# Patient Record
Sex: Female | Born: 1935 | Race: Black or African American | Hispanic: No | Smoking: Former smoker
Health system: Southern US, Community
[De-identification: ages and names within clinical notes are randomized; demographics above are authoritative.]

## PROBLEM LIST (undated history)

## (undated) DIAGNOSIS — Z923 Personal history of irradiation: Secondary | ICD-10-CM

## (undated) DIAGNOSIS — D649 Anemia, unspecified: Secondary | ICD-10-CM

## (undated) DIAGNOSIS — R918 Other nonspecific abnormal finding of lung field: Secondary | ICD-10-CM

## (undated) DIAGNOSIS — Z9221 Personal history of antineoplastic chemotherapy: Secondary | ICD-10-CM

## (undated) DIAGNOSIS — R0602 Shortness of breath: Secondary | ICD-10-CM

## (undated) DIAGNOSIS — C169 Malignant neoplasm of stomach, unspecified: Secondary | ICD-10-CM

## (undated) DIAGNOSIS — J449 Chronic obstructive pulmonary disease, unspecified: Secondary | ICD-10-CM

## (undated) DIAGNOSIS — E785 Hyperlipidemia, unspecified: Secondary | ICD-10-CM

## (undated) DIAGNOSIS — M1990 Unspecified osteoarthritis, unspecified site: Secondary | ICD-10-CM

## (undated) HISTORY — DX: Unspecified osteoarthritis, unspecified site: M19.90

## (undated) HISTORY — PX: PORTACATH PLACEMENT: SHX2246

## (undated) HISTORY — DX: Personal history of irradiation: Z92.3

## (undated) HISTORY — PX: OTHER SURGICAL HISTORY: SHX169

## (undated) HISTORY — DX: Personal history of antineoplastic chemotherapy: Z92.21

## (undated) HISTORY — DX: Anemia, unspecified: D64.9

## (undated) HISTORY — DX: Chronic obstructive pulmonary disease, unspecified: J44.9

## (undated) HISTORY — PX: CATARACT EXTRACTION: SUR2

## (undated) HISTORY — PX: CHOLECYSTECTOMY: SHX55

## (undated) HISTORY — PX: EYE SURGERY: SHX253

## (undated) HISTORY — PX: ABDOMINAL HYSTERECTOMY: SHX81

## (undated) HISTORY — DX: Malignant neoplasm of stomach, unspecified: C16.9

## (undated) HISTORY — DX: Hyperlipidemia, unspecified: E78.5

---

## 1998-08-14 ENCOUNTER — Emergency Department (HOSPITAL_COMMUNITY): Admission: EM | Admit: 1998-08-14 | Discharge: 1998-08-14 | Payer: Self-pay | Admitting: Emergency Medicine

## 1998-10-12 ENCOUNTER — Other Ambulatory Visit: Admission: RE | Admit: 1998-10-12 | Discharge: 1998-10-12 | Payer: Self-pay | Admitting: Internal Medicine

## 1999-10-10 ENCOUNTER — Inpatient Hospital Stay (HOSPITAL_COMMUNITY): Admission: AD | Admit: 1999-10-10 | Discharge: 1999-10-13 | Payer: Self-pay | Admitting: Internal Medicine

## 1999-10-31 ENCOUNTER — Ambulatory Visit (HOSPITAL_COMMUNITY): Admission: RE | Admit: 1999-10-31 | Discharge: 1999-10-31 | Payer: Self-pay | Admitting: Internal Medicine

## 2000-10-08 ENCOUNTER — Other Ambulatory Visit: Admission: RE | Admit: 2000-10-08 | Discharge: 2000-10-08 | Payer: Self-pay | Admitting: Internal Medicine

## 2000-10-14 ENCOUNTER — Encounter: Payer: Self-pay | Admitting: Internal Medicine

## 2000-10-14 ENCOUNTER — Encounter: Admission: RE | Admit: 2000-10-14 | Discharge: 2000-10-14 | Payer: Self-pay | Admitting: Internal Medicine

## 2000-10-21 ENCOUNTER — Encounter: Admission: RE | Admit: 2000-10-21 | Discharge: 2000-10-21 | Payer: Self-pay | Admitting: Internal Medicine

## 2000-10-21 ENCOUNTER — Encounter: Payer: Self-pay | Admitting: Internal Medicine

## 2001-10-25 ENCOUNTER — Encounter: Admission: RE | Admit: 2001-10-25 | Discharge: 2001-10-25 | Payer: Self-pay | Admitting: Internal Medicine

## 2001-10-25 ENCOUNTER — Encounter: Payer: Self-pay | Admitting: Internal Medicine

## 2002-10-27 ENCOUNTER — Encounter: Payer: Self-pay | Admitting: Internal Medicine

## 2002-10-27 ENCOUNTER — Encounter: Admission: RE | Admit: 2002-10-27 | Discharge: 2002-10-27 | Payer: Self-pay | Admitting: Internal Medicine

## 2003-11-15 ENCOUNTER — Encounter: Admission: RE | Admit: 2003-11-15 | Discharge: 2003-11-15 | Payer: Self-pay | Admitting: Internal Medicine

## 2003-12-27 ENCOUNTER — Ambulatory Visit (HOSPITAL_COMMUNITY): Admission: RE | Admit: 2003-12-27 | Discharge: 2003-12-27 | Payer: Self-pay | Admitting: Gastroenterology

## 2003-12-27 ENCOUNTER — Encounter (INDEPENDENT_AMBULATORY_CARE_PROVIDER_SITE_OTHER): Payer: Self-pay | Admitting: *Deleted

## 2005-01-28 ENCOUNTER — Emergency Department (HOSPITAL_COMMUNITY): Admission: EM | Admit: 2005-01-28 | Discharge: 2005-01-28 | Payer: Self-pay | Admitting: Emergency Medicine

## 2008-11-06 ENCOUNTER — Emergency Department (HOSPITAL_COMMUNITY): Admission: EM | Admit: 2008-11-06 | Discharge: 2008-11-06 | Payer: Self-pay | Admitting: Family Medicine

## 2008-12-08 ENCOUNTER — Encounter: Admission: RE | Admit: 2008-12-08 | Discharge: 2008-12-08 | Payer: Self-pay | Admitting: Internal Medicine

## 2009-12-21 ENCOUNTER — Encounter: Admission: RE | Admit: 2009-12-21 | Discharge: 2009-12-21 | Payer: Self-pay | Admitting: Internal Medicine

## 2010-08-11 LAB — HEMOCCULT GUIAC POC 1CARD (OFFICE): Fecal Occult Bld: POSITIVE

## 2010-09-20 NOTE — Op Note (Signed)
NAMEDONJA, [name]                       ACCOUNT NO.:  [id number]   MEDICAL RECORD NO.:  [id number]                   PATIENT TYPE:  AMB   LOCATION:  ENDO                                 FACILITY:  [facility]   PHYSICIAN:  Danise Edge, M.D.                DATE OF BIRTH:  Apr 10, 1931   DATE OF PROCEDURE:  12/27/2003  DATE OF DISCHARGE:                                 OPERATIVE REPORT   PROCEDURE:  Screening colonoscopy.   PROCEDURE INDICATION:  Ms. [name] is a 77 year old female, born  1931/05/02.  [name] is scheduled to undergo her first screening  colonoscopy with polypectomy to prevent colon cancer.   ENDOSCOPIST:  Danise Edge, M.D.   PREMEDICATION:  1. Demerol 60 mg.  2. Versed 5 mg.   DESCRIPTION OF PROCEDURE:  After obtaining informed consent, [name] was  placed in the left lateral decubitus position.  I administered intravenous  Demerol and intravenous Versed to achieve conscious sedation for the  procedure.  The patient's blood pressure, oxygen saturation, and cardiac  rhythm were monitored throughout the procedure and documented in the medical  record.   Anal inspection and digital rectal exam were normal.  The Olympus adjustable  pediatric colonoscope was introduced into the rectum and advanced to the  cecum.  Colonic preparation for the exam today was excellent.   RECTUM:  Normal.  SIGMOID COLON AND DESCENDING COLON:  At 60 cm from the anal verge, a  diminutive 1 mm sessile polyp was removed with the cold biopsy forceps.  SPLENIC FLEXURE:  Normal.  TRANSVERSE COLON:  Normal.  HEPATIC FLEXURE:  Normal.  ASCENDING COLON:  Normal.  CECUM AND ILEOCECAL VALVE:  From the distal cecum, a 1 cm flat polyp was  lifted by submucosal saline injection and removed in piecemeal fashion with  the electrocautery snare.   ASSESSMENT:  A 1 cm flat polyp was removed from the distal cecum with the  electrocautery snare, and a diminutive 1 mm polyp  removed from the left  colon at 60 cm from the anal verge.                                               Danise Edge, M.D.    MJ/MEDQ  D:  12/27/2003  T:  12/27/2003  Job:  [id number]   cc:   [name], M.D.  [address]  [place]  [place] [address]  Fax: [phone]

## 2010-09-20 NOTE — H&P (Signed)
[place]  Patient:    [name], [name]                      MRN: [id number] Attending:  Norva Pavlov, M.D.                         History and Physical  CHIEF COMPLAINT:  Shortness of breath for last one week.  HISTORY OF PRESENT ILLNESS:  Ms. Costen is a 77 year old African-American female patient with a history of tobacco use although she has quit smoking since January of this year and hypertension who has been feeling poorly since last week. She visited [place] two weeks back and felt "cold" on the bus. For the last one week, she has had cough productive of clear to white sputum and dyspnea on exertion which is progressively getting worse. No orthopnea or PND. Dyspnea and cough kept her up at night, and she decided to call the office today. She denies fever, chills, but stays "hot". No anorexia, weight loss, hemoptysis, or night sweats. She has been wheezing and denies any history of asthma. She does have nasal congestion and post nasal drip. No sinus congestion. She did try Tavist-1 and Robitussin DM with not much relief.  PAST MEDICAL HISTORY:  Significant for: 1. Hypertension. 2. Tobacco use. 3. Post menopausal and has refused HRT. 4. Probable tarsal tunnel syndrome.  PAST SURGICAL HISTORY:  Significant for: 1. Complete hysterectomy with bilateral salpingo-oophorectomy secondary to    fibroid uterus in 1973. 2. Cholecystectomy in 1981. 3. Lipoma removal in 1989. 4. Cataract extraction with IOL in the right eye by Robert L. Dione Booze, M.D.  INJURIES: 1. Fracture of the left arm in 1969. 2. Had an injury of the left foot after a vacuum fell on her last year.  FAMILY HISTORY:  Father died at 65 of pneumonia. Mother 38 has hypertension and atrial fibrillation. Brothers 55, 56, 58, and 59; they are all healthy.  SOCIAL HISTORY:  She has been a smoker, smoking about a pack a day for the last 40 years. Did quit in January of this  year after being tried on Nicotrol inhaler and Zyban. Alcohol:  Two glasses of whiskey a week. Caffeine:  Two to four a day. Recreational drugs:  None. She is married, does not have any children. Job:  She is retired, worked in the Web designer at [employer].  MENSTRUAL HISTORY:  She is postmenopausal who has refused estrogen replacement treatment because of fear of breast cancer and was tried on Evista which she discontinued taking on her own.  REVIEW OF SYSTEMS:  All other systems are unremarkable.  PHYSICAL EXAMINATION:  VITAL SIGNS:  Height is 4 feet, 11 1/4 inches; weight 125, temperature 98.1, pulse is 80 a minute, blood pressure 120/84 right arm sitting, oxygen saturations were 80% on room air.  GENERAL:  Appears in mild respiratory distress.  HEENT:  EOMs are intact. Left early cataract, right eye has an intraocular lens. Ear exam unremarkable. Oropharyngeal mucosa is normal. No erythema or exudate.  NECK:  Supple. No JVD. No enlarged lymphadenopathy or thyromegaly.  CHEST:  With coarse breath sounds with expiratory wheezing bilateral.  CARDIOVASCULAR:  Regular rate and rhythm. No murmur, gallop, rub, or click.  ABDOMEN:  Soft, nontender, nondistended. Bowel sounds are good. No hepatosplenomegaly.  EXTREMITIES:  With clubbing, cyanosis, or edema. Distal pulses are good.  SKIN:  Without any rash.  PELVIC/BREASTS/RECTAL:  Deferred.  LABORATORY DATA:  Pending.  Chest x-ray PA and lateral done in the office was read by the radiologist in [facility] and showed emphysematous lungs with chronic and maybe acute on chronic bronchitic changes. No infiltrate, mass, or effusion.  ASSESSMENT:  Cough with dyspnea. Most likely this is chronic obstructive pulmonary disease exacerbation although Ms. Legaspi has never had formal pulmonary function tests with lung volume done in the past and has been told that she has chronic bronchitis. She was a heavy smoker who has quit smoking only in  January of this year after a 40-pack-year history. In the office, she was given albuterol nebulizer 2.5 x 2 and oxygen saturations were 84% after these breathing treatments. She was put on oxygen supplements, and the decision was made to hospitalize her.  PLAN:  Extra oxygen supplementation to keep her saturations above 93, IV steroids, IV bronchodilators, monitor her peak flows before and after the bronchodilator treatments, empiric antibiotic treatment with IV Levaquin, switch her steroids and antibiotics to p.o. as soon as she becomes better. She will need pulmonary function tests with lung volumes and ______ once she isstable.DD:  10/10/99 TD:  10/10/99 Job: [id number]

## 2010-09-20 NOTE — Discharge Summary (Signed)
[place]  Patient:    [name], [name]                    MRN: [id number] Adm. Date:  [id number] Disc. Date: [id number] Attending:  Dutch Gray Dictator:   Thora Lance, M.D.                           Discharge Summary  REASON FOR ADMISSION:  This is a 77 year old African-American female with a history of tobacco use and hypertension who had been feeling poorly for a week.  She had complained of a cough productive of clear to white sputum, and dyspnea on exertion which has progressively gotten worse.  She denied any fevers or chills, weight loss, hemoptysis, anorexia, or night sweats.  She had complained of wheezing, but denies any history of asthma.  She had some nasal congestion with postnasal drip.  She has a 40 pack year history of smoking, and quit in January 2001.  PHYSICAL EXAMINATION:  GENERAL:  She appeared to be in mild respiratory distress at rest.  VITAL SIGNS:  Blood pressure was 120/84, heart rate was 80, temperature 98.1, oxygen saturation was 80% on room air.  CHEST:  Coarse breath sounds with expiratory wheezing bilaterally.  The rest of her examination was unremarkable.  LABORATORY DATA:  Chest x-ray showed emphysematous changes which are chronic, and there may some acute on chronic bronchitic changes.  There was no infiltrate, effusion, or mass.  CBC showed white blood cell count of 5.9, hemoglobin 15.7, platelet count 212 with a normal diff.  Arterial blood gas on room air pH 7.36, PCO2 49, PO2 65.  CMET not available to me at the time of this dictation.  EKG showed normal sinus rhythm, some nonspecific inferolateral T-wave changes.  HOSPITAL COURSE:  Problem #1 - CHRONIC OBSTRUCTIVE PULMONARY DISEASE EXACERBATION:  The patient was admitted and placed on intravenous steroids, intravenous Levaquin, albuterol and Atrovent nebulizers, guaifenesin, and oxygen.  By the second hospital day she felt much better.  Her  intravenous fluids were discontinued, she was taking p.o., and she was switched from intravenous to oral Levaquin.  Her Solu-Medrol dose was decreased.  By the second hospital day she continued to improve with less cough and less shortness of breath.  Her steroids were switched from intravenous to oral. Her oxygen was weaned, and on the day of discharge her oxygen saturation was 94% on room air.  Her wheezing had improved.  She was discharged in good condition.  Problem #2 - HYPERTENSION:  She remained on her hydrochlorothiazide throughout the hospitalization, and her blood pressures were normal.  DISCHARGE DIAGNOSES: 1. Chronic obstructive pulmonary disease exacerbation. 2. Hypertension.  PROCEDURES:  None.  DISCHARGE MEDICATIONS: 1. Levaquin 500 mg p.o. q.d. x 5 days. 2. Prednisone 50 mg q.d. one day, 40 mg q.d. two days, 30 mg q.d. two days,    20 mg q.d. two days, 10 mg q.d. two days, and then discontinue. 3. Albuterol metered dose inhaler with aerochamber 2 puffs q.i.d. 4. Guaifenesin 1200 mg one b.i.d. x 5 days. 5. Hydrochlorothiazide 12.5 mg q.d.  ACTIVITY:  No restrictions.  DIET:  No restrictions.  FOLLOWUP:  Approximately ten days with Dr. Baron Hamper.  The patient has already discontinued smoking.  We may want to obtain pulmonary function tests as an outpatient after this is cleared. DD:  10/13/99 TD:  10/16/99 Job: [id number]

## 2010-09-23 ENCOUNTER — Observation Stay (HOSPITAL_COMMUNITY)
Admission: EM | Admit: 2010-09-23 | Discharge: 2010-09-25 | Disposition: A | Discharge status: Home / Self Care | Payer: Medicare Other | Attending: Internal Medicine | Admitting: Internal Medicine

## 2010-09-23 ENCOUNTER — Inpatient Hospital Stay (INDEPENDENT_AMBULATORY_CARE_PROVIDER_SITE_OTHER)
Admission: RE | Admit: 2010-09-23 | Discharge: 2010-09-23 | Disposition: A | Discharge status: Home / Self Care | Payer: Medicare Other | Source: Ambulatory Visit | Attending: Family Medicine | Admitting: Family Medicine

## 2010-09-23 DIAGNOSIS — D500 Iron deficiency anemia secondary to blood loss (chronic): Secondary | ICD-10-CM | POA: Insufficient documentation

## 2010-09-23 DIAGNOSIS — R195 Other fecal abnormalities: Secondary | ICD-10-CM | POA: Insufficient documentation

## 2010-09-23 DIAGNOSIS — J449 Chronic obstructive pulmonary disease, unspecified: Secondary | ICD-10-CM | POA: Insufficient documentation

## 2010-09-23 DIAGNOSIS — E785 Hyperlipidemia, unspecified: Secondary | ICD-10-CM | POA: Insufficient documentation

## 2010-09-23 DIAGNOSIS — Z79899 Other long term (current) drug therapy: Secondary | ICD-10-CM | POA: Insufficient documentation

## 2010-09-23 DIAGNOSIS — J4489 Other specified chronic obstructive pulmonary disease: Secondary | ICD-10-CM | POA: Insufficient documentation

## 2010-09-23 DIAGNOSIS — M1990 Unspecified osteoarthritis, unspecified site: Secondary | ICD-10-CM | POA: Insufficient documentation

## 2010-09-23 DIAGNOSIS — D131 Benign neoplasm of stomach: Principal | ICD-10-CM | POA: Insufficient documentation

## 2010-09-23 DIAGNOSIS — D649 Anemia, unspecified: Secondary | ICD-10-CM

## 2010-09-23 DIAGNOSIS — R42 Dizziness and giddiness: Secondary | ICD-10-CM

## 2010-09-23 DIAGNOSIS — R933 Abnormal findings on diagnostic imaging of other parts of digestive tract: Secondary | ICD-10-CM | POA: Insufficient documentation

## 2010-09-23 LAB — POCT I-STAT, CHEM 8
Chloride: 106 mEq/L (ref 96–112)
Glucose, Bld: 89 mg/dL (ref 70–99)
HCT: 20 % — ABNORMAL LOW (ref 36.0–46.0)
Hemoglobin: 6.8 g/dL — CL (ref 12.0–15.0)
Potassium: 3.8 mEq/L (ref 3.5–5.1)
Sodium: 139 mEq/L (ref 135–145)

## 2010-09-23 LAB — ABO/RH: ABO/RH(D): O POS

## 2010-09-24 ENCOUNTER — Other Ambulatory Visit: Payer: Self-pay | Admitting: Gastroenterology

## 2010-09-24 ENCOUNTER — Inpatient Hospital Stay (HOSPITAL_COMMUNITY): Payer: Medicare Other

## 2010-09-24 ENCOUNTER — Ambulatory Visit (HOSPITAL_COMMUNITY)
Admission: RE | Admit: 2010-09-24 | Discharge: 2010-09-24 | Disposition: A | Discharge status: Home / Self Care | Payer: Medicare Other | Source: Ambulatory Visit | Attending: Gastroenterology | Admitting: Gastroenterology

## 2010-09-24 ENCOUNTER — Ambulatory Visit (HOSPITAL_COMMUNITY): Admission: RE | Admit: 2010-09-24 | Payer: Medicare Other | Source: Ambulatory Visit | Admitting: Gastroenterology

## 2010-09-24 LAB — BASIC METABOLIC PANEL
Calcium: 8.7 mg/dL (ref 8.4–10.5)
Creatinine, Ser: 0.94 mg/dL (ref 0.4–1.2)
GFR calc Af Amer: >60 mL/min (ref >60)
GFR calc non Af Amer: 58 mL/min — ABNORMAL LOW (ref >60)
Sodium: 140 mEq/L (ref 135–145)

## 2010-09-24 LAB — HEMOGLOBIN AND HEMATOCRIT, BLOOD: HCT: 24.2 % — ABNORMAL LOW (ref 36.0–46.0)

## 2010-09-24 LAB — PREPARE RBC (CROSSMATCH)

## 2010-09-25 LAB — DIFFERENTIAL
Basophils Absolute: 0 10*3/uL (ref 0.0–0.1)
Basophils Relative: 0 % (ref 0–1)
Eosinophils Absolute: 0.3 10*3/uL (ref 0.0–0.7)
Neutro Abs: 5 10*3/uL (ref 1.7–7.7)
Neutrophils Relative %: 65 % (ref 43–77)

## 2010-09-25 LAB — BASIC METABOLIC PANEL
Chloride: 109 mEq/L (ref 96–112)
GFR calc non Af Amer: 58 mL/min — ABNORMAL LOW (ref >60)
Glucose, Bld: 98 mg/dL (ref 70–99)
Potassium: 4.3 mEq/L (ref 3.5–5.1)
Sodium: 142 mEq/L (ref 135–145)

## 2010-09-25 LAB — CBC
HCT: 32.8 % — ABNORMAL LOW (ref 36.0–46.0)
Hemoglobin: 10.7 g/dL — ABNORMAL LOW (ref 12.0–15.0)
Hemoglobin: 11 g/dL — ABNORMAL LOW (ref 12.0–15.0)
MCH: 28.6 pg (ref 26.0–34.0)
Platelets: 268 10*3/uL (ref 150–400)
RBC: 3.8 MIL/uL — ABNORMAL LOW (ref 3.87–5.11)
RBC: 3.85 MIL/uL — ABNORMAL LOW (ref 3.87–5.11)
RDW: 16.4 % — ABNORMAL HIGH (ref 11.5–15.5)
WBC: 7.7 10*3/uL (ref 4.0–10.5)
WBC: 7.9 10*3/uL (ref 4.0–10.5)

## 2010-09-25 LAB — CROSSMATCH
ABO/RH(D): O POS
Antibody Screen: NEGATIVE
Unit division: 0

## 2010-09-25 MED ORDER — IOHEXOL 300 MG/ML  SOLN
100.0000 mL | Freq: Once | INTRAMUSCULAR | Status: AC | PRN
  Administered 2010-09-25: 100 mL via INTRAVENOUS

## 2010-09-26 NOTE — Discharge Summary (Signed)
  NAMEAUDRIS, [name]             ACCOUNT NO.:  [id number]  MEDICAL RECORD NO.:  [id number]           PATIENT TYPE:  I  LOCATION:  6710                         FACILITY:  [facility]  PHYSICIAN:  [name], MD      DATE OF BIRTH:  March 15, 1931  DATE OF ADMISSION:  09/23/2010 DATE OF DISCHARGE:  09/25/2010                              DISCHARGE SUMMARY   DISCHARGE DIAGNOSES: 1. Severe anemia. 2. 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  Differential includes GIST versus adenocarcinoma. 3. History of dyslipidemia. 4. History of osteoarthritis.  MEDICATION ON DISCHARGE: 1. Mevacor 20 mg daily. 2. Hydroxyzine 10 mg p.r.n. for itching. 3. Calcium with vitamin D daily. 4. Vitamin D 1000 units daily. 5. Fish oil 1200 mg capsule one daily. 6. Multivitamin one daily. 7. Vitamin B12 1000 mcg daily. 8. Iron 1 tablet 325 mg daily.  ALLERGIES:  None.  CONSULTATION:  GI.  PROCEDURE:  EGD which revealed upper abdomen and GE junction gastric mass on the EGD and pathology is pending.  Surgical consultation with Dr. Violeta Gelinas.  LABORATORY DATA:  CT of the chest, abdomen, pelvic negative for metastatic disease.  Iron studies were pending.  Blood chemistries, sodium 142, potassium 4.3, creatinine 0.9.  Hemoglobin 10.7, white count of 7.9.  HOSPITAL COURSE:  A 77 year old female admitted with some dizziness and feeling weak, found to have hemoglobin of 6.  The patient with history of severe anemia admitted.  She had guaiac-positive stools and some dark stools and she got 2 units of blood transfusion, hemoglobin went up to 8 from 6.  She started feeling whole lot better.  Blood pressure remained stable.  Hemodynamically stable without any abdominal pain or vomiting or dysphagia.  GI consult was obtained.  She underwent an EGD which revealed and large gastric tumor on the upper abdomen and the GE junction.  Pathology is pending.  The patient  did require 2 more units to get her hemoglobin up to 11 and subsequently it came down to 10.7. She remained hemodynamically stable.  No evidence of any dysphagia, abdominal pain or vomiting.  The patient was started back on her regular diet.  Surgical consult was obtained.  The patient will be followed up outpatient once the pathology is back to discuss about surgical options. CT scan of the chest, abdomen, pelvic was obtained which did not show any evidence of any other metastatic disease and as far as the patient will continue on iron supplements as far as history of other medical issues including her dyslipidemia and mild COPD, continue on her current medications as per her home use.  I will see her back in 1 week, discharged home.     [name], MD     KH/MEDQ  D:  09/25/2010  T:  09/25/2010  Job:  [id number]  Electronically Signed by [name] MD on 09/26/2010 08:58:08 AM

## 2010-10-03 NOTE — Op Note (Signed)
  [name], [name]             ACCOUNT NO.:  [id number]  MEDICAL RECORD NO.:  [id number]           PATIENT TYPE:  O  LOCATION:  ENDO                         [facility]AC[facility]:  [facility]  PHYSICIAN:  Danise Edge, M.D.   DATE OF BIRTH:  1930/09/23  DATE OF PROCEDURE:  09/24/2010 DATE OF DISCHARGE:                              OPERATIVE REPORT   REFERRING PHYSICIAN:  [name], MD  PROCEDURE:  Diagnostic esophagogastroduodenoscopy.  HISTORY:  [name] is a 77 year old female born on November 25, 1930.  The patient was admitted to evaluate gastrointestinal bleeding, manifested by the passage of melenic stool and unassociated with nausea, vomiting, hematemesis, or history of peptic ulcer disease. She does not use nonsteroidal anti-inflammatory medication.  ENDOSCOPIST:  Danise Edge, MD  PREMEDICATION:  Fentanyl 50 mcg, Versed 2.5 mg.  PROCEDURE IN DETAIL:  The patient was placed in the left lateral decubitus position.  The Pentax gastroscope was passed through the posterior hypopharynx into the proximal esophagus without difficulty. The hypopharynx, larynx, and vocal cords appeared normal.  Esophagoscopy:  The proximal mid and lower segments of the esophagus appear normal.  Gastroscopy:  Retroflexed view of the gastric cardia reveals a large tumor starting at the esophagogastric junction which is approximately 40 cm from the incisor teeth and filling the gastric cardia.  Biopsies were performed.  The gastric fundus, body, antrum, and pylorus appeared normal.  Duodenoscopy:  The duodenal bulb and descending duodenum appeared normal.  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  There is no evidence for the presence of Barrett esophagus.  Biopsies were performed to rule out a gastrointestinal stromal tumor, adenocarcinoma, neoplastic polyp in the gastric cardia.           ______________________________ Danise Edge, M.D.     MJ/MEDQ  D:  09/24/2010  T:  09/24/2010  Job:  [id number]  cc:   [name], MD Fax: [phone]  Electronically Signed by Danise Edge M.D. on 10/03/2010 56:21:30 PM

## 2010-10-28 ENCOUNTER — Telehealth (INDEPENDENT_AMBULATORY_CARE_PROVIDER_SITE_OTHER): Payer: Self-pay

## 2010-10-28 ENCOUNTER — Other Ambulatory Visit (INDEPENDENT_AMBULATORY_CARE_PROVIDER_SITE_OTHER): Payer: Self-pay | Admitting: General Surgery

## 2010-10-28 ENCOUNTER — Ambulatory Visit
Admission: RE | Admit: 2010-10-28 | Discharge: 2010-10-28 | Disposition: A | Discharge status: Home / Self Care | Payer: Medicare Other | Source: Ambulatory Visit | Attending: General Surgery | Admitting: General Surgery

## 2010-10-28 DIAGNOSIS — R1110 Vomiting, unspecified: Secondary | ICD-10-CM

## 2010-10-28 LAB — MAGNESIUM: Magnesium: 2.3 mg/dL (ref 1.5–2.5)

## 2010-10-28 LAB — PHOSPHORUS: Phosphorus: 3.8 mg/dL (ref 2.3–4.6)

## 2010-10-28 LAB — BASIC METABOLIC PANEL
Glucose, Bld: 77 mg/dL (ref 70–99)
Potassium: 4 mEq/L (ref 3.5–5.3)
Sodium: 140 mEq/L (ref 135–145)

## 2010-10-28 LAB — CBC WITH DIFFERENTIAL/PLATELET
Basophils Relative: 0 % (ref 0–1)
Hemoglobin: 7.2 g/dL — ABNORMAL LOW (ref 12.0–15.0)
MCHC: 33.2 g/dL (ref 30.0–36.0)
Monocytes Relative: 9 % (ref 3–12)
Neutro Abs: 5.1 10*3/uL (ref 1.7–7.7)
Neutrophils Relative %: 65 % (ref 43–77)
RBC: 2.42 MIL/uL — ABNORMAL LOW (ref 3.87–5.11)
WBC: 7.9 10*3/uL (ref 4.0–10.5)

## 2010-10-28 NOTE — Telephone Encounter (Signed)
Trying to move up surgery.  Pt's hemaglobin is low.  If lightheaded, she may need to get transfusion.  We will get sooner surgery date and call pt.  Stay on liquid diet.

## 2010-10-28 NOTE — Telephone Encounter (Signed)
Patient called and states her stomach surgery is July 18th.  She has been vomiting for 4 days and when she tries to swallow something it won't go down.  She has had no solid foods for 4 days.  Her stomach hurts when she vomits.  She is afraid she won't make it until her surgery date.  I spoke to Dr Donell Beers and she gave an order for UGI today and BMET with magnesium and phosphorus and a CBC.

## 2010-10-29 ENCOUNTER — Telehealth (INDEPENDENT_AMBULATORY_CARE_PROVIDER_SITE_OTHER): Payer: Self-pay

## 2010-10-29 NOTE — Telephone Encounter (Signed)
I called pt per Dr Arita Miss request and told her the labs showed anemia.  She may need blood prior to surgery but her surgery will probably be moved up from July 18th.  She needs to stay on liquids and ensure.  Pt was notified to call if has any lightheadedness and dizziness.

## 2010-11-01 ENCOUNTER — Other Ambulatory Visit (INDEPENDENT_AMBULATORY_CARE_PROVIDER_SITE_OTHER): Payer: Self-pay | Admitting: General Surgery

## 2010-11-01 ENCOUNTER — Encounter (HOSPITAL_COMMUNITY): Payer: Medicare Other

## 2010-11-01 LAB — BASIC METABOLIC PANEL
CO2: 25 mEq/L (ref 19–32)
Chloride: 105 mEq/L (ref 96–112)
Glucose, Bld: 103 mg/dL — ABNORMAL HIGH (ref 70–99)
Potassium: 5.2 mEq/L — ABNORMAL HIGH (ref 3.5–5.1)
Sodium: 138 mEq/L (ref 135–145)

## 2010-11-01 LAB — CBC
Hemoglobin: 7.1 g/dL — ABNORMAL LOW (ref 12.0–15.0)
MCH: 28.5 pg (ref 26.0–34.0)
RBC: 2.49 MIL/uL — ABNORMAL LOW (ref 3.87–5.11)

## 2010-11-01 LAB — SURGICAL PCR SCREEN: Staphylococcus aureus: NEGATIVE

## 2010-11-01 NOTE — Consult Note (Signed)
NAMELAURELLE, [name]             ACCOUNT NO.:  [id number]  MEDICAL RECORD NO.:  [id number]           PATIENT TYPE:  O  LOCATION:  ENDO                         [facility]ACILITY:  [facility]  PHYSICIAN:  Gabrielle Dare. Janee Morn, M.D.DATE OF BIRTH:  02-20-1931  DATE OF CONSULTATION:  09/25/2010 DATE OF DISCHARGE:  09/24/2010                                CONSULTATION   REQUESTING PHYSICIAN:  [name], MD  CONSULTING GASTROENTEROLOGIST:  Danise Edge, MD  REASON FOR CONSULTATION:  Gastric mass.  HISTORY OF PRESENT ILLNESS:  [name] is a pleasant 77 year old African American female who was in her usual state of health until about 4-5 days ago she started noticing some black stools.  Again, this did not affect her appetite.  She was not having any pain.  No nausea, vomiting, no hematemesis.  However, after about the fourth or fifth day of noticing the stools she started feeling a little bit dizzy as well. She presented to the [facility] where she was found to have a hemoglobin of 6.8 and heme-positive stool.  Therefore, she was transferred to the emergency department for further workup.  Again, she denies any chest pain, shortness of breath.  She denies any significant associated symptoms of fever, chills.  She has not had any weight loss, she denies any dysuria or hematuria.  She does have a prior history of colon polyps and has had multiple colonoscopies in the past with benign polypectomies.  She has since been admitted and was been transfused from a hemoglobin of 6.8 is now currently at 10.7.  She was consulted on by Gastroenterology and is now found to have a large gastric tumor that has been biopsied.  They have requested surgical consultation for options.  PAST MEDICAL HISTORY: 1. Anemia. 2. Colon polyps.  PAST SURGICAL HISTORY: 1. Hysterectomy. 2. Cholecystectomy. 3. Multiple colonoscopies.  FAMILY HISTORY:  No prior family history of malignancy  or gastrointestinal disorders.  SOCIAL HISTORY:  The patient lives alone.  She is retired from [employer].  She denies any tobacco or illicit drug use.  She has an occasional glass of wine.  MEDICATIONS:  Ventolin, Systane eye lubricants, Zegerid over-the-counter once daily, Colace 1 tablet twice daily as needed, fish oil, vitamin B12, multivitamins, calcium plus D, ferrous sulfate, hydroxyzine 10 mg daily as needed and lovastatin 20 mg daily.  ALLERGIES:  No known drug or latex allergies.  REVIEW OF SYSTEMS:  Please see history of present illness for pertinent findings otherwise complete 12 system review found negative.  PHYSICAL EXAMINATION:  GENERAL APPEARANCE:  A 77 year old female who has been well-developed, well-nourished, otherwise, in no acute distress. CURRENT VITALS:  Temperature 99.8, heart rate of 93, respiratory rate of 18, blood pressure of 115/73, oxygen saturation 93% on room air. ENT:  Unremarkable. NECK:  Supple without lymphadenopathy.  Trachea is midline.  No thyromegaly or masses. LUNGS:  Clear to auscultation.  No wheezes, rhonchi or rales.  Normal respiratory effort without use of accessory muscles. HEART:  Regular rate and rhythm.  No murmurs, gallops or rubs.  Carotids 2+ and brisk without bruits.  Peripheral pulses  intact and symmetrical. ABDOMEN:  Soft, nondistended.  Surgical scars noted.  No mass effect. No hernias.  No organomegaly is appreciated.  Normal bowel sounds are auscultated.  RECTAL:  Deferred. GENITOURINARY:  Deferred. EXTREMITIES:  Good active range of motion of all extremities without crepitus or pain.  Normal muscle strength and tone without atrophy. SKIN:  Otherwise, warm and dry with good turgor.  No rashes, lesions, nodules noted. NEUROLOGICAL:  The patient is alert and oriented x3 and has appropriate affect.  DIAGNOSTICS:  Most recent labs show a white blood cell count of 7.9, hemoglobin up to 10.7 from an  admission level of 6.8, hematocrit of 32.8, platelet count of 282.  Metabolic panel shows sodium of 142, potassium of 4.3, chloride of 109, CO2 of 29, BUN of 11, creatinine of 0.9, glucose of 98.  No liver enzymes drawn.  IMAGING:  CT scan of the chest, abdomen and pelvis performed today shows a questionable 4-mm nodule in the right apex which is suggestive of interstitial process and less likely  metastatic.  Otherwise, unremarkable CT of the abdomen and pelvis.  5.5 x 4 cm mass in the proximal stomach near the GE junction.  There is a small gastrohepatic lymph node but no other significant lymphadenopathy.  No additional findings or evidence of obvious metastases are seen.  Endoscopy report and pictures reviewed, pathology is still pending.  IMPRESSION: 1. Gastric tumor appearance looks more malignant than benign at the     gastric cardia/gastrointestinal junction by endoscopy and CT. 2. Anemia secondary to gastric tumor.  RECOMMENDATIONS:  We will await the pathology.  I have briefly discussed surgical options with the patient pending path.  We will review her CT scans and we will discuss with Dr. Violeta Gelinas regarding possible surgical intervention though at the present time the patient's hemoglobin is stable.  She is tolerating regular diet and is asymptomatic.  We could continue to see the patient and discuss surgical options as an outpatient.     Brayton El, PA-C   ______________________________ Gabrielle Dare. Janee Morn, M.D.    KB/MEDQ  D:  09/25/2010  T:  09/25/2010  Job:  [id number]  cc:   [name], MD  Electronically Signed by Brayton El  on 10/28/2010 02:32:04 PM Electronically Signed by Violeta Gelinas M.D. on 11/01/2010 24:40:10 PM

## 2010-11-03 HISTORY — PX: OTHER SURGICAL HISTORY: SHX169

## 2010-11-08 ENCOUNTER — Other Ambulatory Visit (INDEPENDENT_AMBULATORY_CARE_PROVIDER_SITE_OTHER): Payer: Self-pay | Admitting: General Surgery

## 2010-11-08 ENCOUNTER — Inpatient Hospital Stay (HOSPITAL_COMMUNITY)
Admission: RE | Admit: 2010-11-08 | Discharge: 2010-11-23 | DRG: 327 | Disposition: A | Discharge status: Home Health | Payer: Medicare Other | Source: Ambulatory Visit | Attending: General Surgery | Admitting: General Surgery

## 2010-11-08 DIAGNOSIS — K920 Hematemesis: Secondary | ICD-10-CM | POA: Diagnosis not present

## 2010-11-08 DIAGNOSIS — E7800 Pure hypercholesterolemia, unspecified: Secondary | ICD-10-CM | POA: Diagnosis present

## 2010-11-08 DIAGNOSIS — K219 Gastro-esophageal reflux disease without esophagitis: Secondary | ICD-10-CM | POA: Diagnosis present

## 2010-11-08 DIAGNOSIS — J90 Pleural effusion, not elsewhere classified: Secondary | ICD-10-CM | POA: Diagnosis present

## 2010-11-08 DIAGNOSIS — D500 Iron deficiency anemia secondary to blood loss (chronic): Secondary | ICD-10-CM | POA: Diagnosis present

## 2010-11-08 DIAGNOSIS — J4489 Other specified chronic obstructive pulmonary disease: Secondary | ICD-10-CM | POA: Diagnosis present

## 2010-11-08 DIAGNOSIS — C160 Malignant neoplasm of cardia: Principal | ICD-10-CM | POA: Diagnosis present

## 2010-11-08 DIAGNOSIS — C169 Malignant neoplasm of stomach, unspecified: Secondary | ICD-10-CM

## 2010-11-08 DIAGNOSIS — R0902 Hypoxemia: Secondary | ICD-10-CM | POA: Diagnosis not present

## 2010-11-08 DIAGNOSIS — D481 Neoplasm of uncertain behavior of connective and other soft tissue: Secondary | ICD-10-CM | POA: Diagnosis present

## 2010-11-08 DIAGNOSIS — E46 Unspecified protein-calorie malnutrition: Secondary | ICD-10-CM | POA: Diagnosis present

## 2010-11-08 DIAGNOSIS — D62 Acute posthemorrhagic anemia: Secondary | ICD-10-CM | POA: Diagnosis not present

## 2010-11-08 DIAGNOSIS — R042 Hemoptysis: Secondary | ICD-10-CM | POA: Diagnosis present

## 2010-11-08 DIAGNOSIS — D4819 Other specified neoplasm of uncertain behavior of connective and other soft tissue: Secondary | ICD-10-CM | POA: Diagnosis present

## 2010-11-08 DIAGNOSIS — J449 Chronic obstructive pulmonary disease, unspecified: Secondary | ICD-10-CM | POA: Diagnosis present

## 2010-11-08 DIAGNOSIS — C772 Secondary and unspecified malignant neoplasm of intra-abdominal lymph nodes: Secondary | ICD-10-CM | POA: Diagnosis present

## 2010-11-08 HISTORY — DX: Malignant neoplasm of stomach, unspecified: C16.9

## 2010-11-08 LAB — BASIC METABOLIC PANEL
CO2: 24 mEq/L (ref 19–32)
Glucose, Bld: 157 mg/dL — ABNORMAL HIGH (ref 70–99)
Potassium: 4.2 mEq/L (ref 3.5–5.1)
Sodium: 139 mEq/L (ref 135–145)

## 2010-11-08 LAB — ABO/RH: ABO/RH(D): O POS

## 2010-11-08 LAB — PHOSPHORUS: Phosphorus: 4.1 mg/dL (ref 2.3–4.6)

## 2010-11-08 LAB — CBC
Hemoglobin: 9.8 g/dL — ABNORMAL LOW (ref 12.0–15.0)
RBC: 3.47 MIL/uL — ABNORMAL LOW (ref 3.87–5.11)

## 2010-11-09 LAB — TYPE AND SCREEN

## 2010-11-09 LAB — BASIC METABOLIC PANEL
BUN: 9 mg/dL (ref 6–23)
Chloride: 106 mEq/L (ref 96–112)
GFR calc Af Amer: >60 mL/min (ref >60)
Glucose, Bld: 134 mg/dL — ABNORMAL HIGH (ref 70–99)
Potassium: 4.2 mEq/L (ref 3.5–5.1)

## 2010-11-09 LAB — CBC
HCT: 31.5 % — ABNORMAL LOW (ref 36.0–46.0)
Hemoglobin: 9.8 g/dL — ABNORMAL LOW (ref 12.0–15.0)
WBC: 7.7 10*3/uL (ref 4.0–10.5)

## 2010-11-10 LAB — COMPREHENSIVE METABOLIC PANEL
ALT: 89 U/L — ABNORMAL HIGH (ref 0–35)
AST: 46 U/L — ABNORMAL HIGH (ref 0–37)
Alkaline Phosphatase: 84 U/L (ref 39–117)
CO2: 25 mEq/L (ref 19–32)
Chloride: 103 mEq/L (ref 96–112)
Creatinine, Ser: 0.67 mg/dL (ref 0.50–1.10)
GFR calc non Af Amer: >60 mL/min (ref >60)
Potassium: 4 mEq/L (ref 3.5–5.1)
Total Bilirubin: 0.4 mg/dL (ref 0.3–1.2)

## 2010-11-10 LAB — CBC
MCV: 89.9 fL (ref 78.0–100.0)
Platelets: 234 10*3/uL (ref 150–400)
RBC: 3.46 MIL/uL — ABNORMAL LOW (ref 3.87–5.11)
WBC: 8.6 10*3/uL (ref 4.0–10.5)

## 2010-11-11 ENCOUNTER — Inpatient Hospital Stay (HOSPITAL_COMMUNITY): Payer: Medicare Other

## 2010-11-11 DIAGNOSIS — D649 Anemia, unspecified: Secondary | ICD-10-CM

## 2010-11-11 DIAGNOSIS — E46 Unspecified protein-calorie malnutrition: Secondary | ICD-10-CM

## 2010-11-11 LAB — CBC
MCH: 28.5 pg (ref 26.0–34.0)
MCHC: 31.4 g/dL (ref 30.0–36.0)
Platelets: 235 10*3/uL (ref 150–400)
RBC: 3.09 MIL/uL — ABNORMAL LOW (ref 3.87–5.11)
RDW: 16.4 % — ABNORMAL HIGH (ref 11.5–15.5)

## 2010-11-11 LAB — COMPREHENSIVE METABOLIC PANEL
ALT: 58 U/L — ABNORMAL HIGH (ref 0–35)
AST: 25 U/L (ref 0–37)
Albumin: 2 g/dL — ABNORMAL LOW (ref 3.5–5.2)
Calcium: 8.3 mg/dL — ABNORMAL LOW (ref 8.4–10.5)
Creatinine, Ser: 0.62 mg/dL (ref 0.50–1.10)
Sodium: 138 mEq/L (ref 135–145)
Total Protein: 5 g/dL — ABNORMAL LOW (ref 6.0–8.3)

## 2010-11-12 LAB — BASIC METABOLIC PANEL
BUN: 5 mg/dL — ABNORMAL LOW (ref 6–23)
CO2: 27 mEq/L (ref 19–32)
Calcium: 8.6 mg/dL (ref 8.4–10.5)
GFR calc non Af Amer: >60 mL/min (ref >60)
Glucose, Bld: 116 mg/dL — ABNORMAL HIGH (ref 70–99)

## 2010-11-12 LAB — CBC
HCT: 26.3 % — ABNORMAL LOW (ref 36.0–46.0)
Hemoglobin: 8.1 g/dL — ABNORMAL LOW (ref 12.0–15.0)
MCH: 27.9 pg (ref 26.0–34.0)
MCHC: 30.8 g/dL (ref 30.0–36.0)
RBC: 2.9 MIL/uL — ABNORMAL LOW (ref 3.87–5.11)

## 2010-11-12 NOTE — Op Note (Signed)
NAMEJACQUALINE, [name]             ACCOUNT NO.:  [id number]  MEDICAL RECORD NO.:  [id number]  LOCATION:  0008                         FACILITY:  [facility]  PHYSICIAN:  Almond Lint, MD       DATE OF BIRTH:  [birth year]  DATE OF PROCEDURE:  11/08/2010 DATE OF DISCHARGE:                              OPERATIVE REPORT   PREOPERATIVE DIAGNOSIS:  Gastric adenoma.  POSTOPERATIVE DIAGNOSIS:  GE junction carcinoma.  PROCEDURE:  Diagnostic laparoscopy, abdominal esophagogastrectomy, feeding jejunostomy (J- tube) and On-Q pain pump placement.  SURGEON:  Almond Lint, MD  ASSISTANT:  Gaynelle Adu, MD.  ANESTHESIA:  General and local.  FINDINGS:  Fungating mass at GE junction.  SPECIMEN:  Proximal stomach and posterior gastric nodule to pathology. Frozen section on the proximal stomach showed margins negative.  ESTIMATED BLOOD LOSS:  100 mL.  COMPLICATIONS:  None known.  PROCEDURE:  [name] was identified in the holding area and taken to the operating room where she was placed supine on the operating room table.  General endotracheal anesthesia was induced.  Her arms were tucked and a Foley catheter was placed.  Her abdomen was prepped and draped in sterile fashion.  Time-out was performed according to surgical safety check list.  When all was correct, we continued.  The [facility] trocar was placed in the supraumbilical location after anesthetizing the skin with local anesthetic.  The Kochers were used to elevate the fascia and this was incised in the midline with #11 blade.  A 0 Vicryl pursestring suture was placed around the fascial incision and the Hasson was introduced into the abdomen, pneumoperitoneum was achieved.  There was a very little visualization because of all the adhesions.  A #5 OptiView port was placed in the left subcostal region.  The additional port was placed in the left lateral abdomen and these adhesions were taken down to the umbilical port.  Additional 2 ports  were placed, one in the epigastrium and one in the left abdomen under direct visualization.  The greater curve was taken down with the Harmonic scalpel.  The gastrohepatic ligament was opened up with the Harmonic scalpel.  The fragments of the stomach were taken up all way the GE junction and taken off the crease.  At this point, an EGD was performed by Dr. Andrey Campanile, which will be dictated separately.  It was apparent that from palpation and from the EGD that the mass encompassed too much of the proximal stomach to be able to get gastric wedge around it.  The patient's abdomen was opened and a bougie was placed in the abdomen. Open dissection also confirmed this.  The tissue was taken off the greater curve, taking care to preserve the right gastroepiploic artery. The left gastric was divided down at its origin with the Echelon vascular load.  The right angle was used to take the esophagus off the crura.  Stay sutures were placed on the esophagus.  The gastroepiploic on left was taken down off the gastric curve.  The gold load of the battery-operated Echelon was then used to fire across the stomach creating a gastric tube.  The proximal aspect was divided with a long scissors after pulling invasion  bag.  The margins were clinically negative, that were sent off for frozen.  The distal esophagus was divided.  While the first dissections were sent off, a J-tube was placed in the left upper quadrant around 30 cm distal to the ligament of Treitz.  This was placed into the jejunum with a pursestring suture, which was a small jejunotomy with a Bovie and a pursestring suture was used to secure it.  It was passed further and was pulled into place. This was secured to the abdominal wall in linear fashion with 2-0 silk sutures.  This was tied externally with a 2-0 nylon.  The margins at this point did return back negative and a 25 EEA was used to create a circular anastomosis at the new GE junction.   An anterior gastrotomy was made to pass the stapler through.  This was placed through the posterior wall of the stomach.  #0 Prolene was used to secure the anvil of the stapler in the distal esophagus.  This was coupled and stapled down. The rings were intact.  The defect in the stomach was closed with a TA- 60 and then the stay sutures were placed between the distal esophagus and the stomach with 2-0 silks x4.  A drain was placed posterior and the abdomen was irrigated.  A pyloroplasty was created.  The On-Q pain pump catheters were then placed in the abdominal wall through stab incisions with #11 blade.  A tunneler was used.  These were placed in the preperitoneal location.  The Seprafilm was placed underneath the fascia. The fascia was closed with #1 loop PDS sutures.  The skin was irrigated and then closed with staples.  The wounds were clean, dried, and dressed with dry sterile dressings.  The patient was awaken from anesthesia and taken to PACU in stable condition.     Almond Lint, MD     FB/MEDQ  D:  11/08/2010  T:  11/08/2010  Job:  [id number]  Electronically Signed by Almond Lint MD on 11/12/2010 02:03:37 PM

## 2010-11-13 LAB — BASIC METABOLIC PANEL
BUN: 4 mg/dL — ABNORMAL LOW (ref 6–23)
Chloride: 107 mEq/L (ref 96–112)
Creatinine, Ser: 0.56 mg/dL (ref 0.50–1.10)
GFR calc Af Amer: >60 mL/min (ref >60)
Glucose, Bld: 126 mg/dL — ABNORMAL HIGH (ref 70–99)
Potassium: 3.7 mEq/L (ref 3.5–5.1)

## 2010-11-13 LAB — CBC
HCT: 28.2 % — ABNORMAL LOW (ref 36.0–46.0)
Hemoglobin: 8.6 g/dL — ABNORMAL LOW (ref 12.0–15.0)
MCHC: 30.5 g/dL (ref 30.0–36.0)
MCV: 90.1 fL (ref 78.0–100.0)
RDW: 15.9 % — ABNORMAL HIGH (ref 11.5–15.5)
WBC: 4.8 10*3/uL (ref 4.0–10.5)

## 2010-11-14 ENCOUNTER — Inpatient Hospital Stay (HOSPITAL_COMMUNITY): Payer: Medicare Other

## 2010-11-14 LAB — BASIC METABOLIC PANEL
BUN: 7 mg/dL (ref 6–23)
Calcium: 9 mg/dL (ref 8.4–10.5)
Creatinine, Ser: 0.64 mg/dL (ref 0.50–1.10)
GFR calc Af Amer: >60 mL/min (ref >60)
GFR calc non Af Amer: >60 mL/min (ref >60)
Glucose, Bld: 136 mg/dL — ABNORMAL HIGH (ref 70–99)
Potassium: 3.3 mEq/L — ABNORMAL LOW (ref 3.5–5.1)

## 2010-11-14 LAB — CBC
HCT: 28.1 % — ABNORMAL LOW (ref 36.0–46.0)
Hemoglobin: 8.7 g/dL — ABNORMAL LOW (ref 12.0–15.0)
MCH: 27.7 pg (ref 26.0–34.0)
MCHC: 31 g/dL (ref 30.0–36.0)
MCV: 89.5 fL (ref 78.0–100.0)
RDW: 16 % — ABNORMAL HIGH (ref 11.5–15.5)

## 2010-11-14 NOTE — Op Note (Signed)
  [name], [name]             ACCOUNT NO.:  [id number]  MEDICAL RECORD NO.:  [id number]  LOCATION:  0008                         FACILITY:  [facility]  PHYSICIAN:  Mary Sella. Andrey Campanile, MD     DATE OF BIRTH:  Sep 13, 1930  DATE OF PROCEDURE:  11/08/2010 DATE OF DISCHARGE:                              OPERATIVE REPORT   PREOPERATIVE DIAGNOSIS:  Gastric cardia adenoma.  POSTOPERATIVE DIAGNOSIS:  Gastric cardia adenoma.  PROCEDURE:  Esophagogastroduodenoscopy.  SURGEON:  Mary Sella. Andrey Campanile, MD  DESCRIPTION OF PROCEDURE:  Please see Dr. Berdine Dance separate operative note regarding key details of procedure.  Briefly, this patient is an African-American female who had a GI bleed.  Preoperative endoscopy demonstrated a large tumor in the cardia of her stomach with the biopsy showing adenoma with a focal high-grade dysplasia.  Dr. Donell Beers asked me to assist her.  After we had located the mass in the proximal stomach, I scrubbed out and obtained the Olympus endoscope and gently placed it in the oropharynx and guided it under direct visualization down her esophagus into her gastric cavity.  The Z-line was visualized at about 39 cm from the incisors.  The stomach was insufflated.  The body and distal stomach appeared normal.  I then retroflexed the scope which revealed a large fungating ulcerated mass in the anterior wall of the stomach in the cardia.  It was approximately 1 cm from the GE junction.  It had diffuse oozing.  The rest of the stomach was surveilled.  There were no other gross abnormalities.  The scope was detorsed and the stomach was desufflated.  The Olympus endoscope was made from the gastric cavity and esophagus without any complications.  I then scrubbed back in and Dr. Donell Beers performed the remaining procedure.  Please see her separate operative note.  There were no immediate complications.     Mary Sella. Andrey Campanile, MD     EMW/MEDQ  D:  11/08/2010  T:  11/08/2010  Job:   [id number]  Electronically Signed by Gaynelle Adu M.D. on 11/14/2010 02:32:58 PM

## 2010-11-15 LAB — CBC
HCT: 29.3 % — ABNORMAL LOW (ref 36.0–46.0)
Hemoglobin: 9.1 g/dL — ABNORMAL LOW (ref 12.0–15.0)
MCH: 27.5 pg (ref 26.0–34.0)
MCHC: 31.1 g/dL (ref 30.0–36.0)
MCV: 88.5 fL (ref 78.0–100.0)
Platelets: 414 10*3/uL — ABNORMAL HIGH (ref 150–400)
RBC: 3.31 MIL/uL — ABNORMAL LOW (ref 3.87–5.11)
RDW: 16.1 % — ABNORMAL HIGH (ref 11.5–15.5)
WBC: 6 10*3/uL (ref 4.0–10.5)

## 2010-11-15 LAB — BASIC METABOLIC PANEL
BUN: 12 mg/dL (ref 6–23)
Calcium: 9.4 mg/dL (ref 8.4–10.5)
Creatinine, Ser: 0.58 mg/dL (ref 0.50–1.10)
GFR calc Af Amer: >60 mL/min (ref >60)
GFR calc non Af Amer: >60 mL/min (ref >60)
Glucose, Bld: 143 mg/dL — ABNORMAL HIGH (ref 70–99)

## 2010-11-16 ENCOUNTER — Inpatient Hospital Stay (HOSPITAL_COMMUNITY): Payer: Medicare Other

## 2010-11-16 LAB — CBC
MCH: 26.8 pg (ref 26.0–34.0)
MCHC: 31 g/dL (ref 30.0–36.0)
Platelets: 500 10*3/uL — ABNORMAL HIGH (ref 150–400)
RDW: 16.1 % — ABNORMAL HIGH (ref 11.5–15.5)

## 2010-11-16 LAB — BASIC METABOLIC PANEL
Calcium: 9.8 mg/dL (ref 8.4–10.5)
GFR calc Af Amer: >60 mL/min (ref >60)
GFR calc non Af Amer: >60 mL/min (ref >60)
Glucose, Bld: 139 mg/dL — ABNORMAL HIGH (ref 70–99)
Potassium: 3.2 mEq/L — ABNORMAL LOW (ref 3.5–5.1)
Sodium: 136 mEq/L (ref 135–145)

## 2010-11-18 ENCOUNTER — Inpatient Hospital Stay (HOSPITAL_COMMUNITY): Payer: Medicare Other

## 2010-11-18 ENCOUNTER — Other Ambulatory Visit (INDEPENDENT_AMBULATORY_CARE_PROVIDER_SITE_OTHER): Payer: Self-pay | Admitting: General Surgery

## 2010-11-18 DIAGNOSIS — C169 Malignant neoplasm of stomach, unspecified: Secondary | ICD-10-CM

## 2010-11-18 MED ORDER — IOHEXOL 300 MG/ML  SOLN: 120.0000 mL | Freq: Once | INTRAMUSCULAR | Status: AC | PRN

## 2010-11-19 LAB — CBC
MCH: 27.5 pg (ref 26.0–34.0)
Platelets: 699 10*3/uL — ABNORMAL HIGH (ref 150–400)
RBC: 3.13 MIL/uL — ABNORMAL LOW (ref 3.87–5.11)

## 2010-11-19 LAB — BASIC METABOLIC PANEL
CO2: 32 mEq/L (ref 19–32)
Calcium: 9.3 mg/dL (ref 8.4–10.5)
GFR calc non Af Amer: >60 mL/min (ref >60)
Potassium: 3.7 mEq/L (ref 3.5–5.1)
Sodium: 139 mEq/L (ref 135–145)

## 2010-11-19 LAB — PHOSPHORUS: Phosphorus: 3.8 mg/dL (ref 2.3–4.6)

## 2010-11-19 LAB — MAGNESIUM: Magnesium: 2.4 mg/dL (ref 1.5–2.5)

## 2010-11-20 ENCOUNTER — Inpatient Hospital Stay (HOSPITAL_COMMUNITY): Payer: Medicare Other

## 2010-11-20 DIAGNOSIS — C169 Malignant neoplasm of stomach, unspecified: Secondary | ICD-10-CM

## 2010-11-20 MED ORDER — IOHEXOL 300 MG/ML  SOLN
100.0000 mL | Freq: Once | INTRAMUSCULAR | Status: AC | PRN
  Administered 2010-11-20: 100 mL via INTRAVENOUS

## 2010-11-21 LAB — FERRITIN: Ferritin: 151 ng/mL (ref 10–291)

## 2010-11-21 LAB — RETICULOCYTES
RBC.: 3.01 MIL/uL — ABNORMAL LOW (ref 3.87–5.11)
Retic Ct Pct: 3.9 % — ABNORMAL HIGH (ref 0.4–3.1)

## 2010-11-21 LAB — CBC
Hemoglobin: 8.1 g/dL — ABNORMAL LOW (ref 12.0–15.0)
MCHC: 31 g/dL (ref 30.0–36.0)
RDW: 16.9 % — ABNORMAL HIGH (ref 11.5–15.5)
WBC: 8.3 10*3/uL (ref 4.0–10.5)

## 2010-11-22 LAB — CBC
HCT: 28.3 % — ABNORMAL LOW (ref 36.0–46.0)
Hemoglobin: 8.8 g/dL — ABNORMAL LOW (ref 12.0–15.0)
WBC: 10.2 10*3/uL (ref 4.0–10.5)

## 2010-11-25 ENCOUNTER — Telehealth (INDEPENDENT_AMBULATORY_CARE_PROVIDER_SITE_OTHER): Payer: Self-pay | Admitting: General Surgery

## 2010-11-25 NOTE — Telephone Encounter (Signed)
PT AND NIECE CALLED TO REPORT THAT PT VOMITED 200CC AFTER FEEDING TUBE INFUSION ON Saturday. SHE IS ABLE TO DRINK ENSURE AND LONGEVITY WITHOUT DIFFICULTY. DR. Donell Beers CHANGED TUBE FEEDING ORDER TO READ 60CC/HR OVER 10-12 HRS. ORDER FAXED TO [facility] NUTRITION SERVICES/ PT NOTIFIED. PT WILL CALL IN AM IF VOMITING CONTINUES.

## 2010-11-28 ENCOUNTER — Telehealth (INDEPENDENT_AMBULATORY_CARE_PROVIDER_SITE_OTHER): Payer: Self-pay

## 2010-11-28 NOTE — Telephone Encounter (Signed)
Rec'd a phone call from [facility] on pt [name] requesting a verbal order for a walker for PT.  I ok'd for Dr. Donell Beers.

## 2010-11-29 NOTE — H&P (Signed)
  [name], [name]             ACCOUNT NO.:  [id number]  MEDICAL RECORD NO.:  [id number]           PATIENT TYPE:  E  LOCATION:  [facility]                         FACILITY:  [facility]  PHYSICIAN:  Baltazar Najjar, MD     DATE OF BIRTH:  [birth year]  DATE OF ADMISSION:  09/23/2010 DATE OF DISCHARGE:                             HISTORY & PHYSICAL   ADDENDUM:  The patient will be typed and crossed match and we will transfuse 2 units of packed RBCs today.  Rest of plan of care as dictated previously in H and P.          ______________________________ Baltazar Najjar, MD     SA/MEDQ  D:  09/23/2010  T:  09/23/2010  Job:  [id number]  cc:   [name], MD  Electronically Signed by Hannah Beat MD on 11/29/2010 03:05:09 PM

## 2010-11-29 NOTE — H&P (Signed)
NAMECHRISTANA, [name]             ACCOUNT NO.:  [id number]  MEDICAL RECORD NO.:  [id number]           PATIENT TYPE:  LOCATION:                                 FACILITY:  PHYSICIAN:  Baltazar Najjar, MD     DATE OF BIRTH:  1931-01-29  DATE OF ADMISSION: DATE OF DISCHARGE:                             HISTORY & PHYSICAL   PRIMARY CARE PHYSICIAN:  [name], MD  CODE STATUS:  Full code.  CHIEF COMPLAINT:  Black stool.  HISTORY OF PRESENT ILLNESS:  [name] is a 77 year old African American woman who presented to the ER today with chief complaint of black stools for the last 3-4 days.  She denies any bright red blood per rectum.  Denies any hematemesis.  No abdominal pain or cramping.  She denies any chest pain.  However, she stated that she has shortness of breath with exertion.  She had one episode of dizziness as per her today here in the ER.  However, she denies any previous lightheadedness and dizziness.  The patient was initially seen in [facility]. However, she was transferred to the ER when she was found to have a hemoglobin of 6.8 and guaiac positive stool.  We are asked to see her for admission for further management.  She was hemodynamically stable in the ER when she arrived.  PAST MEDICAL HISTORY: 1. History of anemia. 2. History of colonoscopy and blebectomy back in 2005 and as per her,     she also had lobectomy done about one and a half year ago.  She is     followed by Dr. Laural Benes from [facility]. 3. Hypercholesterolemia.  SOCIAL HISTORY:  She lives alone.  Denies any drinking of alcohol or smoking or illicit drug use.  FAMILY HISTORY:  Denies any significant family history for cancers or GI issues.  ALLERGIES:  No known drug allergies.  HOME MEDICATION: 1. Ventolin inhaler 1 puff q.4 h. p.r.n. 2. Systane over the counter eye lubricant, 1 drop right eye twice     daily. 3. Zegerid over-the-counter 1 tablet p.o. daily as needed for  stomach     acid. 4. ICaps over the counter eye vitamin 1 tablet p.o. daily. 5. Colace 1 capsule p.o. daily as needed.  She takes with iron sulfate     for constipation. 6. Fish oil 1 capsule p.o. daily. 7. Vitamin B12 1 tablet p.o. daily. 8. Calcium 600 mg with vitamin D 1 tablet p.o. daily. 9. Multivitamin 1 tablet p.o. daily. 10.Ferrous sulfate 325 mg 1 tablet p.o. daily. 11.Hydroxyzine 10 mg p.o. daily as needed. 12.Lovastatin 20 mg p.o. daily.  REVIEW OF SYSTEMS:  Chest complaint of shortness of breath with exertion.  No cough.  CARDIOVASCULAR:  Transient dizziness and shortness of breath and exertion as above.  No chest pain.  ABDOMEN:  No abdominal pain.  No nausea.  No vomiting.  No hematemesis.  Significant for black stool as above in HPI.  GU:  Denies any dysuria, any urinary frequency, or hesitancy.  Denies any flank pain or hematuria.  CONSTITUTIONAL:  No fever or chills.  PHYSICAL EXAM:  VITAL SIGNS:  Blood pressure 129/55, pulse rate 79, temperature 98.5, O2 sat 100% on oxygen. GENERAL:  She is alert, oriented x3, not in any distress. NECK:  Supple.  No JVD. LUNGS:  Clear to auscultation bilaterally. CARDIOVASCULAR:  S1 and S2, regular rhythm and rate. ABDOMEN:  Soft, nontender.  Bowel sounds heard normally. EXTREMITIES:  Showed no pedal edema. NEUROLOGY:  She is alert and oriented x3 and no focal neurological deficit appreciated.  LABORATORY DATA:  Stool for occult blood positive.  Hemoglobin 6.8, hematocrit of 20, potassium 3.8.  Sodium 139, BUN 9, creatinine 0.8.  RADIOLOGY:  None.  ASSESSMENT AND PLAN: 1. Melena. 2. History of anemia. 3. Hyperlipidemia.  PLAN: 1. The patient will be admitted to telemetry floor to Dr. [name] service. 2. We will monitor serial H and H q.8 h. for 48 hours. 3. I will keep her on clear liquid diet and start her on IV Protonix. 4. Dr. Ewing Schlein from GI service was consulted by the ER and will see her     in the  morning. 5. Hydrate with IV fluids. 6. Continue outpatient medication for her chronic medical problems. 7. Deep venous thrombosis prophylaxis.  We will use mechanical     sequential compression devices. 8. We will inform Dr. Donette Larry with the patient admission.  CODE STATUS:  The patient is a full code.          ______________________________ Baltazar Najjar, MD     SA/MEDQ  D:  09/23/2010  T:  09/23/2010  Job:  [id number]  cc:   [name], MD  Electronically Signed by Hannah Beat MD on 11/29/2010 03:05:21 PM

## 2010-12-03 ENCOUNTER — Telehealth (INDEPENDENT_AMBULATORY_CARE_PROVIDER_SITE_OTHER): Payer: Self-pay

## 2010-12-03 NOTE — Telephone Encounter (Signed)
[name] call seeking feeding regimen. Last order was changed  because: PT AND NIECE CALLED TO REPORT THAT PT VOMITED 200CC AFTER FEEDING TUBE INFUSION ON Saturday. SHE IS ABLE TO DRINK ENSURE AND LONGEVITY WITHOUT DIFFICULTY. DR. Donell Beers CHANGED TUBE FEEDING ORDER TO READ 60CC/HR OVER 10-12 HRS. ORDER FAXED TO [facility] NUTRITION SERVICES/ PT NOTIFIED. PT WILL CALL IN AM IF VOMITING CONTINUES. Message relayed to [facility] nurse. [name] 12/03/2010

## 2010-12-04 NOTE — Consult Note (Signed)
[name], [name]             ACCOUNT NO.:  [id number]  MEDICAL RECORD NO.:  [id number]  LOCATION:  1535                         FACILITY:  [facility]  PHYSICIAN:  Laurice Record, M.D.DATE OF BIRTH:  1930-05-11  DATE OF CONSULTATION:  11/20/2010 DATE OF DISCHARGE:                                CONSULTATION   REASON FOR CONSULTATION:  GASTRIC CANCER, GIST.  CONSULTING PHYSICIAN:  Laurice Record, M.D.  ROOM NUMBER:  [id number].  REQUESTING PHYSICIAN:  Almond Lint, MD  HISTORY OF PRESENT ILLNESS:  [name] is a pleasant 77 year old African-American female who, about 2 months ago, had presented to the emergency department with a hemoglobin of 6.8 as well as guaiac positive stools.  The patient received 2 units of packed RBCs transfusion and on Sep 24, 2010, Dr. Laural Benes performed a diagnostic EGD, with the finding of a large tumor found at the GE junction, and filling the gastric cardia.  The gastric fundus, body, and antrum as well as pylorus appeared normal.  Biopsies were taken, and pathology was consistent with gastric adenoma, with focal high-grade dysplasia.  She was re-admitted on November 08, 2010 for surgery.  Intraoperative EGD confirmed the mass in the proximal stomach, 1 cm from the EG junction.  The patient then underwent a diagnostic laparoscopy, abdominal esophagogastrectomy with feeding tube jejunostomy on the same day, November 08, 2010.  Surgical pathology, case number [id number] revealed a 6.2, well-differentiated adenocarcinoma, intestinal type.  Surgical margins were negative.  The tumor was seen to invade through the muscularis propria involving the serosa.  There was evidence of lymphovascular invasion, 4 of 5 nodules were positive for malignancy.  Pathology stage was T4a N2.  In addition, biopsy of a posterior gastric mass revealed a 1.2-cm GIST.  CT of the abdomen and pelvis on Sep 25, 2010 revealed a 5.4- x 3.0-cm mass.  The gastrohepatic ligament nodes  were seen.  No retroperitoneal, or retropleural adenopathy.  Old granulomatous disease was seen in the liver.  There was descending diverticulosis.  Based on these findings, we were asked to see her in consultation, with recommendations.  The patient is recovering for surgery.  She notes shortness of breath on ambulation as well as surgical pain, but no new areas of pain appeared. She denies any fever or chills.  Previously, she was in good health. She denies any significant amount of weight loss or anorexia.  PAST MEDICAL HISTORY: 1. Dyslipidemia. 2. Iron-deficiency anemia, on iron supplement.  The patient received a     total of 4 units of blood during this hospitalization. 3. Osteoarthritis. 4. COPD. 5. Prior history of colon polyps, benign per endoscopy in the year     2005.  PAST SURGICAL HISTORY: 1. Status post diagnostic laparoscopy, abdominal esophagogastrectomy,     J-tube, and On-Q pump placement, Dr. Donell Beers, November 08, 2010. 2. Status post open cholecystectomy. 3. Status post total abdominal hysterectomy. 4. Status post polypectomy in 2005, with negative findings, Dr.     Laural Benes.  ALLERGIES:  NKDA.  MEDICATIONS: 1. Dulcolax. 2. Peridex. 3. Lovenox. 4. Hydrochlorothiazide. 5. Jevity. 6. Ventolin. 7. Benadryl. 8. Apresoline. 9. Morphine sulfate. 10.Zofran. 11.Phenergan.  REVIEW OF SYSTEMS:  See HPI for  significant positives.  Rest of the review of systems was negative.  FAMILY HISTORY:  Mother died with a stroke.  Father died with pneumonia. She had a total of 2 sisters and 4 brothers, all deceased.  One died with MI, one brother died with COPD, two brothers died with alcohol complications, and one sister died with cancer of unknown type.  SOCIAL HISTORY:  The patient is widow.  No children.  Full code. Retired from [employer].  The patient was exposed to several chemicals per her report.  No tobacco, alcohol, or recreational drug use.  PHYSICAL  EXAMINATION:  GENERAL:  This is a thin 77 year old African- American female, in no acute distress.  Alert and oriented x3. VITAL SIGNS:  Blood pressure 111/72, pulse 110, respirations 19, temperature 97.8, O2 sats 92% in 0.5 L. HEENT:  Normocephalic, atraumatic.  Sclerae anicteric.  Oral cavity without thrush or lesions. NECK:  Supple.  No cervical or supraclavicular masses.  No axillary masses LUNGS:  Clear to auscultation bilaterally. CARDIOVASCULAR:  Regular rate and rhythm.  No murmurs, rubs, or gallops. ABDOMEN:  Soft, nontender.  Bowel sounds x4.  No hepatosplenomegaly. The patient has only incisional tenderness.  The Steri-Strips present and a tube is in place, with no areas of drainage. GU and RECTAL:  Deferred. EXTREMITIES:  No clubbing or cyanosis.  No edema.  No inguinal masses. SKIN:  Without other areas of bruising or petechial rash. NEURO:  Nonfocal.  LABS:  Hemoglobin 8.6, hematocrit 27.2, white count 9.1, platelets 699,000, MCV 86.9, iron 326, B12 953.  Sodium 139, potassium 3.7, chloride 99, CO2 32, BUN 21, creatinine 0.6, glucose 122, calcium 9.3, total bilirubin 0.3, alkaline phosphatase 77, AST 25, ALT 58, total protein 5.0, albumin 2.0, calcium 9.3.  IMPRESSION AND RECOMMENDATIONS: 1. 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  Dr. Dalene Carrow discussed the need for adjuvant     chemotherapy with epirubicin, oxaliplatin, and Xeloda versus taxol,      carboplatin with radiation.  These will be administered as an outpatient.       In the interim, the patient continues to recover from surgery. We will      schedule a followup visit upon discharge. 2. Anemia, obtain iron studies.  May require supplementation. 3. Monitor B12 levels perodically as patient is status post gastrectomy.  Thank  you.      [name], PA-C   ______________________________ Laurice Record, M.D.    SW/MEDQ  D:  11/20/2010  T:  11/20/2010  Job:  [id number]  cc:   Almond Lint, MD [address] [place] [address]  Electronically Signed by [name] P.A. on 11/21/2010 02:53:56 PM Electronically Signed by Arlan Organ M.D. on 12/04/2010 01:59:31 PM

## 2010-12-05 ENCOUNTER — Encounter (HOSPITAL_BASED_OUTPATIENT_CLINIC_OR_DEPARTMENT_OTHER): Payer: Medicare Other | Admitting: Hematology and Oncology

## 2010-12-05 ENCOUNTER — Other Ambulatory Visit: Payer: Self-pay | Admitting: Hematology and Oncology

## 2010-12-05 DIAGNOSIS — C169 Malignant neoplasm of stomach, unspecified: Secondary | ICD-10-CM

## 2010-12-05 DIAGNOSIS — C160 Malignant neoplasm of cardia: Secondary | ICD-10-CM

## 2010-12-09 ENCOUNTER — Ambulatory Visit (INDEPENDENT_AMBULATORY_CARE_PROVIDER_SITE_OTHER): Payer: Medicare Other | Admitting: General Surgery

## 2010-12-09 ENCOUNTER — Encounter (INDEPENDENT_AMBULATORY_CARE_PROVIDER_SITE_OTHER): Payer: Self-pay | Admitting: General Surgery

## 2010-12-09 VITALS — BP 124/82 | HR 60 | Temp 96.4°F

## 2010-12-09 DIAGNOSIS — C160 Malignant neoplasm of cardia: Secondary | ICD-10-CM | POA: Insufficient documentation

## 2010-12-09 MED ORDER — OXYCODONE HCL 5 MG/5ML PO SOLN: 5.0000 mg | ORAL | Status: DC | PRN

## 2010-12-09 NOTE — Progress Notes (Signed)
[name] is a 77 y.o. female.    Chief Complaint  Patient presents with  . Other    post op mass    HPI HPI Patient is doing well.  She is still coughing up phlegm sometimes when she eats.  She is not having nausea or vomiting.  She denies fever/chills.  She is having bowel movements.   She is taking oxycodone every few days or so.    Past Medical History  Diagnosis Date  . Anemia     Past Surgical History  Procedure Date  . Esophagogastrectomy, j tube 11/08/2010   . Cholecystectomy   . Abdominal hysterectomy     No family history on file.  Social History History  Substance Use Topics  . Smoking status: Never Smoker   . Smokeless tobacco: Not on file  . Alcohol Use: No    No Known Allergies  Current Outpatient Prescriptions  Medication Sig Dispense Refill  . calcium-vitamin D (OSCAL WITH D) 500-200 MG-UNIT per tablet Take 1 tablet by mouth as needed.        . Cyanocobalamin (B-12) 100 MCG TABS Take by mouth as needed.        Tery Sanfilippo Calcium (STOOL SOFTENER PO) Take by mouth as needed. Taking Equate otc.       . Ferrous Sulfate (IRON) 325 (65 FE) MG TABS Take by mouth as needed.        . fish oil-omega-3 fatty acids 1000 MG capsule Take 2 g by mouth as needed.        . hydrOXYzine (ATARAX) 10 MG tablet Take 10 mg by mouth 2 (two) times daily as needed.        . lovastatin (MEVACOR) 20 MG tablet Take 20 mg by mouth daily.        . metoCLOPramide (REGLAN) 10 MG tablet Take 10 mg by mouth daily.        . Multiple Vitamins-Minerals (CENTRUM SILVER PO) Take by mouth as needed.        . Multiple Vitamins-Minerals (ICAPS PO) Take by mouth as needed.        Marland Kitchen oxyCODONE (ROXICODONE) 5 MG/5ML solution Take 5 mg by mouth as needed.        . pantoprazole (PROTONIX) 40 MG tablet Take 40 mg by mouth daily.          Review of Systems Review of Systems  All other systems reviewed and are negative.    Physical Exam Physical Exam  Constitutional: She is oriented to  person, place, and time. She appears well-developed and well-nourished. No distress.  HENT:  Head: Normocephalic and atraumatic.  Eyes: Conjunctivae are normal. Pupils are equal, round, and reactive to light. No scleral icterus.  Neck: Normal range of motion.  Cardiovascular: Normal rate.   Respiratory: Effort normal and breath sounds normal. No respiratory distress. She exhibits no tenderness.  GI: Soft. Bowel sounds are normal. She exhibits no distension. There is no tenderness. There is no rebound.  Musculoskeletal: Normal range of motion. She exhibits no edema.  Neurological: She is alert and oriented to person, place, and time.  Skin: She is not diaphoretic.  Psychiatric: She has a normal mood and affect. Her behavior is normal. Judgment and thought content normal.     Blood pressure 124/82, pulse 60, temperature 96.4 F (35.8 C), temperature source Temporal.  Assessment/Plan Cancer of the gastroesophageal junction, pT4aN2, s/p esophagogastrectomy, J tube 11/08/2010 Continue tube feeds. Doing well. Follow up in 2-3 months  See Dr. Dalene Carrow about chemo as scheduled.        Jimma Ortman 12/09/2010, 10:17 AM

## 2010-12-09 NOTE — Progress Notes (Signed)
Addended by: Almond Lint on: 12/09/2010 10:41 AM   Modules accepted: Orders

## 2010-12-09 NOTE — Assessment & Plan Note (Addendum)
Continue tube feeds. Doing well. Follow up in 2-3 months  See Dr. Dalene Carrow about chemo as scheduled.

## 2010-12-11 ENCOUNTER — Telehealth (INDEPENDENT_AMBULATORY_CARE_PROVIDER_SITE_OTHER): Payer: Self-pay

## 2010-12-11 NOTE — Telephone Encounter (Signed)
Pt is ready for PAC.  I let Debbie in scheduling know.

## 2010-12-13 ENCOUNTER — Encounter (HOSPITAL_COMMUNITY)
Admission: RE | Admit: 2010-12-13 | Discharge: 2010-12-13 | Disposition: A | Discharge status: Home / Self Care | Payer: Medicare Other | Source: Ambulatory Visit | Attending: Hematology and Oncology | Admitting: Hematology and Oncology

## 2010-12-13 DIAGNOSIS — C169 Malignant neoplasm of stomach, unspecified: Secondary | ICD-10-CM

## 2010-12-17 NOTE — Discharge Summary (Signed)
  NAMECHONDRA, [name]             ACCOUNT NO.:  [id number]  MEDICAL RECORD NO.:  [id number]  LOCATION:  1535                         FACILITY:  [facility]  PHYSICIAN:  Almond Lint, MD       DATE OF BIRTH:  07/17/1930  DATE OF ADMISSION:  11/08/2010 DATE OF DISCHARGE:  11/23/2010                              DISCHARGE SUMMARY   PRINCIPAL DIAGNOSIS:  Adenocarcinoma of the gastroesophageal junction, pT4aN2M0.  PRINCIPAL PROCEDURES:  Abdominal esophagogastrectomy with feeding J-tube on November 08, 2010.  SECONDARY DIAGNOSES:  Include anemia, hypercholesterolemia, and gastroesophageal reflux disease.  DISCHARGE MEDICATIONS: 1. Zegerid 1 tablet daily as needed. 2. Vitamin B12 once a day. 3. Albuterol every 4 hours as needed. 4. ICaps eye vitamin p.o. once a day. 5. Fish oil 100 mg once a day. 6. Docusate as needed. 7. Calcium 600 mg once a day. 8. Multivitamin once a day. 9. Lovastatin 20 mg once a day. 10.Hydroxyzine 10 mg as needed. 11.Jevity 80 mg an hour x 12 hours a day. 12.Protonix 40 mg once a day. 13.Reglan 10 mg 3 times a day.  HOSPITAL COURSE:  The patient was admitted for her surgery with anemia with a hemoglobin of 9.8.  This dropped to around 8.1, was back up to 8.8 prior to discharge.  She required some transfusion in the operating room.  Her Foley was discontinued on postop day #2 and tube feeds were started at terrific levels.  She had protein-calorie malnutrition and tube feeds were started.  She had some hemoptysis and some hypoxia.  She did have a small pleural effusion that did not require any treatment.  Dr. Dalene Carrow saw her while she was in the hospital.  She did require some antihypertensive medications while in the hospital.  Once she was diuresed, this improved.   She continued to have oxygen requirement and continued to have low-grade fevers.  On getting a CT scan to evaluate this, this did not show any evidence of leak or abscess.  She had a PE ruled out  due to hypoxia as well.  She was able to tolerate tube feeds and diet and was able to be discharged to home in improved condition.  She is to go home on home health nursing and physical therapy.  Again, she is discharged to home in improved condition.  She is not doing heavy lifting or strenuous activity or guarding.  She may shower at home.  I will see her back in around 2-3 weeks.     Almond Lint, MD     FB/MEDQ  D:  12/09/2010  T:  12/10/2010  Job:  [id number]  Electronically Signed by Almond Lint MD on 12/17/2010 12:12:00 PM

## 2010-12-20 ENCOUNTER — Encounter (HOSPITAL_COMMUNITY)
Admission: RE | Admit: 2010-12-20 | Discharge: 2010-12-20 | Disposition: A | Discharge status: Home / Self Care | Payer: Medicare Other | Source: Ambulatory Visit | Attending: Hematology and Oncology | Admitting: Hematology and Oncology

## 2010-12-20 ENCOUNTER — Encounter (HOSPITAL_COMMUNITY): Payer: Self-pay

## 2010-12-20 DIAGNOSIS — C169 Malignant neoplasm of stomach, unspecified: Secondary | ICD-10-CM | POA: Insufficient documentation

## 2010-12-20 DIAGNOSIS — Z934 Other artificial openings of gastrointestinal tract status: Secondary | ICD-10-CM | POA: Insufficient documentation

## 2010-12-20 DIAGNOSIS — Z9089 Acquired absence of other organs: Secondary | ICD-10-CM | POA: Insufficient documentation

## 2010-12-20 DIAGNOSIS — Z90710 Acquired absence of both cervix and uterus: Secondary | ICD-10-CM | POA: Insufficient documentation

## 2010-12-20 LAB — GLUCOSE, CAPILLARY: Glucose-Capillary: 112 mg/dL — ABNORMAL HIGH (ref 70–99)

## 2010-12-20 MED ORDER — FLUDEOXYGLUCOSE F - 18 (FDG) INJECTION
17.0000 | Freq: Once | INTRAVENOUS | Status: AC | PRN
  Administered 2010-12-20: 17 via INTRAVENOUS

## 2010-12-23 ENCOUNTER — Other Ambulatory Visit (INDEPENDENT_AMBULATORY_CARE_PROVIDER_SITE_OTHER): Payer: Self-pay | Admitting: General Surgery

## 2010-12-23 ENCOUNTER — Encounter (HOSPITAL_BASED_OUTPATIENT_CLINIC_OR_DEPARTMENT_OTHER)
Admission: RE | Admit: 2010-12-23 | Discharge: 2010-12-23 | Disposition: A | Discharge status: Home / Self Care | Payer: Medicare Other | Source: Ambulatory Visit | Attending: General Surgery | Admitting: General Surgery

## 2010-12-23 ENCOUNTER — Encounter (INDEPENDENT_AMBULATORY_CARE_PROVIDER_SITE_OTHER): Payer: Self-pay | Admitting: General Surgery

## 2010-12-23 ENCOUNTER — Other Ambulatory Visit (INDEPENDENT_AMBULATORY_CARE_PROVIDER_SITE_OTHER): Payer: Self-pay

## 2010-12-23 DIAGNOSIS — C160 Malignant neoplasm of cardia: Secondary | ICD-10-CM

## 2010-12-23 LAB — BASIC METABOLIC PANEL
BUN: 9 mg/dL (ref 6–23)
Chloride: 104 mEq/L (ref 96–112)
GFR calc Af Amer: >60 mL/min (ref >60)
Potassium: 4 mEq/L (ref 3.5–5.1)

## 2010-12-23 LAB — CBC
HCT: 32.3 % — ABNORMAL LOW (ref 36.0–46.0)
Hemoglobin: 10 g/dL — ABNORMAL LOW (ref 12.0–15.0)
RBC: 4.03 MIL/uL (ref 3.87–5.11)
RDW: 17.7 % — ABNORMAL HIGH (ref 11.5–15.5)
WBC: 9.1 10*3/uL (ref 4.0–10.5)

## 2010-12-23 LAB — DIFFERENTIAL
Basophils Absolute: 0.1 10*3/uL (ref 0.0–0.1)
Eosinophils Relative: 4 % (ref 0–5)
Lymphocytes Relative: 27 % (ref 12–46)
Neutro Abs: 4.9 10*3/uL (ref 1.7–7.7)
Neutrophils Relative %: 54 % (ref 43–77)

## 2010-12-23 LAB — URINALYSIS, ROUTINE W REFLEX MICROSCOPIC
Bilirubin Urine: NEGATIVE
Nitrite: NEGATIVE
Specific Gravity, Urine: 1.024 (ref 1.005–1.030)
pH: 5.5 (ref 5.0–8.0)

## 2010-12-23 LAB — URINE MICROSCOPIC-ADD ON

## 2010-12-23 MED ORDER — METOCLOPRAMIDE HCL 10 MG PO TABS: 10.0000 mg | ORAL_TABLET | Freq: Every day | ORAL | Status: DC

## 2010-12-23 MED ORDER — PANTOPRAZOLE SODIUM 40 MG PO TBEC: 40.0000 mg | DELAYED_RELEASE_TABLET | Freq: Every day | ORAL | Status: DC

## 2010-12-23 NOTE — Progress Notes (Signed)
Quick Note:  Ms. Windsor needs cipro for UTI. 500 mg po BID times 7 days. Repeat UA day of surgery. ______

## 2010-12-24 ENCOUNTER — Telehealth (INDEPENDENT_AMBULATORY_CARE_PROVIDER_SITE_OTHER): Payer: Self-pay

## 2010-12-24 ENCOUNTER — Other Ambulatory Visit (INDEPENDENT_AMBULATORY_CARE_PROVIDER_SITE_OTHER): Payer: Self-pay | Admitting: General Surgery

## 2010-12-24 DIAGNOSIS — N390 Urinary tract infection, site not specified: Secondary | ICD-10-CM

## 2010-12-24 MED ORDER — CIPROFLOXACIN HCL 500 MG PO TABS: 500.0000 mg | ORAL_TABLET | Freq: Two times a day (BID) | ORAL | Status: AC

## 2010-12-24 NOTE — Telephone Encounter (Signed)
Pt had abd labs which showed a UTI.  Dr. Donell Beers ordered Cipro 500mg  bid x 7 days.  Pt is aware and a message was left with her niece, Annabelle Harman, to return call.  Cipro faxed to pharmacy, and surgery postponed until 8/30 at 7:30 a.m.

## 2010-12-30 ENCOUNTER — Encounter (HOSPITAL_BASED_OUTPATIENT_CLINIC_OR_DEPARTMENT_OTHER)
Admission: RE | Admit: 2010-12-30 | Discharge: 2010-12-30 | Disposition: A | Discharge status: Home / Self Care | Payer: Medicare Other | Source: Ambulatory Visit | Attending: General Surgery | Admitting: General Surgery

## 2010-12-30 LAB — URINE MICROSCOPIC-ADD ON

## 2010-12-30 LAB — DIFFERENTIAL
Basophils Absolute: 0 10*3/uL (ref 0.0–0.1)
Basophils Relative: 0 % (ref 0–1)
Eosinophils Absolute: 0.4 10*3/uL (ref 0.0–0.7)
Eosinophils Relative: 6 % — ABNORMAL HIGH (ref 0–5)
Monocytes Absolute: 1 10*3/uL (ref 0.1–1.0)

## 2010-12-30 LAB — BASIC METABOLIC PANEL
GFR calc Af Amer: >60 mL/min (ref >60)
GFR calc non Af Amer: >60 mL/min (ref >60)
Potassium: 4.3 mEq/L (ref 3.5–5.1)
Sodium: 137 mEq/L (ref 135–145)

## 2010-12-30 LAB — URINALYSIS, ROUTINE W REFLEX MICROSCOPIC
Bilirubin Urine: NEGATIVE
Nitrite: NEGATIVE
Protein, ur: NEGATIVE mg/dL
Specific Gravity, Urine: 1.007 (ref 1.005–1.030)
Urobilinogen, UA: 0.2 mg/dL (ref 0.0–1.0)

## 2010-12-30 LAB — CBC
MCHC: 30.1 g/dL (ref 30.0–36.0)
RDW: 18.1 % — ABNORMAL HIGH (ref 11.5–15.5)

## 2011-01-01 ENCOUNTER — Ambulatory Visit
Admission: RE | Admit: 2011-01-01 | Discharge: 2011-01-01 | Disposition: A | Discharge status: Home / Self Care | Payer: Medicare Other | Source: Ambulatory Visit | Attending: Radiation Oncology | Admitting: Radiation Oncology

## 2011-01-01 DIAGNOSIS — R112 Nausea with vomiting, unspecified: Secondary | ICD-10-CM | POA: Insufficient documentation

## 2011-01-01 DIAGNOSIS — Z90710 Acquired absence of both cervix and uterus: Secondary | ICD-10-CM | POA: Insufficient documentation

## 2011-01-01 DIAGNOSIS — E785 Hyperlipidemia, unspecified: Secondary | ICD-10-CM | POA: Insufficient documentation

## 2011-01-01 DIAGNOSIS — R109 Unspecified abdominal pain: Secondary | ICD-10-CM | POA: Insufficient documentation

## 2011-01-01 DIAGNOSIS — J449 Chronic obstructive pulmonary disease, unspecified: Secondary | ICD-10-CM | POA: Insufficient documentation

## 2011-01-01 DIAGNOSIS — R197 Diarrhea, unspecified: Secondary | ICD-10-CM | POA: Insufficient documentation

## 2011-01-01 DIAGNOSIS — Z510 Encounter for antineoplastic radiation therapy: Secondary | ICD-10-CM | POA: Insufficient documentation

## 2011-01-01 DIAGNOSIS — Z79899 Other long term (current) drug therapy: Secondary | ICD-10-CM | POA: Insufficient documentation

## 2011-01-01 DIAGNOSIS — J4489 Other specified chronic obstructive pulmonary disease: Secondary | ICD-10-CM | POA: Insufficient documentation

## 2011-01-01 DIAGNOSIS — C169 Malignant neoplasm of stomach, unspecified: Secondary | ICD-10-CM | POA: Insufficient documentation

## 2011-01-02 ENCOUNTER — Ambulatory Visit (HOSPITAL_BASED_OUTPATIENT_CLINIC_OR_DEPARTMENT_OTHER)
Admission: RE | Admit: 2011-01-02 | Discharge: 2011-01-02 | Disposition: A | Discharge status: Home / Self Care | Payer: Medicare Other | Source: Ambulatory Visit | Attending: General Surgery | Admitting: General Surgery

## 2011-01-02 ENCOUNTER — Ambulatory Visit (HOSPITAL_COMMUNITY): Payer: Medicare Other | Attending: General Surgery

## 2011-01-02 ENCOUNTER — Ambulatory Visit (HOSPITAL_COMMUNITY): Payer: Medicare Other

## 2011-01-02 DIAGNOSIS — C160 Malignant neoplasm of cardia: Secondary | ICD-10-CM | POA: Insufficient documentation

## 2011-01-02 DIAGNOSIS — Z1231 Encounter for screening mammogram for malignant neoplasm of breast: Secondary | ICD-10-CM

## 2011-01-02 DIAGNOSIS — Z01812 Encounter for preprocedural laboratory examination: Secondary | ICD-10-CM | POA: Insufficient documentation

## 2011-01-02 NOTE — Op Note (Signed)
NAMEKLEIGH, [name]             ACCOUNT NO.:  [id number]  MEDICAL RECORD NO.:  [id number]  LOCATION:                                 FACILITY:  PHYSICIAN:  Almond Lint, MD       DATE OF BIRTH:  02/16/1931  DATE OF PROCEDURE:  01/02/2011 DATE OF DISCHARGE:                              OPERATIVE REPORT   PREOPERATIVE DIAGNOSIS:  T4a N2 gastroesophageal junction carcinoma.  POSTOPERATIVE DIAGNOSIS:  T4a N2 gastroesophageal junction carcinoma.  PROCEDURE:  Port-A-Cath placement in left subclavian vein.  SURGEON:  Almond Lint, MD  ANESTHESIA:  General and local.  FINDINGS:  Easy aspiration and flush.  Catheter cut at 23 cm.  Placed after the procedure 8-French Bard MRI safe PowerPort, left subclavian.  SPECIMEN:  None.  ESTIMATED BLOOD LOSS:  Minimal.  COMPLICATIONS:  None known.  PROCEDURE:  [name] was identified in the holding area and taken to the operating room where she was placed supine on the operating room table.  General anesthesia was induced.  A shoulder roll was placed and her arms were tucked.  Her upper chest and neck were prepped and draped sterilely bilaterally.  The patient was placed into Trendelenburg position.  Time-out was performed according to surgical safety check list.  When all was correct, we continued.  The infraclavicular skin and soft tissue were anesthetized with 0.25% Marcaine with epinephrine.  A large-bore needle was used to access the vein in 2 sticks.  The wire passed easily without ectopy.  Fluoroscopy was used to confirm that the wire was in the venous system.  An appropriate site for a pocket was identified and local anesthetic was administered in this location.  A 3- cm incision was made with #15 blade.  The subcutaneous tissues were divided with the cautery.  Weitlaner retractor was used to assist with visualization.  Once the pectoralis fascia was exposed, an [employer] was used to elevate the skin and soft tissue, while a  pocket was created on the surface of the pectoralis major.  The port was placed into the pocket and this had been pre-attached to the catheter.  The port and the catheter had been flushed.  Four 2-0 Prolene sutures were used to tack the port to the pectoralis fascia.  These were not tied down yet.  The port was placed in the pocket.  The port was then tunneled with the tunneler in the kit up to the wire exit site.  This was laid along the wire under fluoroscopy and an appropriate length catheter was identified.  The catheter was cut at 23 cm.  The dilator and tunneler sheaths were placed over the wire under fluoro and the dilator and wire were removed.  The catheter was advanced through the tunneler sheath and the tunneler sheath was pulled away.  The catheter was in good position on fluoroscopy.  The port was accessed to the skin and aspirated blood and flushed easily.  The Prolene sutures were then tied down.  The 3-0 Vicryl were then used to close the skin in deep dermal interrupted fashion.  The fluoroscopy was then used to reconfirm good position and the port was re-aspirated and flushed with  dilute heparinized saline. This was in the SVC.  The port was then flushed with 4 mL of concentrated heparin flush.  The skin of the wire exit site and the incision were then closed with 4-0 Monocryl in a running subcuticular fashion.  The wounds were then cleaned, dried, and dressed with Dermabond.  The patient was awakened from anesthesia and taken to PACU in stable condition.  Needle, sponge, and instrument counts were correct.     Almond Lint, MD     FB/MEDQ  D:  01/02/2011  T:  01/02/2011  Job:  [id number]  Electronically Signed by Almond Lint MD on 01/02/2011 02:18:20 PM

## 2011-01-10 ENCOUNTER — Other Ambulatory Visit: Payer: Self-pay | Admitting: Hematology and Oncology

## 2011-01-10 ENCOUNTER — Encounter (HOSPITAL_BASED_OUTPATIENT_CLINIC_OR_DEPARTMENT_OTHER): Payer: Medicare Other | Admitting: Hematology and Oncology

## 2011-01-10 DIAGNOSIS — K759 Inflammatory liver disease, unspecified: Secondary | ICD-10-CM

## 2011-01-10 DIAGNOSIS — C160 Malignant neoplasm of cardia: Secondary | ICD-10-CM

## 2011-01-10 DIAGNOSIS — D539 Nutritional anemia, unspecified: Secondary | ICD-10-CM

## 2011-01-10 LAB — CBC & DIFF AND RETIC
BASO%: 0.9 % (ref 0.0–2.0)
Basophils Absolute: 0.1 10*3/uL (ref 0.0–0.1)
EOS%: 6.3 % (ref 0.0–7.0)
HGB: 10.1 g/dL — ABNORMAL LOW (ref 11.6–15.9)
MCH: 24.4 pg — ABNORMAL LOW (ref 25.1–34.0)
MCHC: 30.6 g/dL — ABNORMAL LOW (ref 31.5–36.0)
MCV: 79.7 fL (ref 79.5–101.0)
MONO%: 13.2 % (ref 0.0–14.0)
RBC: 4.14 10*6/uL (ref 3.70–5.45)
RDW: 18.1 % — ABNORMAL HIGH (ref 11.2–14.5)
Retic Ct Abs: 88.6 10*3/uL (ref 33.70–90.70)

## 2011-01-10 LAB — IRON AND TIBC
Iron: 93 ug/dL (ref 42–145)
UIBC: 281 ug/dL (ref 125–400)

## 2011-01-10 LAB — COMPREHENSIVE METABOLIC PANEL
AST: 20 U/L (ref 0–37)
BUN: 11 mg/dL (ref 6–23)
Calcium: 9.9 mg/dL (ref 8.4–10.5)
Chloride: 101 mEq/L (ref 96–112)
Creatinine, Ser: 0.76 mg/dL (ref 0.50–1.10)
Glucose, Bld: 90 mg/dL (ref 70–99)

## 2011-01-10 LAB — FERRITIN: Ferritin: 31 ng/mL (ref 10–291)

## 2011-01-14 ENCOUNTER — Other Ambulatory Visit: Payer: Self-pay | Admitting: Hematology and Oncology

## 2011-01-14 ENCOUNTER — Encounter (HOSPITAL_BASED_OUTPATIENT_CLINIC_OR_DEPARTMENT_OTHER): Payer: Medicare Other | Admitting: Hematology and Oncology

## 2011-01-14 DIAGNOSIS — Z5111 Encounter for antineoplastic chemotherapy: Secondary | ICD-10-CM

## 2011-01-14 DIAGNOSIS — D539 Nutritional anemia, unspecified: Secondary | ICD-10-CM

## 2011-01-14 DIAGNOSIS — C160 Malignant neoplasm of cardia: Secondary | ICD-10-CM

## 2011-01-14 LAB — CBC WITH DIFFERENTIAL/PLATELET
Eosinophils Absolute: 0.4 10*3/uL (ref 0.0–0.5)
HCT: 34.6 % — ABNORMAL LOW (ref 34.8–46.6)
HGB: 10.5 g/dL — ABNORMAL LOW (ref 11.6–15.9)
LYMPH%: 30.2 % (ref 14.0–49.7)
MONO#: 0.7 10*3/uL (ref 0.1–0.9)
NEUT#: 2.7 10*3/uL (ref 1.5–6.5)
NEUT%: 49.3 % (ref 38.4–76.8)
Platelets: 283 10*3/uL (ref 145–400)
WBC: 5.4 10*3/uL (ref 3.9–10.3)
nRBC: 0 % (ref 0–0)

## 2011-01-14 LAB — IRON AND TIBC
%SAT: 30 % (ref 20–55)
TIBC: 369 ug/dL (ref 250–470)
UIBC: 257 ug/dL (ref 125–400)

## 2011-01-17 ENCOUNTER — Other Ambulatory Visit (INDEPENDENT_AMBULATORY_CARE_PROVIDER_SITE_OTHER): Payer: Self-pay | Admitting: General Surgery

## 2011-01-22 ENCOUNTER — Ambulatory Visit: Payer: Medicare Other

## 2011-01-22 ENCOUNTER — Encounter (HOSPITAL_BASED_OUTPATIENT_CLINIC_OR_DEPARTMENT_OTHER): Payer: Medicare Other | Admitting: Hematology and Oncology

## 2011-01-22 ENCOUNTER — Other Ambulatory Visit: Payer: Self-pay | Admitting: Hematology and Oncology

## 2011-01-22 DIAGNOSIS — D539 Nutritional anemia, unspecified: Secondary | ICD-10-CM

## 2011-01-22 DIAGNOSIS — Z5111 Encounter for antineoplastic chemotherapy: Secondary | ICD-10-CM

## 2011-01-22 DIAGNOSIS — C160 Malignant neoplasm of cardia: Secondary | ICD-10-CM

## 2011-01-22 LAB — COMPREHENSIVE METABOLIC PANEL
ALT: 15 U/L (ref 0–35)
AST: 13 U/L (ref 0–37)
Alkaline Phosphatase: 61 U/L (ref 39–117)
CO2: 26 mEq/L (ref 19–32)
Sodium: 139 mEq/L (ref 135–145)
Total Bilirubin: 0.2 mg/dL — ABNORMAL LOW (ref 0.3–1.2)
Total Protein: 6.3 g/dL (ref 6.0–8.3)

## 2011-01-22 LAB — CBC WITH DIFFERENTIAL/PLATELET
BASO%: 0 % (ref 0.0–2.0)
EOS%: 2.4 % (ref 0.0–7.0)
LYMPH%: 5.6 % — ABNORMAL LOW (ref 14.0–49.7)
MCHC: 32.4 g/dL (ref 31.5–36.0)
MONO#: 0.2 10*3/uL (ref 0.1–0.9)
Platelets: ADEQUATE 10*3/uL (ref 145–400)
RBC: 4.1 10*6/uL (ref 3.70–5.45)
WBC: 6.6 10*3/uL (ref 3.9–10.3)

## 2011-01-24 ENCOUNTER — Ambulatory Visit: Payer: Medicare Other

## 2011-01-24 ENCOUNTER — Ambulatory Visit
Admission: RE | Admit: 2011-01-24 | Discharge: 2011-01-24 | Disposition: A | Discharge status: Home / Self Care | Payer: Medicare Other | Source: Ambulatory Visit | Attending: Hematology and Oncology | Admitting: Hematology and Oncology

## 2011-01-25 ENCOUNTER — Emergency Department (HOSPITAL_COMMUNITY)
Admission: EM | Admit: 2011-01-25 | Discharge: 2011-01-25 | Disposition: A | Discharge status: Home / Self Care | Payer: Medicare Other | Attending: Emergency Medicine | Admitting: Emergency Medicine

## 2011-01-25 DIAGNOSIS — IMO0002 Reserved for concepts with insufficient information to code with codable children: Secondary | ICD-10-CM | POA: Insufficient documentation

## 2011-01-25 DIAGNOSIS — E7800 Pure hypercholesterolemia, unspecified: Secondary | ICD-10-CM | POA: Insufficient documentation

## 2011-01-29 ENCOUNTER — Other Ambulatory Visit: Payer: Self-pay | Admitting: Hematology and Oncology

## 2011-01-29 ENCOUNTER — Encounter (HOSPITAL_BASED_OUTPATIENT_CLINIC_OR_DEPARTMENT_OTHER): Payer: Medicare Other | Admitting: Hematology and Oncology

## 2011-01-29 DIAGNOSIS — C160 Malignant neoplasm of cardia: Secondary | ICD-10-CM

## 2011-01-29 DIAGNOSIS — R110 Nausea: Secondary | ICD-10-CM

## 2011-01-29 DIAGNOSIS — Z5111 Encounter for antineoplastic chemotherapy: Secondary | ICD-10-CM

## 2011-01-29 DIAGNOSIS — D539 Nutritional anemia, unspecified: Secondary | ICD-10-CM

## 2011-01-29 DIAGNOSIS — C779 Secondary and unspecified malignant neoplasm of lymph node, unspecified: Secondary | ICD-10-CM

## 2011-01-29 LAB — CBC WITH DIFFERENTIAL/PLATELET
Basophils Absolute: 0 10*3/uL (ref 0.0–0.1)
EOS%: 2.6 % (ref 0.0–7.0)
Eosinophils Absolute: 0.1 10*3/uL (ref 0.0–0.5)
MCH: 25.3 pg (ref 25.1–34.0)
MCHC: 31.7 g/dL (ref 31.5–36.0)
MCV: 79.9 fL (ref 79.5–101.0)
MONO#: 0.4 10*3/uL (ref 0.1–0.9)
Platelets: 156 10*3/uL (ref 145–400)
RBC: 4.58 10*6/uL (ref 3.70–5.45)
WBC: 3.5 10*3/uL — ABNORMAL LOW (ref 3.9–10.3)

## 2011-01-29 LAB — COMPREHENSIVE METABOLIC PANEL
Albumin: 3.1 g/dL — ABNORMAL LOW (ref 3.5–5.2)
Alkaline Phosphatase: 46 U/L (ref 39–117)
CO2: 23 mEq/L (ref 19–32)
Calcium: 7.9 mg/dL — ABNORMAL LOW (ref 8.4–10.5)
Chloride: 106 mEq/L (ref 96–112)
Glucose, Bld: 96 mg/dL (ref 70–99)
Potassium: 4.1 mEq/L (ref 3.5–5.3)
Sodium: 136 mEq/L (ref 135–145)
Total Protein: 4.9 g/dL — ABNORMAL LOW (ref 6.0–8.3)

## 2011-02-05 ENCOUNTER — Encounter (HOSPITAL_BASED_OUTPATIENT_CLINIC_OR_DEPARTMENT_OTHER): Payer: Medicare Other | Admitting: Hematology and Oncology

## 2011-02-05 ENCOUNTER — Other Ambulatory Visit: Payer: Self-pay | Admitting: Hematology and Oncology

## 2011-02-05 DIAGNOSIS — K759 Inflammatory liver disease, unspecified: Secondary | ICD-10-CM

## 2011-02-05 DIAGNOSIS — D539 Nutritional anemia, unspecified: Secondary | ICD-10-CM

## 2011-02-05 DIAGNOSIS — C160 Malignant neoplasm of cardia: Secondary | ICD-10-CM

## 2011-02-05 DIAGNOSIS — C779 Secondary and unspecified malignant neoplasm of lymph node, unspecified: Secondary | ICD-10-CM

## 2011-02-05 DIAGNOSIS — Z5111 Encounter for antineoplastic chemotherapy: Secondary | ICD-10-CM

## 2011-02-05 LAB — CBC WITH DIFFERENTIAL/PLATELET
BASO%: 0.8 % (ref 0.0–2.0)
LYMPH%: 15.4 % (ref 14.0–49.7)
MCH: 25.1 pg (ref 25.1–34.0)
MCHC: 32.1 g/dL (ref 31.5–36.0)
MCV: 78.3 fL — ABNORMAL LOW (ref 79.5–101.0)
MONO%: 19.2 % — ABNORMAL HIGH (ref 0.0–14.0)
NEUT%: 60 % (ref 38.4–76.8)
Platelets: 138 10*3/uL — ABNORMAL LOW (ref 145–400)
RBC: 4.14 10*6/uL (ref 3.70–5.45)
nRBC: 0 % (ref 0–0)

## 2011-02-11 ENCOUNTER — Other Ambulatory Visit: Payer: Self-pay | Admitting: Hematology and Oncology

## 2011-02-11 ENCOUNTER — Encounter (HOSPITAL_BASED_OUTPATIENT_CLINIC_OR_DEPARTMENT_OTHER): Payer: Medicare Other | Admitting: Hematology and Oncology

## 2011-02-11 DIAGNOSIS — C169 Malignant neoplasm of stomach, unspecified: Secondary | ICD-10-CM

## 2011-02-11 DIAGNOSIS — C160 Malignant neoplasm of cardia: Secondary | ICD-10-CM

## 2011-02-11 DIAGNOSIS — K759 Inflammatory liver disease, unspecified: Secondary | ICD-10-CM

## 2011-02-11 DIAGNOSIS — D539 Nutritional anemia, unspecified: Secondary | ICD-10-CM

## 2011-02-11 LAB — COMPREHENSIVE METABOLIC PANEL
ALT: 17 U/L (ref 0–35)
AST: 17 U/L (ref 0–37)
Alkaline Phosphatase: 67 U/L (ref 39–117)
Calcium: 8.9 mg/dL (ref 8.4–10.5)
Chloride: 102 mEq/L (ref 96–112)
Creatinine, Ser: 0.62 mg/dL (ref 0.50–1.10)
Potassium: 4.2 mEq/L (ref 3.5–5.3)

## 2011-02-11 LAB — CBC WITH DIFFERENTIAL/PLATELET
BASO%: 0 % (ref 0.0–2.0)
EOS%: 0.6 % (ref 0.0–7.0)
MCH: 26.2 pg (ref 25.1–34.0)
MCHC: 32.7 g/dL (ref 31.5–36.0)
MCV: 80 fL (ref 79.5–101.0)
MONO%: 11.5 % (ref 0.0–14.0)
NEUT%: 84.4 % — ABNORMAL HIGH (ref 38.4–76.8)
RDW: 21.9 % — ABNORMAL HIGH (ref 11.2–14.5)
lymph#: 0.1 10*3/uL — ABNORMAL LOW (ref 0.9–3.3)

## 2011-02-12 ENCOUNTER — Encounter (HOSPITAL_BASED_OUTPATIENT_CLINIC_OR_DEPARTMENT_OTHER): Payer: Medicare Other | Admitting: Hematology and Oncology

## 2011-02-12 DIAGNOSIS — C160 Malignant neoplasm of cardia: Secondary | ICD-10-CM

## 2011-02-12 DIAGNOSIS — C779 Secondary and unspecified malignant neoplasm of lymph node, unspecified: Secondary | ICD-10-CM

## 2011-02-12 DIAGNOSIS — Z5111 Encounter for antineoplastic chemotherapy: Secondary | ICD-10-CM

## 2011-02-19 ENCOUNTER — Encounter (HOSPITAL_BASED_OUTPATIENT_CLINIC_OR_DEPARTMENT_OTHER): Payer: Medicare Other | Admitting: Hematology and Oncology

## 2011-02-19 ENCOUNTER — Other Ambulatory Visit: Payer: Self-pay | Admitting: Hematology and Oncology

## 2011-02-19 DIAGNOSIS — Z5111 Encounter for antineoplastic chemotherapy: Secondary | ICD-10-CM

## 2011-02-19 DIAGNOSIS — C779 Secondary and unspecified malignant neoplasm of lymph node, unspecified: Secondary | ICD-10-CM

## 2011-02-19 DIAGNOSIS — Z452 Encounter for adjustment and management of vascular access device: Secondary | ICD-10-CM

## 2011-02-19 DIAGNOSIS — R110 Nausea: Secondary | ICD-10-CM

## 2011-02-19 DIAGNOSIS — C160 Malignant neoplasm of cardia: Secondary | ICD-10-CM

## 2011-02-19 LAB — CBC WITH DIFFERENTIAL/PLATELET
BASO%: 1.6 % (ref 0.0–2.0)
Basophils Absolute: 0.1 10*3/uL (ref 0.0–0.1)
EOS%: 1.3 % (ref 0.0–7.0)
HCT: 32.5 % — ABNORMAL LOW (ref 34.8–46.6)
HGB: 10.4 g/dL — ABNORMAL LOW (ref 11.6–15.9)
LYMPH%: 8.8 % — ABNORMAL LOW (ref 14.0–49.7)
MCH: 25.1 pg (ref 25.1–34.0)
MCHC: 32 g/dL (ref 31.5–36.0)
MCV: 78.3 fL — ABNORMAL LOW (ref 79.5–101.0)
MONO%: 25.2 % — ABNORMAL HIGH (ref 0.0–14.0)
NEUT%: 63.1 % (ref 38.4–76.8)
Platelets: 276 10*3/uL (ref 145–400)
lymph#: 0.3 10*3/uL — ABNORMAL LOW (ref 0.9–3.3)

## 2011-02-27 ENCOUNTER — Other Ambulatory Visit: Payer: Self-pay | Admitting: Hematology and Oncology

## 2011-02-27 ENCOUNTER — Encounter (HOSPITAL_BASED_OUTPATIENT_CLINIC_OR_DEPARTMENT_OTHER): Payer: Managed Care, Other (non HMO) | Admitting: Hematology and Oncology

## 2011-02-27 DIAGNOSIS — D539 Nutritional anemia, unspecified: Secondary | ICD-10-CM

## 2011-02-27 DIAGNOSIS — K759 Inflammatory liver disease, unspecified: Secondary | ICD-10-CM

## 2011-02-27 DIAGNOSIS — Z79899 Other long term (current) drug therapy: Secondary | ICD-10-CM

## 2011-02-27 DIAGNOSIS — Z923 Personal history of irradiation: Secondary | ICD-10-CM

## 2011-02-27 DIAGNOSIS — Z5111 Encounter for antineoplastic chemotherapy: Secondary | ICD-10-CM

## 2011-02-27 DIAGNOSIS — C779 Secondary and unspecified malignant neoplasm of lymph node, unspecified: Secondary | ICD-10-CM

## 2011-02-27 DIAGNOSIS — C160 Malignant neoplasm of cardia: Secondary | ICD-10-CM

## 2011-02-27 LAB — BASIC METABOLIC PANEL
BUN: 10 mg/dL (ref 6–23)
CO2: 24 mEq/L (ref 19–32)
Chloride: 103 mEq/L (ref 96–112)
Creatinine, Ser: 0.57 mg/dL (ref 0.50–1.10)
Glucose, Bld: 115 mg/dL — ABNORMAL HIGH (ref 70–99)

## 2011-02-27 LAB — CBC WITH DIFFERENTIAL/PLATELET
Basophils Absolute: 0.1 10*3/uL (ref 0.0–0.1)
EOS%: 1 % (ref 0.0–7.0)
Eosinophils Absolute: 0 10*3/uL (ref 0.0–0.5)
HGB: 9.7 g/dL — ABNORMAL LOW (ref 11.6–15.9)
LYMPH%: 26.6 % (ref 14.0–49.7)
MCH: 24.7 pg — ABNORMAL LOW (ref 25.1–34.0)
MCV: 78.1 fL — ABNORMAL LOW (ref 79.5–101.0)
MONO%: 27.2 % — ABNORMAL HIGH (ref 0.0–14.0)
NEUT#: 1.4 10*3/uL — ABNORMAL LOW (ref 1.5–6.5)
NEUT%: 43.6 % (ref 38.4–76.8)
Platelets: 223 10*3/uL (ref 145–400)

## 2011-02-28 ENCOUNTER — Encounter (INDEPENDENT_AMBULATORY_CARE_PROVIDER_SITE_OTHER): Payer: Medicare Other | Admitting: General Surgery

## 2011-03-10 NOTE — Progress Notes (Unsigned)
I called Ms. [name] on the telephone.  She reports she is doing pretty well.  She has tolerated Vital 1.5 via her continuous feeding tube jejunostomy tube at 60 mL an hour.  She reports that she is running it so that she receives 4 cans a day, which is actually longer than what I had originally intended for her to run it at.  However, the patient does feel like she tolerates a lower rate better than a faster rate for a shorter amount of time.  Four cans of Vital 1.5 provide 1420 calories, 64 g of protein, and 724 mL of free water.  The patient reports she is eating and drinking throughout the day as well.  She drinks a little almond milk, which she tolerates.  I have discussed with her today the importance of continuing to eat as tolerated to provide additional calories and protein, as her Vital will not provide 100% of her needs. Tube feeding will provide approximately 85% of patient's estimated nutrition needs.  I have suggested that the patient consider increasing her rate to 70 mL an hour if she feels comfortable.  I am not sure the patient is at this point yet, being as her diarrhea has just recently resolved and she is just beginning to feel good.  NUTRITION DIAGNOSIS:  Unintended weight loss and less than optimal enteral nutrition infusion improved.  INTERVENTION:  The patient will continue Vital 1.5 four cans daily to run the rate at approximately 60 mL an hour or 70 mL an hour if tolerated.  The patient verbalizes understanding.  MONITORING, EVALUATION, AND GOALS:  The patient will tolerate Vital 1.5 to promote weight gain.  She has had improved diarrhea with this change in formula.  NEXT VISIT:  I will follow up with the patient by phone next week to discuss tube feedings, tolerance, and changes necessary.    ______________________________ Zenovia Jarred, RD, LDN Clinical Nutrition Specialist BN/MEDQ  D:  03/07/2011  T:  03/10/2011  Job:  [id number]

## 2011-03-21 ENCOUNTER — Encounter: Payer: Self-pay | Admitting: *Deleted

## 2011-03-24 ENCOUNTER — Telehealth (INDEPENDENT_AMBULATORY_CARE_PROVIDER_SITE_OTHER): Payer: Self-pay

## 2011-03-24 NOTE — Telephone Encounter (Signed)
Pt called requesting sooner appt than Dec.. Pt states tube is moving more and pt concerned it may come out. I have directed pt on how to tape and secure tubing until she can see FB. I will send pt to VM and a note to [facility] to see if she can be seen sooner.

## 2011-03-25 ENCOUNTER — Encounter (INDEPENDENT_AMBULATORY_CARE_PROVIDER_SITE_OTHER): Payer: Self-pay | Admitting: Surgery

## 2011-03-25 ENCOUNTER — Ambulatory Visit (INDEPENDENT_AMBULATORY_CARE_PROVIDER_SITE_OTHER): Payer: Medicare Other | Admitting: Surgery

## 2011-03-25 ENCOUNTER — Encounter: Payer: Self-pay | Admitting: *Deleted

## 2011-03-25 VITALS — BP 108/82 | HR 100 | Temp 98.4°F | Resp 16 | Ht 59.0 in | Wt 122.4 lb

## 2011-03-25 DIAGNOSIS — C160 Malignant neoplasm of cardia: Secondary | ICD-10-CM

## 2011-03-25 NOTE — Progress Notes (Signed)
[facility]  Kaitlyn Kin, MD,  FACS [address]  [address] [place], [place]    [address] Phone:  [phone] FAX:  [phone]   Re:   [name] DOB:   1930/08/27 MRN:   [id number]  ASSESSMENT AND PLAN: 1.  Cancer of the gastroesophageal junction, pT4aN2, s/p esophagogastrectomy, J tube 11/08/2010  She has completed chemotx by Dr. Dalene Carrow and radiation tx by Dr. Mitzi Hansen.  2.  Feeding jejunostomy tube which is loose.  Sutures have come out. The tube is sliding.  I resutured the tube in today with 3-0 nylon.  She's taking 32 oz./night for nutrition.  It sounds her oral intake is getting close enough to remove the J tube.  She is to see Dr. Donell Beers 12/14.    HISTORY OF PRESENT ILLNESS: Chief Complaint  Patient presents with  . Other    check feeding tube keeps coming out     [name] is a 77 y.o. (DOB: 1931-03-06)  AA female who is a patient of HUSAIN,KARRAR, MD and comes to me today for evaluate the jejunostomy tube.  [name] had a esophagogastrectomy a gastric cancer, has completed chemotx/radiation tx, has a J tube for nutritional purposes.  She uses a pump to get in 32 oz of tube feeding nightly.  The sutures holding the tube have come out and the tube is trying to slide out.  She has mild pain around the entry site of the J tube.  PHYSICAL EXAM: BP 108/82  Pulse 100  Temp(Src) 98.4 F (36.9 C) (Temporal)  Resp 16  Ht 4\' 11"  (1.499 m)  Wt 122 lb 6 oz (55.509 kg)  BMI 24.72 kg/m2  Abdomen:  Well healed upper abdominal incisions.  J tube in LUQ.  Procedure:  The area around the J tube was painted with betadine.  I infiltrated 5cc of 1% xylocaine.  I used two 3-0 nylon sutures to hold the J tube in place.  The patient tolerated the procedure well.  DATA REVIEWED: Old notes from Dr. Donell Beers.   Kaitlyn Kin, MD, FACS Office:  [phone]

## 2011-03-31 ENCOUNTER — Other Ambulatory Visit: Payer: Self-pay | Admitting: Hematology and Oncology

## 2011-03-31 ENCOUNTER — Ambulatory Visit (HOSPITAL_BASED_OUTPATIENT_CLINIC_OR_DEPARTMENT_OTHER): Payer: Medicare Other

## 2011-03-31 ENCOUNTER — Other Ambulatory Visit (HOSPITAL_BASED_OUTPATIENT_CLINIC_OR_DEPARTMENT_OTHER): Payer: Medicare Other | Admitting: Lab

## 2011-03-31 ENCOUNTER — Ambulatory Visit (HOSPITAL_COMMUNITY)
Admission: RE | Admit: 2011-03-31 | Discharge: 2011-03-31 | Disposition: A | Discharge status: Home / Self Care | Payer: Medicare Other | Source: Ambulatory Visit | Attending: Hematology and Oncology | Admitting: Hematology and Oncology

## 2011-03-31 DIAGNOSIS — Z452 Encounter for adjustment and management of vascular access device: Secondary | ICD-10-CM

## 2011-03-31 DIAGNOSIS — Z90710 Acquired absence of both cervix and uterus: Secondary | ICD-10-CM | POA: Insufficient documentation

## 2011-03-31 DIAGNOSIS — C169 Malignant neoplasm of stomach, unspecified: Secondary | ICD-10-CM

## 2011-03-31 DIAGNOSIS — C160 Malignant neoplasm of cardia: Secondary | ICD-10-CM

## 2011-03-31 DIAGNOSIS — K759 Inflammatory liver disease, unspecified: Secondary | ICD-10-CM

## 2011-03-31 DIAGNOSIS — Z5111 Encounter for antineoplastic chemotherapy: Secondary | ICD-10-CM

## 2011-03-31 DIAGNOSIS — D539 Nutritional anemia, unspecified: Secondary | ICD-10-CM

## 2011-03-31 DIAGNOSIS — Z903 Acquired absence of stomach [part of]: Secondary | ICD-10-CM | POA: Insufficient documentation

## 2011-03-31 LAB — COMPREHENSIVE METABOLIC PANEL
AST: 26 U/L (ref 0–37)
Albumin: 3.8 g/dL (ref 3.5–5.2)
Alkaline Phosphatase: 129 U/L — ABNORMAL HIGH (ref 39–117)
BUN: 14 mg/dL (ref 6–23)
Creatinine, Ser: 0.61 mg/dL (ref 0.50–1.10)
Glucose, Bld: 104 mg/dL — ABNORMAL HIGH (ref 70–99)

## 2011-03-31 LAB — CBC WITH DIFFERENTIAL/PLATELET
BASO%: 0.3 % (ref 0.0–2.0)
Basophils Absolute: 0 10*3/uL (ref 0.0–0.1)
EOS%: 2.4 % (ref 0.0–7.0)
HCT: 35.9 % (ref 34.8–46.6)
HGB: 11.2 g/dL — ABNORMAL LOW (ref 11.6–15.9)
LYMPH%: 25.6 % (ref 14.0–49.7)
MCH: 25.5 pg (ref 25.1–34.0)
MCHC: 31.2 g/dL — ABNORMAL LOW (ref 31.5–36.0)
MCV: 81.6 fL (ref 79.5–101.0)
MONO%: 27.8 % — ABNORMAL HIGH (ref 0.0–14.0)
NEUT%: 43.9 % (ref 38.4–76.8)
Platelets: 287 10*3/uL (ref 145–400)
lymph#: 1.8 10*3/uL (ref 0.9–3.3)

## 2011-03-31 MED ORDER — SODIUM CHLORIDE 0.9 % IJ SOLN
10.0000 mL | Freq: Once | INTRAMUSCULAR | Status: AC
  Administered 2011-03-31: 10 mL via INTRAVENOUS
  Filled 2011-03-31: qty 10

## 2011-03-31 MED ORDER — IOHEXOL 300 MG/ML  SOLN
100.0000 mL | Freq: Once | INTRAMUSCULAR | Status: AC | PRN
  Administered 2011-03-31: 100 mL via INTRAVENOUS

## 2011-03-31 MED ORDER — HEPARIN SOD (PORK) LOCK FLUSH 100 UNIT/ML IV SOLN
500.0000 [IU] | Freq: Once | INTRAVENOUS | Status: AC
  Administered 2011-03-31: 500 [IU] via INTRAVENOUS
  Filled 2011-03-31: qty 5

## 2011-03-31 NOTE — Patient Instructions (Signed)
Patient instructed to return to lab for lab draw as no blood return from port.  Specials notified.

## 2011-04-02 ENCOUNTER — Encounter: Payer: Self-pay | Admitting: Radiation Oncology

## 2011-04-02 ENCOUNTER — Telehealth: Payer: Self-pay | Admitting: Hematology and Oncology

## 2011-04-02 ENCOUNTER — Ambulatory Visit (HOSPITAL_BASED_OUTPATIENT_CLINIC_OR_DEPARTMENT_OTHER): Payer: Medicare Other | Admitting: Hematology and Oncology

## 2011-04-02 VITALS — BP 118/76 | HR 113 | Temp 96.6°F | Ht 59.0 in | Wt 123.5 lb

## 2011-04-02 DIAGNOSIS — C169 Malignant neoplasm of stomach, unspecified: Secondary | ICD-10-CM

## 2011-04-02 DIAGNOSIS — D490 Neoplasm of unspecified behavior of digestive system: Secondary | ICD-10-CM

## 2011-04-02 DIAGNOSIS — R630 Anorexia: Secondary | ICD-10-CM

## 2011-04-02 DIAGNOSIS — C772 Secondary and unspecified malignant neoplasm of intra-abdominal lymph nodes: Secondary | ICD-10-CM

## 2011-04-02 DIAGNOSIS — C160 Malignant neoplasm of cardia: Secondary | ICD-10-CM

## 2011-04-02 MED ORDER — ALTEPLASE 2 MG IJ SOLR
2.0000 mg | Freq: Once | INTRAMUSCULAR | Status: AC | PRN
  Filled 2011-04-02: qty 2

## 2011-04-02 MED ORDER — HEPARIN SOD (PORK) LOCK FLUSH 100 UNIT/ML IV SOLN
500.0000 [IU] | Freq: Once | INTRAVENOUS | Status: DC
  Filled 2011-04-02: qty 5

## 2011-04-02 MED ORDER — SODIUM CHLORIDE 0.9 % IJ SOLN
10.0000 mL | INTRAMUSCULAR | Status: DC | PRN
  Filled 2011-04-02: qty 10

## 2011-04-02 NOTE — Progress Notes (Signed)
CC:   [name], MD Radene Gunning, M.D., Ph.D. Almond Lint, MD  IDENTIFYING STATEMENT:  The patient is a 77 year old woman with gastric cancer who presents for followup to discuss scans.  INTERIM HISTORY:  Mrs. [name] completed chemo and radiation therapy. She did fairly well throughout therapy.  However this week she states that she has had a bout of loose stools after drinking contrast for her CT scan. She is drinking adequate fluids.  She does note anorexia.  Her weight is stable.  She has no nausea or vomiting.  She is afebrile.   We reviewed results of CT scan of the chest, abdomen, pelvis on 03/31/2011 showed no evidence of gastric metastases within the chest, abdomen, or pelvis.     Medications reviewed and updated.  ALLERGIES:  None.  PAST MEDICAL HISTORY/SOCIAL HISTORY/FAMILY HISTORY:  Unchanged.  PHYSICAL EXAMINATION:  The patient is alert and oriented x3.  Vitals: Pulse 113, blood pressure 118/76, temperature 96.6, respirations 22, weight 123 (122).  HEENT:  Head is atraumatic, normocephalic.  Sclerae anicteric.  Mouth moist.  Chest:  Clear.  Cardiovascular:  Unremarkable. Abdomen:  Soft, nontender.  Bowel sounds present.  Extremities:  No edema.  Lymph nodes:  No palpable adenopathy.  CNS:  Nonfocal.  LABORATORY DATA:  03/31/2011 white cell count 7.2, hemoglobin 11.2, hematocrit 35.9, platelets 287.  Sodium 138, potassium 4.3, chloride 103, CO2 22, BUN 14, creatinine 0.61, glucose 104.  T bilirubin 0.2, alkaline phosphatase 129, AST 26, ALT 22, calcium 10.    IMPRESSION AND PLAN:  [name] is a 77 year old woman who is status post gastrectomy with feeding jejunostomy on 11/08/2010 for T4 N2 well- differentiated adenocarcinoma of the cardia.  4 of 5 lymph nodes sampled had evidence of malignancy.  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  She completed Taxol with carboplatin given weekly with radiation therapy from 01/13/2011 through 02/10/2011.  Her  current staging workup indicates no evidence of recurrence.  [name] will assume surveillance.  She returns in 4 months' time with a CT scan of the abdomen and pelvis, chest x-ray and blood work.  I have also referred her for endoscopy at around that time.  She will continue medical followup with Dr. Donette Larry.  She was also given a prescription for an appetite stimulant, Megace. In the summer, she will require referral to GI for endoscopy.    ______________________________ Laurice Record, M.D. LIO/MEDQ  D:  04/02/2011  T:  04/02/2011  Job:  [id number]

## 2011-04-02 NOTE — Progress Notes (Signed)
This office note has been dictated.

## 2011-04-02 NOTE — Telephone Encounter (Signed)
Filled out form to HIM for pt referral to Dr Danise Edge

## 2011-04-03 DIAGNOSIS — C169 Malignant neoplasm of stomach, unspecified: Secondary | ICD-10-CM | POA: Insufficient documentation

## 2011-04-04 ENCOUNTER — Ambulatory Visit
Admission: RE | Admit: 2011-04-04 | Discharge: 2011-04-04 | Disposition: A | Discharge status: Home / Self Care | Payer: Medicare Other | Source: Ambulatory Visit | Attending: Radiation Oncology | Admitting: Radiation Oncology

## 2011-04-04 ENCOUNTER — Encounter: Payer: Self-pay | Admitting: Radiation Oncology

## 2011-04-04 DIAGNOSIS — C160 Malignant neoplasm of cardia: Secondary | ICD-10-CM

## 2011-04-04 NOTE — Progress Notes (Signed)
Patient presents to the clinic today unaccompanied for follow up appointment with Dr. Mitzi Hansen to obtain results of recent CT scan. Patient is alert and oriented to person, place, and time. No distress noted. Steady gait noted. Pleasant affect noted. Patient denies pain. Patient reports nausea and diarrhea have resolved. Patient reports taking in 4 cans of Vital via her jejunostomy tube daily. Patient reports indigestion. Patient reports that she has stopped taking Reglan and Protonix because she wasn't sure what they were for. Educated patient about these drug and she verbalized she will begin taking them again to relieve her indigestion. Patient reports that recently she was prescribed Megace for her appetite and that it helps.

## 2011-04-04 NOTE — Progress Notes (Signed)
CC:   [name], M.D. Kaitlyn Lint, MD Kaitlyn Housekeeper, MD  DIAGNOSIS:  Gastric cancer, T4a N2 M0.  INTERVAL HISTORY:  [name] returns to clinic today for followup. She completed her course of radiation on 02/20/2011.  The patient did undergo concurrent chemotherapy through Dr. Dalene Carrow.  She recently had repeat imaging and her CT scan of the chest, abdomen, and pelvis did not show any evidence of recurrence or metastatic disease.  The patient states that she is doing quite well.  She did have some nausea, which has resolved as well as some loose stools.  She is using her G-tube, including 4 cans a day without incident.  PHYSICAL EXAMINATION:  Vital Signs:  Weight 125 pounds, blood pressure 133/86, pulse 88, temperature 98.  Cardiovascular:  Regular rate and rhythm.  Respiratory:  Clear to auscultation bilaterally.  GI:  Abdomen is soft, nontender.  The G-tube is bandaged.  Extremities:  No edema present.  IMPRESSION AND PLAN:  [name] is doing well and is recovering adequately from her course of radiation treatment, which she finished on 02/20/2011.  She is scheduled for repeat scans in several months with Dr. Dalene Carrow and I will have her return to our clinic in 6 months for ongoing followup.    ______________________________ Radene Gunning, M.D., Ph.D. JSM/MEDQ  D:  04/04/2011  T:  04/04/2011  Job:  [id number]

## 2011-04-18 ENCOUNTER — Ambulatory Visit (INDEPENDENT_AMBULATORY_CARE_PROVIDER_SITE_OTHER): Payer: Medicare Other | Admitting: General Surgery

## 2011-04-18 VITALS — BP 128/80 | HR 120 | Temp 97.6°F | Resp 14 | Ht 59.0 in | Wt 120.0 lb

## 2011-04-18 DIAGNOSIS — C160 Malignant neoplasm of cardia: Secondary | ICD-10-CM

## 2011-04-18 MED ORDER — PROMETHAZINE HCL 25 MG PO TABS: 25.0000 mg | ORAL_TABLET | Freq: Every day | ORAL | Status: AC

## 2011-04-18 NOTE — Progress Notes (Signed)
HISTORY: Pt complaining of nausea at night during tube feeds.  She states that she gets left sided pain, then throws up.  This does not occur every night, but does happen several nights per week.  She is not having fevers/ chills.  She eats around 1 good meal/day at lunchtime, then takes 4 cans ensure at night over around 12 hours.  She cannot tolerate any higher amount of feeds.  This is interfering with her sleep.  She does not vomit up the food that she is eating.  She throws up foamy material.    PERTINENT REVIEW OF SYSTEMS: Short of breath.     EXAM: Head: Normocephalic and atraumatic.  Eyes:  Conjunctivae are normal. Pupils are equal, round, and reactive to light. No scleral icterus.  Neck:  Normal range of motion. Neck supple. No tracheal deviation present. No thyromegaly present.  Resp: No respiratory distress, normal effort. Abd:  Abdomen is soft, non distended and non tender. No masses are palpable.  There is no rebound and no guarding. J tube site is OK.  There is no sign of hernia.   Neurological: Alert and oriented to person, place, and time. Coordination normal.  Skin: Skin is warm and dry. No rash noted. No diaphoretic. No erythema. No pallor.  Psychiatric: Normal mood and affect. Normal behavior. Judgment and thought content normal.    CT scan end of November demonstrates no sign of obstruction.    ASSESSMENT AND PLAN:   Cancer of the gastroesophageal junction, pT4aN2, s/p esophagogastrectomy, J tube 11/08/2010 Nausea/vomiting at night in the middle of tube feeds.   Try cutting back to 3 cans ensure at night. Try phenergan at night before bed.   Needs to not lose weight, but doesn't have to gain weight.    Will d/c port a cath.         Maudry Diego, MD Surgical Oncology, General & Endocrine Surgery [facility]  [name], MD [name], MD

## 2011-04-18 NOTE — Patient Instructions (Signed)
Decrease tube feeds to 3 cans at night.  Take phenergan (new nausea medicine) at bedtime.  Attempt to eat more during day.

## 2011-04-18 NOTE — Assessment & Plan Note (Signed)
Nausea/vomiting at night in the middle of tube feeds.   Try cutting back to 3 cans ensure at night. Try phenergan at night before bed.   Needs to not lose weight, but doesn't have to gain weight.    Will d/c port a cath.

## 2011-04-25 ENCOUNTER — Encounter (HOSPITAL_COMMUNITY): Payer: Self-pay

## 2011-05-08 ENCOUNTER — Telehealth (INDEPENDENT_AMBULATORY_CARE_PROVIDER_SITE_OTHER): Payer: Self-pay | Admitting: General Surgery

## 2011-05-08 NOTE — Telephone Encounter (Signed)
[name] with pre-admit called the office stating that the orders are not in for the patients port a cath removal.

## 2011-05-09 ENCOUNTER — Other Ambulatory Visit (INDEPENDENT_AMBULATORY_CARE_PROVIDER_SITE_OTHER): Payer: Self-pay | Admitting: General Surgery

## 2011-05-09 ENCOUNTER — Encounter (HOSPITAL_COMMUNITY): Payer: Self-pay

## 2011-05-09 ENCOUNTER — Encounter (HOSPITAL_COMMUNITY)
Admission: RE | Admit: 2011-05-09 | Discharge: 2011-05-09 | Disposition: A | Discharge status: Home / Self Care | Payer: Medicare Other | Source: Ambulatory Visit | Attending: General Surgery | Admitting: General Surgery

## 2011-05-09 ENCOUNTER — Encounter (HOSPITAL_COMMUNITY)
Admission: RE | Admit: 2011-05-09 | Discharge: 2011-05-09 | Disposition: A | Discharge status: Home / Self Care | Payer: Medicare Other | Source: Ambulatory Visit | Attending: Anesthesiology | Admitting: Anesthesiology

## 2011-05-09 HISTORY — DX: Shortness of breath: R06.02

## 2011-05-09 LAB — BASIC METABOLIC PANEL
CO2: 21 mEq/L (ref 19–32)
Calcium: 10.1 mg/dL (ref 8.4–10.5)
Chloride: 106 mEq/L (ref 96–112)
Glucose, Bld: 93 mg/dL (ref 70–99)
Sodium: 138 mEq/L (ref 135–145)

## 2011-05-09 LAB — CBC
HCT: 40.2 % (ref 36.0–46.0)
Hemoglobin: 13 g/dL (ref 12.0–15.0)
MCH: 27 pg (ref 26.0–34.0)
MCV: 83.6 fL (ref 78.0–100.0)
RBC: 4.81 MIL/uL (ref 3.87–5.11)
WBC: 7.7 10*3/uL (ref 4.0–10.5)

## 2011-05-09 NOTE — Pre-Procedure Instructions (Signed)
20 [name]  05/09/2011   Your procedure is scheduled on: Tuesday 05/13/11   Report to [facility] at 940 AM.  Call this number if you have problems the morning of surgery: [phone]   Remember:   Do not eat food:After Midnight.  May have clear liquids: up to 4 Hours before arrival.  Clear liquids include soda, tea, black coffee, apple or grape juice, broth.  Take these medicines the morning of surgery with A SIP OF WATER:  REGLAN,    Do not wear jewelry, make-up or nail polish.  Do not wear lotions, powders, or perfumes. You may wear deodorant.  Do not shave 48 hours prior to surgery.  Do not bring valuables to the hospital.  Contacts, dentures or bridgework may not be worn into surgery.  Leave suitcase in the car. After surgery it may be brought to your room.  For patients admitted to the hospital, checkout time is 11:00 AM the day of discharge.   Patients discharged the day of surgery will not be allowed to drive home.  Name and phone number of your driver:   Special Instructions: CHG Shower Use Special Wash: 1/2 bottle night before surgery and 1/2 bottle morning of surgery.   Please read over the following fact sheets that you were given: Pain Booklet, MRSA Information and Surgical Site Infection Prevention   STOP ASPIRIN, COUMADIN, PLAVIX, EFFIENT, HERBAL MEDICINES

## 2011-05-12 MED ORDER — CEFAZOLIN SODIUM 1-5 GM-% IV SOLN
1.0000 g | INTRAVENOUS | Status: AC
  Administered 2011-05-13: 1 g via INTRAVENOUS
  Filled 2011-05-12: qty 50

## 2011-05-12 NOTE — Consult Note (Signed)
Anesthesia:  Patient is a 78 year old female scheduled for removal of a Port-a-cath on 05/13/11.  She is s/p esophagogastrectomy, J-tube on7/6/12 for cancer of the GE junction and now s/p chemo and radiation.  CT scan on 03/31/11 showed no evidence of metastases within the chest, abdomen, or pelvis.  Other history includes COPD, anemia, OA, and dyslipidemia.    I was asked to review her preoperative CXR showing trace left pleural effusion with left basilar atelectasis or  infiltrate. No pulmonary edema.  Clinical correlation on the day of surgery.  If no worrisome respiratory symptoms then anticipate that she can proceed.  EKG from 09/23/10 showed NSR.  Labs also noted.

## 2011-05-13 ENCOUNTER — Ambulatory Visit (HOSPITAL_COMMUNITY)
Admission: RE | Admit: 2011-05-13 | Discharge: 2011-05-13 | Disposition: A | Discharge status: Home / Self Care | Payer: Medicare Other | Source: Ambulatory Visit | Attending: General Surgery | Admitting: General Surgery

## 2011-05-13 ENCOUNTER — Encounter (HOSPITAL_COMMUNITY): Payer: Self-pay | Admitting: Vascular Surgery

## 2011-05-13 ENCOUNTER — Encounter (HOSPITAL_COMMUNITY): Payer: Self-pay | Admitting: General Surgery

## 2011-05-13 ENCOUNTER — Ambulatory Visit (HOSPITAL_COMMUNITY): Payer: Medicare Other | Admitting: Vascular Surgery

## 2011-05-13 ENCOUNTER — Encounter (HOSPITAL_COMMUNITY)
Admission: RE | Disposition: A | Discharge status: Home / Self Care | Payer: Self-pay | Source: Ambulatory Visit | Attending: General Surgery

## 2011-05-13 DIAGNOSIS — Z4682 Encounter for fitting and adjustment of non-vascular catheter: Secondary | ICD-10-CM | POA: Insufficient documentation

## 2011-05-13 DIAGNOSIS — Z01818 Encounter for other preprocedural examination: Secondary | ICD-10-CM | POA: Insufficient documentation

## 2011-05-13 DIAGNOSIS — Z01812 Encounter for preprocedural laboratory examination: Secondary | ICD-10-CM | POA: Insufficient documentation

## 2011-05-13 DIAGNOSIS — Z452 Encounter for adjustment and management of vascular access device: Secondary | ICD-10-CM

## 2011-05-13 HISTORY — PX: PORT-A-CATH REMOVAL: SHX5289

## 2011-05-13 SURGERY — REMOVAL PORT-A-CATH
Anesthesia: Choice | Site: Chest | Wound class: Clean

## 2011-05-13 MED ORDER — 0.9 % SODIUM CHLORIDE (POUR BTL) OPTIME
TOPICAL | Status: DC | PRN
  Administered 2011-05-13: 1000 mL

## 2011-05-13 MED ORDER — PROPOFOL 10 MG/ML IV EMUL
INTRAVENOUS | Status: DC | PRN
  Administered 2011-05-13: 20 mg via INTRAVENOUS

## 2011-05-13 MED ORDER — DROPERIDOL 2.5 MG/ML IJ SOLN: 0.6250 mg | INTRAMUSCULAR | Status: DC | PRN

## 2011-05-13 MED ORDER — LIDOCAINE HCL 1 % IJ SOLN
INTRAMUSCULAR | Status: DC | PRN
  Administered 2011-05-13: 12:00:00

## 2011-05-13 MED ORDER — LACTATED RINGERS IV SOLN
INTRAVENOUS | Status: DC | PRN
  Administered 2011-05-13: 11:00:00 via INTRAVENOUS

## 2011-05-13 MED ORDER — MIDAZOLAM HCL 5 MG/5ML IJ SOLN
INTRAMUSCULAR | Status: DC | PRN
  Administered 2011-05-13 (×2): 1 mg via INTRAVENOUS

## 2011-05-13 MED ORDER — HYDROMORPHONE HCL PF 1 MG/ML IJ SOLN: 0.2500 mg | INTRAMUSCULAR | Status: DC | PRN

## 2011-05-13 MED ORDER — ONDANSETRON HCL 4 MG/2ML IJ SOLN
INTRAMUSCULAR | Status: DC | PRN
  Administered 2011-05-13: 4 mg via INTRAVENOUS

## 2011-05-13 MED ORDER — OXYCODONE-ACETAMINOPHEN 5-325 MG PO TABS: 1.0000 | ORAL_TABLET | ORAL | Status: DC | PRN

## 2011-05-13 MED ORDER — FENTANYL CITRATE 0.05 MG/ML IJ SOLN
INTRAMUSCULAR | Status: DC | PRN
  Administered 2011-05-13 (×2): 25 ug via INTRAVENOUS

## 2011-05-13 SURGICAL SUPPLY — 44 items
ADH SKN CLS APL DERMABOND .7 (GAUZE/BANDAGES/DRESSINGS) ×1
ADH SKN CLS LQ APL DERMABOND (GAUZE/BANDAGES/DRESSINGS) ×1
BLADE SURG 11 STRL SS (BLADE) ×2 IMPLANT
BLADE SURG 15 STRL LF DISP TIS (BLADE) ×1 IMPLANT
BLADE SURG 15 STRL SS (BLADE) ×2
CHLORAPREP W/TINT 10.5 ML (MISCELLANEOUS) ×2 IMPLANT
CLOTH BEACON ORANGE TIMEOUT ST (SAFETY) ×2 IMPLANT
COVER SURGICAL LIGHT HANDLE (MISCELLANEOUS) ×2 IMPLANT
DECANTER SPIKE VIAL GLASS SM (MISCELLANEOUS) ×4 IMPLANT
DERMABOND ADHESIVE PROPEN (GAUZE/BANDAGES/DRESSINGS) ×1
DERMABOND ADVANCED (GAUZE/BANDAGES/DRESSINGS) ×1
DERMABOND ADVANCED .7 DNX12 (GAUZE/BANDAGES/DRESSINGS) ×1 IMPLANT
DERMABOND ADVANCED .7 DNX6 (GAUZE/BANDAGES/DRESSINGS) IMPLANT
DRAPE PED LAPAROTOMY (DRAPES) ×2 IMPLANT
ELECT CAUTERY BLADE 6.4 (BLADE) ×2 IMPLANT
ELECT REM PT RETURN 9FT ADLT (ELECTROSURGICAL) ×2
ELECTRODE REM PT RTRN 9FT ADLT (ELECTROSURGICAL) ×1 IMPLANT
GAUZE SPONGE 4X4 16PLY XRAY LF (GAUZE/BANDAGES/DRESSINGS) ×2 IMPLANT
GLOVE BIO SURGEON STRL SZ 6 (GLOVE) ×2 IMPLANT
GLOVE BIO SURGEON STRL SZ7.5 (GLOVE) ×1 IMPLANT
GLOVE BIOGEL PI IND STRL 6.5 (GLOVE) ×1 IMPLANT
GLOVE BIOGEL PI IND STRL 7.0 (GLOVE) IMPLANT
GLOVE BIOGEL PI IND STRL 7.5 (GLOVE) IMPLANT
GLOVE BIOGEL PI INDICATOR 6.5 (GLOVE) ×1
GLOVE BIOGEL PI INDICATOR 7.0 (GLOVE) ×1
GLOVE BIOGEL PI INDICATOR 7.5 (GLOVE) ×1
GLOVE ECLIPSE 6.5 STRL STRAW (GLOVE) ×1 IMPLANT
GOWN PREVENTION PLUS XXLARGE (GOWN DISPOSABLE) ×2 IMPLANT
GOWN STRL NON-REIN LRG LVL3 (GOWN DISPOSABLE) ×3 IMPLANT
KIT BASIN OR (CUSTOM PROCEDURE TRAY) ×2 IMPLANT
KIT ROOM TURNOVER OR (KITS) ×2 IMPLANT
NDL HYPO 25GX1X1/2 BEV (NEEDLE) ×1 IMPLANT
NEEDLE HYPO 25GX1X1/2 BEV (NEEDLE) ×2 IMPLANT
NS IRRIG 1000ML POUR BTL (IV SOLUTION) ×2 IMPLANT
PACK SURGICAL SETUP 50X90 (CUSTOM PROCEDURE TRAY) ×2 IMPLANT
PAD ARMBOARD 7.5X6 YLW CONV (MISCELLANEOUS) ×4 IMPLANT
PENCIL BUTTON HOLSTER BLD 10FT (ELECTRODE) ×2 IMPLANT
SUT MON AB 4-0 PC3 18 (SUTURE) ×2 IMPLANT
SUT VIC AB 3-0 SH 27 (SUTURE) ×2
SUT VIC AB 3-0 SH 27X BRD (SUTURE) ×1 IMPLANT
SYR CONTROL 10ML LL (SYRINGE) ×2 IMPLANT
TOWEL OR 17X24 6PK STRL BLUE (TOWEL DISPOSABLE) ×2 IMPLANT
TOWEL OR 17X26 10 PK STRL BLUE (TOWEL DISPOSABLE) ×2 IMPLANT
WATER STERILE IRR 1000ML POUR (IV SOLUTION) IMPLANT

## 2011-05-13 NOTE — H&P (View-Only) (Signed)
HISTORY: Pt complaining of nausea at night during tube feeds.  She states that she gets left sided pain, then throws up.  This does not occur every night, but does happen several nights per week.  She is not having fevers/ chills.  She eats around 1 good meal/day at lunchtime, then takes 4 cans ensure at night over around 12 hours.  She cannot tolerate any higher amount of feeds.  This is interfering with her sleep.  She does not vomit up the food that she is eating.  She throws up foamy material.    PERTINENT REVIEW OF SYSTEMS: Short of breath.     EXAM: Head: Normocephalic and atraumatic.  Eyes:  Conjunctivae are normal. Pupils are equal, round, and reactive to light. No scleral icterus.  Neck:  Normal range of motion. Neck supple. No tracheal deviation present. No thyromegaly present.  Resp: No respiratory distress, normal effort. Abd:  Abdomen is soft, non distended and non tender. No masses are palpable.  There is no rebound and no guarding. J tube site is OK.  There is no sign of hernia.   Neurological: Alert and oriented to person, place, and time. Coordination normal.  Skin: Skin is warm and dry. No rash noted. No diaphoretic. No erythema. No pallor.  Psychiatric: Normal mood and affect. Normal behavior. Judgment and thought content normal.    CT scan end of November demonstrates no sign of obstruction.    ASSESSMENT AND PLAN:   Cancer of the gastroesophageal junction, pT4aN2, s/p esophagogastrectomy, J tube 11/08/2010 Nausea/vomiting at night in the middle of tube feeds.   Try cutting back to 3 cans ensure at night. Try phenergan at night before bed.   Needs to not lose weight, but doesn't have to gain weight.    Will d/c port a cath.         Jud Fanguy L Levar Fayson, MD Surgical Oncology, General & Endocrine Surgery [facility]  HUSAIN,KARRAR, MD Husain, Karrar, MD   

## 2011-05-13 NOTE — Interval H&P Note (Signed)
History and Physical Interval Note:  05/13/2011 11:18 AM  [name]  has presented today for surgery, with the diagnosis of Stomach cancer.  The various methods of treatment have been discussed with the patient and family. After consideration of risks, benefits and other options for treatment, the patient has consented to  Procedure(s): REMOVAL PORT-A-CATH as a surgical intervention .  The patients' history has been reviewed, patient examined, no change in status, stable for surgery.  I have reviewed the patients' chart and labs.  Questions were answered to the patient's satisfaction.     [name]

## 2011-05-13 NOTE — Anesthesia Preprocedure Evaluation (Signed)
Anesthesia Evaluation  Patient identified by MRN, date of birth, ID band Patient awake    Reviewed: Allergy & Precautions, H&P , NPO status , Patient's Chart, lab work & pertinent test results  Airway       Dental   Pulmonary shortness of breath and with exertion, COPD COPD inhaler,  clear to auscultation  Pulmonary exam normal       Cardiovascular neg cardio ROS Regular Normal- Systolic murmurs    Neuro/Psych Negative Neurological ROS     GI/Hepatic Neg liver ROS,   Endo/Other  Negative Endocrine ROS  Renal/GU negative Renal ROS     Musculoskeletal   Abdominal   Peds  Hematology   Anesthesia Other Findings   Reproductive/Obstetrics                           Anesthesia Physical Anesthesia Plan  ASA: III  Anesthesia Plan: MAC   Post-op Pain Management:    Induction: Intravenous  Airway Management Planned:   Additional Equipment:   Intra-op Plan:   Post-operative Plan:   Informed Consent: I have reviewed the patients History and Physical, chart, labs and discussed the procedure including the risks, benefits and alternatives for the proposed anesthesia with the patient or authorized representative who has indicated his/her understanding and acceptance.     Plan Discussed with: CRNA and Surgeon  Anesthesia Plan Comments:         Anesthesia Quick Evaluation

## 2011-05-13 NOTE — Transfer of Care (Signed)
Immediate Anesthesia Transfer of Care Note  Patient: [name]  Procedure(s) Performed:  REMOVAL PORT-A-CATH - Removal port-a-cath.  Patient Location: PACU  Anesthesia Type: MAC  Level of Consciousness: awake, alert  and oriented  Airway & Oxygen Therapy: Patient Spontanous Breathing  Post-op Assessment: Report given to PACU RN, Post -op Vital signs reviewed and stable and Patient moving all extremities X 4  Post vital signs: Reviewed and stable  Complications: No apparent anesthesia complications

## 2011-05-13 NOTE — Op Note (Signed)
  PRE-OPERATIVE DIAGNOSIS:  un-needed Port-A-Cath for gastric cancer  POST-OPERATIVE DIAGNOSIS:  Same   PROCEDURE:  Procedure(s):  REMOVAL PORT-A-CATH  SURGEON:  Surgeon(s):  Almond Lint, MD  ANESTHESIA:   General + local  EBL:   Minimal  SPECIMEN:  None  Complications : none known  Procedure:   Pt was  identified in the holding area and taken to the operating room where she was placed supine on the operating room table.  General anesthesia was induced via LMA.  The R upper chest was prepped and draped.  The prior incision was anesthetized with local anesthetic.  The incision was opened with a #15 blade.  The subcutaneous tissue was divided with the cautery.  The port was identified and the capsule opened.  The four 2-0 prolene sutures were removed.  The port was then removed and pressure held on the tract.  The catheter appeared intact without evidence of breakage.  The wound was inspected for hemostasis, which was achieved with cautery.  The wound was closed with 3-0 vicryl deep dermal interrupted sutures and 4-0 Monocryl running subcuticular suture.  The wound was cleaned, dried, and dressed with dermabond.  The patient was awakened from anesthesia and taken to the PACU in stable condition.  Needle, sponge, and instrument counts are correct.

## 2011-05-13 NOTE — Anesthesia Postprocedure Evaluation (Signed)
Anesthesia Post Note  Patient: [name]  Procedure(s) Performed:  REMOVAL PORT-A-CATH - Removal port-a-cath.  Anesthesia type: MAC  Patient location: PACU  Post pain: Pain level controlled  Post assessment: Patient's Cardiovascular Status Stable  Last Vitals:  Filed Vitals:   05/13/11 1222  BP:   Pulse: 75  Temp:   Resp: 20    Post vital signs: Reviewed and stable  Level of consciousness: sedated  Complications: No apparent anesthesia complications

## 2011-05-13 NOTE — Preoperative (Signed)
Beta Blockers   Reason not to administer Beta Blockers:Not Applicable 

## 2011-05-14 ENCOUNTER — Encounter (HOSPITAL_COMMUNITY): Payer: Self-pay | Admitting: General Surgery

## 2011-06-09 ENCOUNTER — Ambulatory Visit (INDEPENDENT_AMBULATORY_CARE_PROVIDER_SITE_OTHER): Payer: Medicare Other | Admitting: General Surgery

## 2011-06-09 ENCOUNTER — Encounter (INDEPENDENT_AMBULATORY_CARE_PROVIDER_SITE_OTHER): Payer: Self-pay | Admitting: General Surgery

## 2011-06-09 ENCOUNTER — Telehealth (INDEPENDENT_AMBULATORY_CARE_PROVIDER_SITE_OTHER): Payer: Self-pay

## 2011-06-09 VITALS — BP 128/76 | HR 111 | Temp 98.2°F | Ht 59.0 in | Wt 124.0 lb

## 2011-06-09 DIAGNOSIS — L0390 Cellulitis, unspecified: Secondary | ICD-10-CM

## 2011-06-09 DIAGNOSIS — L0291 Cutaneous abscess, unspecified: Secondary | ICD-10-CM

## 2011-06-09 MED ORDER — AMOXICILLIN-POT CLAVULANATE 875-125 MG PO TABS: 1.0000 | ORAL_TABLET | Freq: Two times a day (BID) | ORAL | Status: DC

## 2011-06-09 MED ORDER — AMOXICILLIN-POT CLAVULANATE 875-125 MG PO TABS: 1.0000 | ORAL_TABLET | Freq: Two times a day (BID) | ORAL | Status: AC

## 2011-06-09 NOTE — Telephone Encounter (Signed)
Pt called last Friday to report that the area around her J tube is very irritated and warm to touch.  She will be seen in urgent office today.

## 2011-06-09 NOTE — Progress Notes (Signed)
Subjective:     Patient ID: [name], female   DOB: [birth year], 78 y.o.   MRN: [id number]  HPI This patient presents today for evaluation of pain and redness around her jejunostomy tube. She states that this was placed approximately 6 months ago by Dr. Donell Beers for treatment of gastric cancer. She states that the tube works well but she is currently using it and she has no discomfort when it is used. She denies any fevers or chills. She just states that she has some bruising and redness around the tube site as well as some milky-appearing drainage and tenderness. No fevers or chills.  Review of Systems     Objective:   Physical Exam There is some bruising and a slight amount of erythema laterally and inferior to the to tube but I do not feel any fluctuance or induration I performed a bedside ultrasound which did not demonstrate any edema or fluid collection in the subcutaneous tissues. I do not appreciate any abscess on exam..     Assessment:     Cellulitis and tenderness around her J-tube. We placed on some antibiotics for treatment of possible cellulitis and plan for a CT scan of the abdomen to evaluate the soft tissues for possible abscess in the subcutaneous tissues. This is negative she will keep her followup appointment with Dr. Donell Beers for 11 February.    Plan:     CT scan of the abdomen Augmentin 875 b.i.d. for a week.

## 2011-06-12 ENCOUNTER — Telehealth (INDEPENDENT_AMBULATORY_CARE_PROVIDER_SITE_OTHER): Payer: Self-pay | Admitting: General Surgery

## 2011-06-12 ENCOUNTER — Ambulatory Visit
Admission: RE | Admit: 2011-06-12 | Discharge: 2011-06-12 | Disposition: A | Discharge status: Home / Self Care | Payer: Medicare Other | Source: Ambulatory Visit | Attending: General Surgery | Admitting: General Surgery

## 2011-06-12 DIAGNOSIS — L0390 Cellulitis, unspecified: Secondary | ICD-10-CM

## 2011-06-12 MED ORDER — IOHEXOL 300 MG/ML  SOLN
100.0000 mL | Freq: Once | INTRAMUSCULAR | Status: AC | PRN
  Administered 2011-06-12: 100 mL via INTRAVENOUS

## 2011-06-12 NOTE — Telephone Encounter (Signed)
[name] contacted the office concerned about the infection around her Aunts J-tube, she would like to discuss at the patients next appt the possibility of removing the tube. Pt had a CT this am and results were given to her, she understands there is not abscess and will continue with Augmentin 875 BID

## 2011-06-16 ENCOUNTER — Encounter (INDEPENDENT_AMBULATORY_CARE_PROVIDER_SITE_OTHER): Payer: Self-pay | Admitting: General Surgery

## 2011-06-16 ENCOUNTER — Ambulatory Visit (INDEPENDENT_AMBULATORY_CARE_PROVIDER_SITE_OTHER): Payer: Medicare Other | Admitting: General Surgery

## 2011-06-16 VITALS — BP 110/64 | HR 94 | Temp 98.9°F | Ht 59.0 in | Wt 121.6 lb

## 2011-06-16 DIAGNOSIS — Z85028 Personal history of other malignant neoplasm of stomach: Secondary | ICD-10-CM

## 2011-06-16 NOTE — Patient Instructions (Signed)
Continue flushing J tube with water/saline twice/day.  Eat well and stay hydrated!!  Finish antibiotics.  Follow up in 6 weeks.

## 2011-06-16 NOTE — Progress Notes (Signed)
HISTORY: Pt doing well after port a cath removal. She has had pain at the jejunostomy site.  She has been on augmentin for cellulitis around the J tube.  This is improving.  She has occasional soreness at the site.  She continues to have some drainage around tube.  She has been eating very well.  She has been gaining weight.  Her energy level is good.     EXAM: Head: Normocephalic and atraumatic.  Eyes:  Conjunctivae are normal. Pupils are equal, round, and reactive to light. No scleral icterus.  Resp: No respiratory distress, normal effort. Abd:  Abdomen is soft, non distended and non tender. No masses are palpable.  There is no rebound and no guarding. There is no erythema around tube site in LUQ, but still some induration and warmth.  There is a small amount of drainage on the gauze surrounding the J tube.   Neurological: Alert and oriented to person, place, and time. Coordination normal.  Skin: Skin is warm and dry. No rash noted. No diaphoretic. No erythema. No pallor.  Psychiatric: Normal mood and affect. Normal behavior. Judgment and thought content normal.    ASSESSMENT AND PLAN:   Cancer of the gastroesophageal junction, pT4aN2, s/p esophagogastrectomy, J tube 11/08/2010 No evidence of disease.  Continue augmentin for cellulitis around j tube.    Discontinue tube feeds.  Follow up in 6 weeks for probable J tube removal.      Maudry Diego, MD Surgical Oncology, General & Endocrine Surgery [facility]  [name], MD, MD [name], MD

## 2011-06-16 NOTE — Assessment & Plan Note (Signed)
No evidence of disease.  Continue augmentin for cellulitis around j tube.    Discontinue tube feeds.  Follow up in 6 weeks for probable J tube removal.

## 2011-07-10 ENCOUNTER — Telehealth: Payer: Self-pay | Admitting: *Deleted

## 2011-07-10 NOTE — Telephone Encounter (Signed)
Received call from pt stating that she had cxr and CT A/P done in Feb, 2013 .   Pt would like to know :  Does she need to have  Radiologic studies done again on 07/25/11.    Pt's   Phone     [phone].

## 2011-07-11 ENCOUNTER — Telehealth: Payer: Self-pay | Admitting: *Deleted

## 2011-07-11 ENCOUNTER — Other Ambulatory Visit: Payer: Self-pay | Admitting: *Deleted

## 2011-07-11 DIAGNOSIS — C169 Malignant neoplasm of stomach, unspecified: Secondary | ICD-10-CM

## 2011-07-11 NOTE — Telephone Encounter (Signed)
Called niece  Norma Fredrickson on cell phone and left message on voice mail for her to call office  Re:   Pt only needs  CT chest only  As per Dr. Dalene Carrow.  [name]'s   Phone      [phone].

## 2011-07-14 ENCOUNTER — Other Ambulatory Visit: Payer: Self-pay | Admitting: *Deleted

## 2011-07-18 ENCOUNTER — Other Ambulatory Visit (HOSPITAL_COMMUNITY): Payer: Self-pay | Admitting: Radiology

## 2011-07-18 ENCOUNTER — Telehealth: Payer: Self-pay | Admitting: Hematology and Oncology

## 2011-07-18 DIAGNOSIS — J449 Chronic obstructive pulmonary disease, unspecified: Secondary | ICD-10-CM

## 2011-07-18 NOTE — Telephone Encounter (Signed)
Per 3/8 pof please schedule ct chest only and cx ct abd/pelvis. Then per hand written note from thu pt has other appts 3/22 so r/s ct to any day but 3/22 it just needs to be before 3/27. Also per carrie in [place] pt has cxr w/ct. Per LO cxr not needed ct-chest only. S/w pt today re changes w/new d/t for lb 3/20 @ 9:30 am and ct @ [facility] 13/20 @ 10:30 am. Per aware that she cannot eat/drink 4hr prior to test and do not drink prep.

## 2011-07-23 ENCOUNTER — Ambulatory Visit (HOSPITAL_COMMUNITY)
Admission: RE | Admit: 2011-07-23 | Discharge: 2011-07-23 | Disposition: A | Discharge status: Home / Self Care | Payer: Medicare Other | Source: Ambulatory Visit | Attending: Hematology and Oncology | Admitting: Hematology and Oncology

## 2011-07-23 ENCOUNTER — Other Ambulatory Visit (HOSPITAL_BASED_OUTPATIENT_CLINIC_OR_DEPARTMENT_OTHER): Payer: Medicare Other | Admitting: Lab

## 2011-07-23 DIAGNOSIS — I2510 Atherosclerotic heart disease of native coronary artery without angina pectoris: Secondary | ICD-10-CM | POA: Insufficient documentation

## 2011-07-23 DIAGNOSIS — J438 Other emphysema: Secondary | ICD-10-CM | POA: Insufficient documentation

## 2011-07-23 DIAGNOSIS — C169 Malignant neoplasm of stomach, unspecified: Secondary | ICD-10-CM | POA: Insufficient documentation

## 2011-07-23 LAB — CMP (CANCER CENTER ONLY)
ALT(SGPT): 22 U/L (ref 10–47)
AST: 31 U/L (ref 11–38)
Albumin: 3.6 g/dL (ref 3.3–5.5)
Alkaline Phosphatase: 109 U/L — ABNORMAL HIGH (ref 26–84)
BUN, Bld: 7 mg/dL (ref 7–22)
CO2: 23 mEq/L (ref 18–33)
Calcium: 8.9 mg/dL (ref 8.0–10.3)
Chloride: 104 mEq/L (ref 98–108)
Creat: 0.8 mg/dl (ref 0.6–1.2)
Glucose, Bld: 83 mg/dL (ref 73–118)
Potassium: 3.8 mEq/L (ref 3.3–4.7)
Sodium: 143 mEq/L (ref 128–145)
Total Bilirubin: 0.6 mg/dl (ref 0.20–1.60)
Total Protein: 7.1 g/dL (ref 6.4–8.1)

## 2011-07-23 LAB — CEA: CEA: 1.2 ng/mL (ref 0.0–5.0)

## 2011-07-23 MED ORDER — IOHEXOL 300 MG/ML  SOLN
80.0000 mL | Freq: Once | INTRAMUSCULAR | Status: AC | PRN
  Administered 2011-07-23: 80 mL via INTRAVENOUS

## 2011-07-24 ENCOUNTER — Ambulatory Visit (HOSPITAL_COMMUNITY)
Admission: RE | Admit: 2011-07-24 | Discharge: 2011-07-24 | Disposition: A | Discharge status: Home / Self Care | Payer: Medicare Other | Source: Ambulatory Visit | Attending: Internal Medicine | Admitting: Internal Medicine

## 2011-07-24 DIAGNOSIS — J449 Chronic obstructive pulmonary disease, unspecified: Secondary | ICD-10-CM | POA: Insufficient documentation

## 2011-07-24 DIAGNOSIS — J4489 Other specified chronic obstructive pulmonary disease: Secondary | ICD-10-CM | POA: Insufficient documentation

## 2011-07-24 LAB — PULMONARY FUNCTION TEST

## 2011-07-25 ENCOUNTER — Other Ambulatory Visit (HOSPITAL_COMMUNITY): Payer: Medicare Other

## 2011-07-25 ENCOUNTER — Other Ambulatory Visit: Payer: Medicare Other | Admitting: Lab

## 2011-07-25 ENCOUNTER — Ambulatory Visit (INDEPENDENT_AMBULATORY_CARE_PROVIDER_SITE_OTHER): Payer: Medicare Other | Admitting: General Surgery

## 2011-07-25 ENCOUNTER — Encounter (INDEPENDENT_AMBULATORY_CARE_PROVIDER_SITE_OTHER): Payer: Self-pay | Admitting: General Surgery

## 2011-07-25 VITALS — BP 134/83 | HR 83 | Temp 97.6°F | Ht 59.0 in | Wt 118.0 lb

## 2011-07-25 DIAGNOSIS — C160 Malignant neoplasm of cardia: Secondary | ICD-10-CM

## 2011-07-25 NOTE — Patient Instructions (Signed)
Expect drainage from J tube site for several days to a week.  If still having significant drainage after a week, give [place] a call.  See me back in 3 months.

## 2011-07-26 NOTE — Progress Notes (Signed)
HISTORY: Patient is status post removal of Port-A-Cath. She's had no pain in this site. Since that time and I saw her several weeks ago to take out her Port-A-Cath she has maintained her weight. She continues to eat multiple small meals a day. She has no complaints other than some drainage around her J-tube site. She has not used her J-tube in 3 months.  PERTINENT REVIEW OF SYSTEMS: Otherwise negative.     EXAM: Head: Normocephalic and atraumatic.  Eyes:  Conjunctivae are normal. Pupils are equal, round, and reactive to light. No scleral icterus.  Neck:  Normal range of motion. Neck supple. No tracheal deviation present. No thyromegaly present.  Resp: No respiratory distress, normal effort.  Port removal site intact without swelling. Abd:  Abdomen is soft, non distended and non tender. No masses are palpable.  There is no rebound and no guarding. J tube is removed.   Neurological: Alert and oriented to person, place, and time. Coordination normal.  Skin: Skin is warm and dry. No rash noted. No diaphoretic. No erythema. No pallor.  Psychiatric: Normal mood and affect. Normal behavior. Judgment and thought content normal.      ASSESSMENT AND PLAN:   Gastroesophageal cancer Patient is now 9 months status post esophagogastrectomy.  Given the fact that she has not been losing weight, I will discontinue her jejunostomy tube. The sutures was clipped and this was removed. This was dressed with dressing.  She is doing well after Port-A-Cath and has no residual disease. She is scheduled to followup with Dr. Dalene Carrow.  I will defer follow up scans to her.         Maudry Diego, MD Surgical Oncology, General & Endocrine Surgery [facility]  [name], MD, MD [name], MD

## 2011-07-26 NOTE — Assessment & Plan Note (Signed)
Patient is now 9 months status post esophagogastrectomy.  Given the fact that she has not been losing weight, I will discontinue her jejunostomy tube. The sutures was clipped and this was removed. This was dressed with dressing.  She is doing well after Port-A-Cath and has no residual disease. She is scheduled to followup with Dr. Dalene Carrow.  I will defer follow up scans to her.

## 2011-07-30 ENCOUNTER — Ambulatory Visit (HOSPITAL_BASED_OUTPATIENT_CLINIC_OR_DEPARTMENT_OTHER): Payer: Medicare Other | Admitting: Hematology and Oncology

## 2011-07-30 ENCOUNTER — Ambulatory Visit: Payer: Medicare Other | Admitting: Lab

## 2011-07-30 ENCOUNTER — Telehealth: Payer: Self-pay | Admitting: Hematology and Oncology

## 2011-07-30 ENCOUNTER — Encounter: Payer: Self-pay | Admitting: Hematology and Oncology

## 2011-07-30 VITALS — BP 142/84 | HR 82 | Temp 98.8°F | Ht 59.0 in | Wt 118.0 lb

## 2011-07-30 DIAGNOSIS — J449 Chronic obstructive pulmonary disease, unspecified: Secondary | ICD-10-CM | POA: Insufficient documentation

## 2011-07-30 DIAGNOSIS — I10 Essential (primary) hypertension: Secondary | ICD-10-CM | POA: Insufficient documentation

## 2011-07-30 DIAGNOSIS — C169 Malignant neoplasm of stomach, unspecified: Secondary | ICD-10-CM

## 2011-07-30 DIAGNOSIS — D649 Anemia, unspecified: Secondary | ICD-10-CM | POA: Insufficient documentation

## 2011-07-30 LAB — CBC WITH DIFFERENTIAL/PLATELET
Basophils Absolute: 0 10*3/uL (ref 0.0–0.1)
Eosinophils Absolute: 0.2 10*3/uL (ref 0.0–0.5)
HCT: 37.7 % (ref 34.8–46.6)
HGB: 12.2 g/dL (ref 11.6–15.9)
LYMPH%: 40.4 % (ref 14.0–49.7)
MONO#: 0.7 10*3/uL (ref 0.1–0.9)
NEUT#: 2.1 10*3/uL (ref 1.5–6.5)
Platelets: 194 10*3/uL (ref 145–400)
RBC: 4.59 10*6/uL (ref 3.70–5.45)
WBC: 5.1 10*3/uL (ref 3.9–10.3)
nRBC: 0 % (ref 0–0)

## 2011-07-30 NOTE — Telephone Encounter (Signed)
gv pt appt for pet scan on 04/08 @ [facility]. sent pt to lab

## 2011-07-30 NOTE — Patient Instructions (Signed)
Patient to follow up as instructed.   Current Outpatient Prescriptions  Medication Sig Dispense Refill  . albuterol (PROVENTIL HFA;VENTOLIN HFA) 108 (90 BASE) MCG/ACT inhaler Inhale 3 puffs into the lungs every 4 (four) hours as needed. For shortness of breath       . calcium-vitamin D (OSCAL WITH D) 500-200 MG-UNIT per tablet Take 1 tablet by mouth every other day.       . Cyanocobalamin (B-12) 100 MCG TABS Take 1 tablet by mouth as needed.       . Ferrous Sulfate (IRON) 325 (65 FE) MG TABS Take 1 tablet by mouth daily.       . fish oil-omega-3 fatty acids 1000 MG capsule Take 1 g by mouth daily.       . hydrOXYzine (ATARAX) 10 MG tablet Take 10 mg by mouth daily as needed. For itching.      . lovastatin (MEVACOR) 20 MG tablet Take 20 mg by mouth daily.        . Multiple Vitamins-Minerals (CENTRUM SILVER PO) Take 1 tablet by mouth daily.       . Multiple Vitamins-Minerals (ICAPS PO) Take 1 tablet by mouth every other day.       . oxyCODONE-acetaminophen (ROXICET) 5-325 MG per tablet Take 1 tablet by mouth every 4 (four) hours as needed for pain.  20 tablet  0        March 2013  Sunday Monday Tuesday Wednesday Thursday Friday Saturday                  1   2    3   4   5   6   7   8   9    10   11   12   13   14   15   16    17   18   19   20    LAB MO     9:30 AM  (15 min.)  Beverely Pace [place]  [facility]   CT BODY W/  10:30 AM  (30 min.)  [facility]-Ct 1  [facility]-CT IMAGING 21   FULL PFT  10:30 AM  (60 min.)  [facility] [facility]  [facility] RESPIRATORY THERAPY 22   POST OP/RECHECK  11:00 AM  (15 min.)  Almond Lint, MD  [facility] 23    24   25   26   27    EST PT 30  11:30 AM  (30 min.)  Laurice Record, MD  [facility] MEDICAL ONCOLOGY 28   29   30     31

## 2011-07-30 NOTE — Progress Notes (Signed)
CC:   [name], MD Radene Gunning, M.D., Ph.D. Almond Lint, MD  IDENTIFYING STATEMENT:  The patient is a 78 year old woman with gastric cancer, who presents discuss scans.  INTERVAL HISTORY:  Ms. Knudtson reports fair energy levels.  She is eating.  She has no problems with swallowing.  Her bowels are at baseline.  She had a port removed on 07/25/2011.  Her G-tube has also been removed.  She is maintaining her weight.  She is not short of breath.  She denies hemoptysis.  She also denies chest pain.  We reviewed CT of the chest, abdomen, pelvis of 07/23/2011.  There was a new 2.8 x 1.9 cm mass in the apex of the right upper lobe abutting the mediastinal pleura without mediastinal invasion.  No additional suspicious-appearing nodules or masses were noted.  There were also some new opacities in the medial aspect of the lung bases bilaterally, felt to favor areas of scarring.  The abdomen showed postsurgical changes from patient's prior partial gastrectomy.  There was no other evidence other evidence of disease to suggest metastatic disease in the abdomen or pelvis.  MEDICATIONS:  Reviewed and updated.  ALLERGIES:  None.  Past medical history, family history and social history unchanged.  REVIEW OF SYSTEMS:  As above and negative.  PHYSICAL EXAMINATION:  The patient is a well-appearing woman in no distress.  Vitals:  Pulse 82, blood pressure 142/84, temperature 98.8, respirations 18, weight 118 pounds.  HEENT:  Head is atraumatic, normocephalic.  Sclerae are anicteric.  Mouth is moist.  Chest:  Clear. Cardiovascular:  Unremarkable.  Abdomen:  Soft, nontender.  Bowel sounds present.  Extremities:  No calf tenderness.  Lymph nodes:  No adenopathy.  CNS:  Nonfocal.  LABORATORY DATA:  07/23/2011:  Sodium 143, potassium 3.8, chloride 104, CO2 23, creatinine 0.8, BUN 7, total bilirubin 0.6, alkaline phosphatase 109, AST 31, ALT 22, total protein 7.1.  CEA 1.2.  CBC pending.   Results of CT as in interval history.  IMPRESSION/PLAN:  Ms. 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  Four of 5 lymph nodes sampled had evidence of malignancy.  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  She completed Taxol and carboplatin given weekly with radiation therapy from 01/13/2011 through 02/10/2011.  Her current staging workup has indicated a 3 cm solitary nodule in the right upper lobe.  She has no other evidence of disease.  I recommend she follows up with a PET scan.  If the PET scan demonstrates hypermetabolic activity, this needs to be either biopsied or excised, and thus a potential referral will be made to cardiothoracic surgery.  The patient voiced her understanding and we will telephone her  With the results.  We also discussed the findings with patient's niece,  Beatriz Chancellor.    ______________________________ Laurice Record, M.D. LIO/MEDQ  D:  07/30/2011  T:  07/30/2011  Job:  [id number]

## 2011-07-30 NOTE — Progress Notes (Signed)
This office note has been dictated.

## 2011-08-11 ENCOUNTER — Encounter (HOSPITAL_COMMUNITY)
Admission: RE | Admit: 2011-08-11 | Discharge: 2011-08-11 | Disposition: A | Discharge status: Home / Self Care | Payer: Medicare Other | Source: Ambulatory Visit | Attending: Hematology and Oncology | Admitting: Hematology and Oncology

## 2011-08-11 ENCOUNTER — Telehealth: Payer: Self-pay | Admitting: *Deleted

## 2011-08-11 DIAGNOSIS — D649 Anemia, unspecified: Secondary | ICD-10-CM

## 2011-08-11 DIAGNOSIS — I517 Cardiomegaly: Secondary | ICD-10-CM | POA: Insufficient documentation

## 2011-08-11 DIAGNOSIS — R188 Other ascites: Secondary | ICD-10-CM | POA: Insufficient documentation

## 2011-08-11 DIAGNOSIS — C169 Malignant neoplasm of stomach, unspecified: Secondary | ICD-10-CM | POA: Insufficient documentation

## 2011-08-11 DIAGNOSIS — I700 Atherosclerosis of aorta: Secondary | ICD-10-CM | POA: Insufficient documentation

## 2011-08-11 DIAGNOSIS — R222 Localized swelling, mass and lump, trunk: Secondary | ICD-10-CM | POA: Insufficient documentation

## 2011-08-11 DIAGNOSIS — I2510 Atherosclerotic heart disease of native coronary artery without angina pectoris: Secondary | ICD-10-CM | POA: Insufficient documentation

## 2011-08-11 DIAGNOSIS — K7689 Other specified diseases of liver: Secondary | ICD-10-CM | POA: Insufficient documentation

## 2011-08-11 DIAGNOSIS — J90 Pleural effusion, not elsewhere classified: Secondary | ICD-10-CM | POA: Insufficient documentation

## 2011-08-11 MED ORDER — FLUDEOXYGLUCOSE F - 18 (FDG) INJECTION
19.3000 | Freq: Once | INTRAVENOUS | Status: AC | PRN
  Administered 2011-08-11: 19.3 via INTRAVENOUS

## 2011-08-11 NOTE — Telephone Encounter (Signed)
RECEIVED FAX REPORT. THIS REPORT AND DR.ODOGWU'S 07/30/11 DICTATION WAS SHOWN TO DR.MURINSON. VERBAL ORDER AND READ BACK TO DR.MURINSON- THIS REPORT TO BE ADDRESSED BY DR.ODOGWU WHEN SHE RETURNS TOMORROW. THIS REPORT PLACED IN DR.ODOGWU'S ACTIVE WORK BOX.

## 2011-08-12 ENCOUNTER — Other Ambulatory Visit: Payer: Self-pay | Admitting: Hematology and Oncology

## 2011-08-12 ENCOUNTER — Telehealth: Payer: Self-pay | Admitting: *Deleted

## 2011-08-12 DIAGNOSIS — C169 Malignant neoplasm of stomach, unspecified: Secondary | ICD-10-CM

## 2011-08-12 NOTE — Telephone Encounter (Signed)
Spoke with niece Norma Fredrickson and informed her re:  Per Dr. Dalene Carrow,  Pt has new mass in lung from PET scan results done 08/11/11.  Pt will be referred to Cardiothoracic surgeon for further evaluation.   Annabelle Harman stated she would inform pt of finding.   Informed Annabelle Harman that a referral will be made to [facility],  And  A scheduler will contact her with date and time for appt with the surgeon.   Annabelle Harman voiced understanding. [name]'s   Cell  Phone     [phone].

## 2011-08-12 NOTE — Telephone Encounter (Signed)
Attempted to call niece  Norma Fredrickson on both home and  Cell phone .   Left message on both voice mails for Annabelle Harman to call nurse back for further instructions on pt.

## 2011-08-13 ENCOUNTER — Telehealth: Payer: Self-pay | Admitting: *Deleted

## 2011-08-13 NOTE — Telephone Encounter (Signed)
Spoke with Dawn @ [facility] and was informed re:  Pt has appt with Dr. Tyrone Sage on  Fri. 08/15/11  At  1130 am.   Spoke with niece Norma Fredrickson and gave her date and time for appt with the surgeon.   Enid Cutter phone number to [facility] @ [facility] to call for further questions.   Annabelle Harman stated she would inform pt of appt , and will accompany pt to the appt.

## 2011-08-15 ENCOUNTER — Encounter: Payer: Self-pay | Admitting: Cardiothoracic Surgery

## 2011-08-15 ENCOUNTER — Institutional Professional Consult (permissible substitution) (INDEPENDENT_AMBULATORY_CARE_PROVIDER_SITE_OTHER): Payer: Medicare Other | Admitting: Cardiothoracic Surgery

## 2011-08-15 VITALS — BP 150/92 | HR 88 | Resp 16 | Ht 59.0 in | Wt 118.0 lb

## 2011-08-15 DIAGNOSIS — R918 Other nonspecific abnormal finding of lung field: Secondary | ICD-10-CM

## 2011-08-15 DIAGNOSIS — D381 Neoplasm of uncertain behavior of trachea, bronchus and lung: Secondary | ICD-10-CM

## 2011-08-24 ENCOUNTER — Encounter: Payer: Self-pay | Admitting: Cardiothoracic Surgery

## 2011-08-24 DIAGNOSIS — R918 Other nonspecific abnormal finding of lung field: Secondary | ICD-10-CM | POA: Insufficient documentation

## 2011-08-24 NOTE — Progress Notes (Signed)
[address]            [place] [address]          [phone]      [name] [facility] Medical Record #[id number] Date of Birth: 11/17/1930  Referring: Laurice Record, MD Primary Care: [name], MD, MD  Chief Complaint:    Chief Complaint  Patient presents with  . 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  Her wt has been stable and porta cath and feeding tube were recently removed. Follow up scans including PET show rt upper lung mass, hypermetabloic. Patient is former smoker, quit in 1990      Current Activity/ Functional Status: Patient is independent with mobility/ambulation, transfers, ADL's, IADL's.   Past Medical History  Diagnosis Date  . 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 Dyslipidemia   . Arthritis     Hx of osteoarthritis  . Shortness of breath     WITH EXERTION   . COPD (chronic obstructive pulmonary disease)   . Anemia     Past Surgical History  Procedure Date  . Esophagogastrectomy, j tube 11/08/2010   . Cholecystectomy   . Abdominal hysterectomy   . Cholecystectomy   . Cataract extraction     RIGHT EYE   . Port-a-cath removal 05/13/2011    Procedure: REMOVAL PORT-A-CATH;  Surgeon: Almond Lint, MD;  Location: [facility] OR;  Service: General;  Laterality: N/A;  Removal port-a-cath.    Family History  Problem Relation Age of Onset  . Cancer Sister     unknown    History   Social History  . Marital Status: Widowed         Social History Main Topics  . Smoking status: Former Smoker -- 0.2 packs/day    Types: Cigarettes    Quit date: 05/16/1988  . Smokeless tobacco: Never Used  . Alcohol Use: No  . Drug Use: No  . Sexually Active: Not on file      History  Smoking status  . Former Smoker -- 0.2 packs/day  . Types:  Cigarettes  . Quit date: 05/16/1988  Smokeless tobacco  . Never Used    History  Alcohol Use No     No Known Allergies  Current Outpatient Prescriptions  Medication Sig Dispense Refill  . albuterol (PROVENTIL HFA;VENTOLIN HFA) 108 (90 BASE) MCG/ACT inhaler Inhale 3 puffs into the lungs every 4 (four) hours as needed. For shortness of breath       . calcium-vitamin D (OSCAL WITH D) 500-200 MG-UNIT per tablet Take 1 tablet by mouth every other day.       . Cyanocobalamin (B-12) 100 MCG TABS Take 1 tablet by mouth as needed.       . Ferrous Sulfate (IRON) 325 (65 FE) MG TABS Take 1 tablet by mouth daily.       . fish oil-omega-3 fatty acids 1000 MG capsule Take 1 g by mouth daily.       . hydrOXYzine (ATARAX) 10 MG tablet Take 10 mg by mouth daily as needed. For itching.      . lovastatin (MEVACOR) 20 MG tablet Take 20 mg by mouth daily.        Marland Kitchen  Multiple Vitamins-Minerals (CENTRUM SILVER PO) Take 1 tablet by mouth daily.       . Multiple Vitamins-Minerals (ICAPS PO) Take 1 tablet by mouth every other day.            Review of Systems:     Cardiac Review of Systems: Y or N  Chest Pain [  n  ]  Resting SOB [ y  ] Exertional SOB  Cove.Etienne  ]  Orthopnea [  ]   Pedal Edema [   ]    Palpitations [  ] Syncope  [  ]   Presyncope [   ]  General Review of Systems: [Y] = yes [  ]=no Constitional: recent weight change [ n ]; anorexia [n  ]; fatigue Cove.Etienne  ]; nausea [n  ]; night sweats [n  ]; fever [ n ]; or chills [n  ];                                                                                                                                          Dental: poor dentition[  ];   Eye : blurred vision [  ]; diplopia [   ]; vision changes [ n ];  Amaurosis fugax[  ]; Resp: cough [  ];  wheezing[  ];  hemoptysis[  ]; shortness of breath[ y ]; paroxysmal nocturnal dyspnea[n  ]; dyspnea on exertion[y  ]; or orthopnea[  ];  GI:  gallstones[  ], vomiting[  ];  dysphagia[  ]; melena[ n ];  hematochezia  [n  ]; heartburn[  ];   Hx of  Colonoscopy[  ]; GU: kidney stones [  ]; hematuria[  ];   dysuria [  ];  nocturia[  ];  history of     obstruction [  ];             Skin: rash, swelling[  ];, hair loss[  ];  peripheral edema[  ];  or itching[  ]; Musculosketetal: myalgias[  ];  joint swelling[  ];  joint erythema[  ];  joint pain[  ];  back pain[  ];  Heme/Lymph: bruising[  ];  bleeding[  ];  anemia[  ];  Neuro: TIA[  ];  headaches[  ];  stroke[ n ];  vertigo[  ];  seizures[  ];   paresthesias[  ];  difficulty walking[  ];  Psych:depression[  ]; anxiety[  ];  Endocrine: diabetes[  ];  thyroid dysfunction[  ];  Immunizations: Flu [  ]; Pneumococcal[  ];  Other:  Physical Exam: BP 150/92  Pulse 88  Resp 16  Ht 4\' 11"  (1.499 m)  Wt 118 lb (53.524 kg)  BMI 23.83 kg/m2  SpO2 92%  General appearance: alert, cooperative, appears older than stated age, cachectic and no distress Neurologic: intact Heart: regular rate and rhythm, S1, S2 normal, no murmur, click, rub or gallop,  normal apical impulse and no rub Lungs: clear to auscultation bilaterally and normal percussion bilaterally Abdomen: soft, non-tender; bowel sounds normal; no masses,  no organomegaly and healed incision Extremities: extremities normal, atraumatic, no cyanosis or edema, Homans sign is negative, no sign of DVT, no ulcers, gangrene or trophic changes and palpable pedal pulses no carotid briuts No palpable adenopathy, cervical or supraclavicular fossa or axillary   Diagnostic Studies & Laboratory data:    Nm Pet Image Initial (pi) Skull Base To Thigh  08/11/2011  *RADIOLOGY REPORT*  Clinical Data: Subsequent treatment strategy for gastric cancer.  NUCLEAR MEDICINE PET SKULL BASE TO THIGH  Fasting Blood Glucose:  85  Technique:  19.3 mCi F-18 FDG was injected intravenously. CT data was obtained and used for attenuation correction and anatomic localization only.  (This was not acquired as a diagnostic CT examination.)  Additional exam technical data entered on technologist worksheet.  Comparison:  Multiple priors, most recently PET CT dated 12/20/2010, and chest CT dated 07/23/2011.  Findings:  Neck: No hypermetabolic lymph nodes in the neck.  There is some symmetrically increased activity in the region of the tonsillar pillars bilaterally, favored be reactive as there is no soft tissue abnormality noted on the CT portion of the examination.  Chest: There has been some interval growth of what is now a 3.2 x 2.1 cm pleural based mass in the medial aspect of the right upper lobe apex which is hypermetabolic (SUVmax = 6.9).  Linear opacities in the medial segment of the right middle lobe and the inferior segment of the lingula likely reflect areas of atelectasis and/or scarring.  No additional definite suspicious-appearing pulmonary nodules or masses are otherwise noted.  There is a new small left- sided pleural effusion. Heart size is mildly enlarged. There is no significant pericardial fluid, thickening or pericardial calcification. There is atherosclerosis of the thoracic aorta, the great vessels of the mediastinum and the coronary arteries, including calcified atherosclerotic plaque in the left main, left anterior descending, left circumflex and right coronary arteries. No pathologically enlarged or hypermetabolic mediastinal or hilar lymph nodes.    A nonenlarged (8 mm short axis) left axillary lymph node is noted that demonstrates some low level increased metabolic activity (SUVmax = 2.3), which is similar in retrospect compared to prior PET CT 12/20/2010, and highly nonspecific, presumably reactive.  Abdomen/Pelvis:  No abnormal hypermetabolic activity within the liver, pancreas, adrenal glands, or spleen.  No hypermetabolic lymph nodes in the abdomen or pelvis.  Postoperative changes of partial gastrectomy near the GE junction are noted.  Status post cholecystectomy.  Soft tissue in the region of the pancreaticoduodenal  groove is indistinct, and there is stranding throughout the peripancreatic retroperitoneal soft tissues.  Mild bilateral perinephric stranding is also noted.  Low attenuation is noted throughout the second and third segments of the liver, likely to represent focal fatty infiltration atherosclerosis of the abdominal and pelvic vasculature, without focal aneurysm.  Small volume of ascites layering within the cul-de-sac.  Status post hysterectomy. Previously noted jejunostomy tube has been removed and there is a healed tract in the anterior abdominal wall overlying left upper quadrant of the abdomen.  [place]:  No focal hypermetabolic activity to suggest skeletal metastasis.  IMPRESSION: 1.  3.2 x 2.1 cm pleural based hypermetabolic mass in the medial aspect of the right lung apex is consistent with malignancy. Whether not this represents a solitary pulmonary metastasis (this would be an unusual location for metastatic lesion), or a primary lung lesion is  uncertain. 2.  Nonenlarged 8 mm left axillary lymph node with low-level increased metabolic activity, similar to prior study.  This finding is nonspecific and presumably reactive, however, continued attention on follow-up studies is recommended. 3.  Poor definition of soft tissues in the pancreaticoduodenal groove with stranding throughout the peripancreatic fat of the retroperitoneum.  Findings are nonspecific, but can be seen in the setting of acute pancreatitis.  Clinical correlation is recommended. 3.  There is also a small volume of ascites layering within the cul- de-sac. 4.  Focal fatty infiltration in segments two and three in the liver. 5. Atherosclerosis, including left main and three-vessel coronary artery disease. Please note that although the presence of coronary artery calcium documents the presence of coronary artery disease, the severity of this disease and any potential stenosis cannot be assessed on this non-gated CT examination.  Assessment for  potential risk factor modification, dietary therapy or pharmacologic therapy may be warranted, if clinically indicated. 6.  Additional postoperative changes, as above.  These results will be called to the ordering clinician or representative by the Radiologist Assistant, and communication documented in the PACS Dashboard.  Original Report Authenticated By: Florencia Reasons, M.D.    CT CHEST WITH CONTRAST   Technique:  Multidetector CT imaging of the chest was performed following the standard protocol during bolus administration of intravenous contrast.   Contrast: 80mL OMNIPAQUE IOHEXOL 300 MG/ML IJ SOLN   Comparison: CT of the abdomen and pelvis 06/12/2011.  CT of chest 05/30/2010.   Findings:   Mediastinum: Heart size is normal. There is no significant pericardial fluid, thickening or pericardial calcification.   The esophagus is patulous and fluid-filled. No pathologically enlarged mediastinal or hilar lymph nodes.   There is atherosclerosis of the thoracic aorta, the great vessels of the mediastinum and the coronary arteries, including calcified atherosclerotic plaque in the left anterior descending, left circumflex and right coronary arteries.   Lungs/Pleura: Within the medial aspect of the apex of the right upper lobe there is a new 2.8 x 1.9 cm mass which abuts the mediastinal pleura, without definitive evidence to suggest mediastinal invasion at this time.  No additional suspicious appearing pulmonary nodules or masses are otherwise identified. However, there are some new opacities in the medial aspects of the lung bases bilaterally which are favored to represent areas of scarring (however, some neoplasms such as gastric adenocarcinoma can have a very infiltrative and ill-defined appearance with their metastases).  There is a background of moderate centrilobular emphysema.  Some faint subpleural reticulation is scattered throughout the periphery of the lungs bilaterally,  similar compared to the prior examination, without frank honeycombing or traction bronchiectasis to strongly suggest the presence of an underlying interstitial lung disease.  No pleural effusions.   Upper Abdomen: Postsurgical changes likely from prior partial gastrectomy are noted.  A surgical clip is noted in the gallbladder fossa consistent with prior history of cholecystectomy.   Musculoskeletal: There are no aggressive appearing lytic or blastic lesions noted in the visualized portions of the skeleton.   IMPRESSION: 1.  Interval development of a 2.8 x 1.9 cm macrolobulated nodule in the medial aspect of the right upper lobe that abuts the pleura overlying the mediastinum (without definitive evidence of mediastinal invasion at this time).  This finding is concerning for potential solitary metastasis or primary bronchogenic neoplasm, although this may less likely represent a focus of inflammation/infection.  No other definite evidence of metastatic disease is otherwise noted. 2. Atherosclerosis, including three-vessel coronary artery  disease. Please note that although the presence of coronary artery calcium documents the presence of coronary artery disease, the severity of this disease and any potential stenosis cannot be assessed on this non-gated CT examination.  Assessment for potential risk factor modification, dietary therapy or pharmacologic therapy may be warranted, if clinically indicated. 3. Background of moderate centrilobular emphysema again noted.   These results were called by telephone on 07/23/2011  at  02:38 p.m. to  Dr. Dalene Carrow, who verbally acknowledged these results.   Original Report Authenticated By: Florencia Reasons, M.D.       Recent Lab Findings: Lab Results  Component Value Date   WBC 5.1 07/30/2011   HGB 12.2 07/30/2011   HCT 37.7 07/30/2011   PLT 194 07/30/2011   GLUCOSE 83 07/23/2011   ALT 22 03/31/2011   AST 31 07/23/2011   NA 143 07/23/2011    K 3.8 07/23/2011   CL 104 07/23/2011   CREATININE 0.8 07/23/2011   BUN 7 07/23/2011   CO2 23 07/23/2011      Assessment / Plan:   1,New enlarging rt upper lobe lung lesion in patient with recently resected pT4N2 adeno CA of cardia, unclear if MET or new primary lung CA 2, COPD 3, No history of Coronary Disease but CT evidence of calcified left main.  I have reviewed the films and discussed with the patient the findings,she has limited pulmonary reserve by history. Ideally bx of the lesion in rt upper lobe to determine if it is primary lung vs gastric met. I will obtain the original ct disk dicom data and review if ENB (endobronchial navigation) can be used to obtain tissue DX. The location of the mass and degree of pulmonary disease will make it difficult to do percutaneous needle BX. She will come back next week to review options.           [name] MD  Beeper [phone] Office [phone] 08/24/2011 11:18 AM

## 2011-08-25 ENCOUNTER — Encounter: Payer: Self-pay | Admitting: Cardiothoracic Surgery

## 2011-08-25 ENCOUNTER — Ambulatory Visit (INDEPENDENT_AMBULATORY_CARE_PROVIDER_SITE_OTHER): Payer: Medicare Other | Admitting: Cardiothoracic Surgery

## 2011-08-25 VITALS — BP 140/85 | HR 88 | Resp 20 | Ht 59.0 in | Wt 115.0 lb

## 2011-08-25 DIAGNOSIS — R222 Localized swelling, mass and lump, trunk: Secondary | ICD-10-CM

## 2011-08-25 NOTE — Progress Notes (Deleted)
[address]            [place] [address]          [phone]       [name] [facility] Medical Record #[id number] Date of Birth: Nov 07, 1930  Referring: Laurice Record, MD Primary Care: [name], MD, MD  Chief Complaint:    Chief Complaint  Patient presents with  . Lung Mass    Further discuss surgery    History of Present Illness:        Current Activity/ Functional Status: {functional status:19517}   Past Medical History  Diagnosis Date  . 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 Dyslipidemia   . Arthritis     Hx of osteoarthritis  . Shortness of breath     WITH EXERTION   . COPD (chronic obstructive pulmonary disease)   . Anemia     Past Surgical History  Procedure Date  . Esophagogastrectomy, j tube 11/08/2010   . Cholecystectomy   . Abdominal hysterectomy   . Cholecystectomy   . Cataract extraction     RIGHT EYE   . Port-a-cath removal 05/13/2011    Procedure: REMOVAL PORT-A-CATH;  Surgeon: Almond Lint, MD;  Location: [facility] OR;  Service: General;  Laterality: N/A;  Removal port-a-cath.    History  Smoking status  . Former Smoker -- 0.2 packs/day  . Types: Cigarettes  . Quit date: 05/16/1988  Smokeless tobacco  . Never Used   History  Alcohol Use No    History   Social History  . Marital Status: Widowed    Spouse Name: N/A    Number of Children: N/A  . Years of Education: N/A   Occupational History  . Not on file.   Social History Main Topics  . Smoking status: Former Smoker -- 0.2 packs/day    Types: Cigarettes    Quit date: 05/16/1988  . Smokeless tobacco: Never Used  . Alcohol Use: No  . Drug Use: No  . Sexually Active: Not on file   Other Topics Concern  . Not on file   Social History Narrative  . No narrative on file    No Known Allergies  Current Outpatient Prescriptions  Medication Sig Dispense Refill  . albuterol (PROVENTIL HFA;VENTOLIN HFA) 108 (90  BASE) MCG/ACT inhaler Inhale 3 puffs into the lungs every 4 (four) hours as needed. For shortness of breath       . calcium-vitamin D (OSCAL WITH D) 500-200 MG-UNIT per tablet Take 1 tablet by mouth every other day.       . Cyanocobalamin (B-12) 100 MCG TABS Take 1 tablet by mouth as needed.       . Ferrous Sulfate (IRON) 325 (65 FE) MG TABS Take 1 tablet by mouth daily.       . fish oil-omega-3 fatty acids 1000 MG capsule Take 1 g by mouth daily.       . hydrOXYzine (ATARAX) 10 MG tablet Take 10 mg by mouth daily as needed. For itching.      . lovastatin (MEVACOR) 20 MG tablet Take 20 mg by mouth daily.        . Multiple Vitamins-Minerals (CENTRUM SILVER PO) Take 1 tablet by mouth daily.       . Multiple Vitamins-Minerals (  ICAPS PO) Take 1 tablet by mouth every other day.          (Not in a hospital admission)  Family History  Problem Relation Age of Onset  . Cancer Sister     unknown     Review of Systems:     Cardiac Review of Systems: Y or N  Chest Pain [    ]  Resting SOB [   ] Exertional SOB  [  ]  Orthopnea [  ]   Pedal Edema [   ]    Palpitations [  ] Syncope  [  ]   Presyncope [   ]  General Review of Systems: [Y] = yes [  ]=no Constitional: recent weight change [  ]; anorexia [  ]; fatigue [  ]; nausea [  ]; night sweats [  ]; fever [  ]; or chills [  ];                                                                                                                                          Dental: poor dentition[  ]; Last Dentist visit: ***  Eye : blurred vision [  ]; diplopia [   ]; vision changes [  ];  Amaurosis fugax[  ]; Resp: cough [  ];  wheezing[  ];  hemoptysis[  ]; shortness of breath[  ]; paroxysmal nocturnal dyspnea[  ]; dyspnea on exertion[  ]; or orthopnea[  ];  GI:  gallstones[  ], vomiting[  ];  dysphagia[  ]; melena[  ];  hematochezia [  ]; heartburn[  ];   Hx of  Colonoscopy[  ]; GU: kidney stones [  ]; hematuria[  ];   dysuria [  ];  nocturia[  ];  history  of     obstruction [  ];             Skin: rash, swelling[  ];, hair loss[  ];  peripheral edema[  ];  or itching[  ]; Musculosketetal: myalgias[  ];  joint swelling[  ];  joint erythema[  ];  joint pain[  ];  back pain[  ];  Heme/Lymph: bruising[  ];  bleeding[  ];  anemia[  ];  Neuro: TIA[  ];  headaches[  ];  stroke[  ];  vertigo[  ];  seizures[  ];   paresthesias[  ];  difficulty walking[  ];  Psych:depression[  ]; anxiety[  ];  Endocrine: diabetes[  ];  thyroid dysfunction[  ];  Immunizations: Flu [  ]; Pneumococcal[  ];  Other:  Physical Exam: BP 140/85  Pulse 88  Resp 20  Ht 4\' 11"  (1.499 m)  Wt 115 lb (52.164 kg)  BMI 23.23 kg/m2  SpO2 91%  {Physical Exam:3041110}   Diagnostic Studies & Laboratory data:     Recent Radiology Findings:   No results found.  Recent Lab Findings: Lab Results  Component Value Date   WBC 5.1 07/30/2011   HGB 12.2 07/30/2011   HCT 37.7 07/30/2011   PLT 194 07/30/2011   GLUCOSE 83 07/23/2011   ALT 22 03/31/2011   AST 31 07/23/2011   NA 143 07/23/2011   K 3.8 07/23/2011   CL 104 07/23/2011   CREATININE 0.8 07/23/2011   BUN 7 07/23/2011   CO2 23 07/23/2011      Assessment / Plan:            [name] MD  Beeper [phone] Office [phone] 08/25/2011 5:34 PM       log    [name] Medical Record #[id number] Date of Birth: 07-06-1930  Referring: Laurice Record, MD Primary Care: [name], MD, MD  Chief Complaint:    Chief Complaint  Patient presents with  . 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  Her wt has been stable and porta cath and feeding tube were recently removed. Follow up scans including PET show rt upper lung mass, hypermetabloic. Patient is former smoker, quit in 1990      Current Activity/ Functional Status: Patient is  independent with mobility/ambulation, transfers, ADL's, IADL's.   Past Medical History  Diagnosis Date  . 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 Dyslipidemia   . Arthritis     Hx of osteoarthritis  . Shortness of breath     WITH EXERTION   . COPD (chronic obstructive pulmonary disease)   . Anemia     Past Surgical History  Procedure Date  . Esophagogastrectomy, j tube 11/08/2010   . Cholecystectomy   . Abdominal hysterectomy   . Cholecystectomy   . Cataract extraction     RIGHT EYE   . Port-a-cath removal 05/13/2011    Procedure: REMOVAL PORT-A-CATH;  Surgeon: Almond Lint, MD;  Location: [facility] OR;  Service: General;  Laterality: N/A;  Removal port-a-cath.    Family History  Problem Relation Age of Onset  . Cancer Sister     unknown    History   Social History  . Marital Status: Widowed         Social History Main Topics  . Smoking status: Former Smoker -- 0.2 packs/day    Types: Cigarettes    Quit date: 05/16/1988  . Smokeless tobacco: Never Used  . Alcohol Use: No  . Drug Use: No  . Sexually Active: Not on file      History  Smoking status  . Former Smoker -- 0.2 packs/day  . Types: Cigarettes  . Quit date: 05/16/1988  Smokeless tobacco  . Never Used    History  Alcohol Use No     No Known Allergies  Current Outpatient Prescriptions  Medication Sig Dispense Refill  . albuterol (PROVENTIL HFA;VENTOLIN HFA) 108 (90 BASE) MCG/ACT inhaler Inhale 3 puffs into the lungs every 4 (four) hours as needed. For shortness of breath       . calcium-vitamin D (OSCAL WITH D) 500-200 MG-UNIT per tablet Take 1 tablet by mouth every other day.       . Cyanocobalamin (B-12) 100 MCG TABS Take 1 tablet by mouth as needed.       . Ferrous Sulfate (IRON) 325 (65 FE) MG TABS Take 1 tablet by mouth daily.       Marland Kitchen  fish oil-omega-3 fatty acids 1000 MG capsule Take 1 g by mouth daily.       . hydrOXYzine (ATARAX) 10 MG tablet Take 10 mg by mouth daily as needed. For itching.      .  lovastatin (MEVACOR) 20 MG tablet Take 20 mg by mouth daily.        . Multiple Vitamins-Minerals (CENTRUM SILVER PO) Take 1 tablet by mouth daily.       . Multiple Vitamins-Minerals (ICAPS PO) Take 1 tablet by mouth every other day.            Review of Systems:     Cardiac Review of Systems: Y or N  Chest Pain [  n  ]  Resting SOB [ y  ] Exertional SOB  Cove.Etienne  ]  Orthopnea [  ]   Pedal Edema [   ]    Palpitations [  ] Syncope  [  ]   Presyncope [   ]  General Review of Systems: [Y] = yes [  ]=no Constitional: recent weight change [ n ]; anorexia [n  ]; fatigue Cove.Etienne  ]; nausea [n  ]; night sweats [n  ]; fever [ n ]; or chills [n  ];                                                                                                                                          Dental: poor dentition[  ];   Eye : blurred vision [  ]; diplopia [   ]; vision changes [ n ];  Amaurosis fugax[  ]; Resp: cough [  ];  wheezing[  ];  hemoptysis[  ]; shortness of breath[ y ]; paroxysmal nocturnal dyspnea[n  ]; dyspnea on exertion[y  ]; or orthopnea[  ];  GI:  gallstones[  ], vomiting[  ];  dysphagia[  ]; melena[ n ];  hematochezia [n  ]; heartburn[  ];   Hx of  Colonoscopy[  ]; GU: kidney stones [  ]; hematuria[  ];   dysuria [  ];  nocturia[  ];  history of     obstruction [  ];             Skin: rash, swelling[  ];, hair loss[  ];  peripheral edema[  ];  or itching[  ]; Musculosketetal: myalgias[  ];  joint swelling[  ];  joint erythema[  ];  joint pain[  ];  back pain[  ];  Heme/Lymph: bruising[  ];  bleeding[  ];  anemia[  ];  Neuro: TIA[  ];  headaches[  ];  stroke[ n ];  vertigo[  ];  seizures[  ];   paresthesias[  ];  difficulty walking[  ];  Psych:depression[  ]; anxiety[  ];  Endocrine: diabetes[  ];  thyroid dysfunction[  ];  Immunizations: Flu [  ]; Pneumococcal[  ];  Other:  Physical Exam: BP 140/85  Pulse 88  Resp 20  Ht 4\' 11"  (1.499 m)  Wt 115 lb (52.164 kg)  BMI 23.23 kg/m2  SpO2  91%  General appearance: alert, cooperative, appears older than stated age, cachectic and no distress Neurologic: intact Heart: regular rate and rhythm, S1, S2 normal, no murmur, click, rub or gallop, normal apical impulse and no rub Lungs: clear to auscultation bilaterally and normal percussion bilaterally Abdomen: soft, non-tender; bowel sounds normal; no masses,  no organomegaly and healed incision Extremities: extremities normal, atraumatic, no cyanosis or edema, Homans sign is negative, no sign of DVT, no ulcers, gangrene or trophic changes and palpable pedal pulses no carotid briuts No palpable adenopathy, cervical or supraclavicular fossa or axillary   Diagnostic Studies & Laboratory data:    Nm Pet Image Initial (pi) Skull Base To Thigh  08/11/2011  *RADIOLOGY REPORT*  Clinical Data: Subsequent treatment strategy for gastric cancer.  NUCLEAR MEDICINE PET SKULL BASE TO THIGH  Fasting Blood Glucose:  85  Technique:  19.3 mCi F-18 FDG was injected intravenously. CT data was obtained and used for attenuation correction and anatomic localization only.  (This was not acquired as a diagnostic CT examination.) Additional exam technical data entered on technologist worksheet.  Comparison:  Multiple priors, most recently PET CT dated 12/20/2010, and chest CT dated 07/23/2011.  Findings:  Neck: No hypermetabolic lymph nodes in the neck.  There is some symmetrically increased activity in the region of the tonsillar pillars bilaterally, favored be reactive as there is no soft tissue abnormality noted on the CT portion of the examination.  Chest: There has been some interval growth of what is now a 3.2 x 2.1 cm pleural based mass in the medial aspect of the right upper lobe apex which is hypermetabolic (SUVmax = 6.9).  Linear opacities in the medial segment of the right middle lobe and the inferior segment of the lingula likely reflect areas of atelectasis and/or scarring.  No additional definite  suspicious-appearing pulmonary nodules or masses are otherwise noted.  There is a new small left- sided pleural effusion. Heart size is mildly enlarged. There is no significant pericardial fluid, thickening or pericardial calcification. There is atherosclerosis of the thoracic aorta, the great vessels of the mediastinum and the coronary arteries, including calcified atherosclerotic plaque in the left main, left anterior descending, left circumflex and right coronary arteries. No pathologically enlarged or hypermetabolic mediastinal or hilar lymph nodes.    A nonenlarged (8 mm short axis) left axillary lymph node is noted that demonstrates some low level increased metabolic activity (SUVmax = 2.3), which is similar in retrospect compared to prior PET CT 12/20/2010, and highly nonspecific, presumably reactive.  Abdomen/Pelvis:  No abnormal hypermetabolic activity within the liver, pancreas, adrenal glands, or spleen.  No hypermetabolic lymph nodes in the abdomen or pelvis.  Postoperative changes of partial gastrectomy near the GE junction are noted.  Status post cholecystectomy.  Soft tissue in the region of the pancreaticoduodenal groove is indistinct, and there is stranding throughout the peripancreatic retroperitoneal soft tissues.  Mild bilateral perinephric stranding is also noted.  Low attenuation is noted throughout the second and third segments of the liver, likely to represent focal fatty infiltration atherosclerosis of the abdominal and pelvic vasculature, without focal aneurysm.  Small volume of ascites layering within the cul-de-sac.  Status post hysterectomy. Previously noted jejunostomy tube has been removed and there is a healed tract in the anterior abdominal wall overlying left upper quadrant of the abdomen.  [place]:  No focal hypermetabolic activity to suggest skeletal metastasis.  IMPRESSION: 1.  3.2 x 2.1 cm pleural based hypermetabolic mass in the medial aspect of the right lung apex is  consistent with malignancy. Whether not this represents a solitary pulmonary metastasis (this would be an unusual location for metastatic lesion), or a primary lung lesion is uncertain. 2.  Nonenlarged 8 mm left axillary lymph node with low-level increased metabolic activity, similar to prior study.  This finding is nonspecific and presumably reactive, however, continued attention on follow-up studies is recommended. 3.  Poor definition of soft tissues in the pancreaticoduodenal groove with stranding throughout the peripancreatic fat of the retroperitoneum.  Findings are nonspecific, but can be seen in the setting of acute pancreatitis.  Clinical correlation is recommended. 3.  There is also a small volume of ascites layering within the cul- de-sac. 4.  Focal fatty infiltration in segments two and three in the liver. 5. Atherosclerosis, including left main and three-vessel coronary artery disease. Please note that although the presence of coronary artery calcium documents the presence of coronary artery disease, the severity of this disease and any potential stenosis cannot be assessed on this non-gated CT examination.  Assessment for potential risk factor modification, dietary therapy or pharmacologic therapy may be warranted, if clinically indicated. 6.  Additional postoperative changes, as above.  These results will be called to the ordering clinician or representative by the Radiologist Assistant, and communication documented in the PACS Dashboard.  Original Report Authenticated By: Florencia Reasons, M.D.    CT CHEST WITH CONTRAST   Technique:  Multidetector CT imaging of the chest was performed following the standard protocol during bolus administration of intravenous contrast.   Contrast: 80mL OMNIPAQUE IOHEXOL 300 MG/ML IJ SOLN   Comparison: CT of the abdomen and pelvis 06/12/2011.  CT of chest 05/30/2010.   Findings:   Mediastinum: Heart size is normal. There is no significant pericardial  fluid, thickening or pericardial calcification.   The esophagus is patulous and fluid-filled. No pathologically enlarged mediastinal or hilar lymph nodes.   There is atherosclerosis of the thoracic aorta, the great vessels of the mediastinum and the coronary arteries, including calcified atherosclerotic plaque in the left anterior descending, left circumflex and right coronary arteries.   Lungs/Pleura: Within the medial aspect of the apex of the right upper lobe there is a new 2.8 x 1.9 cm mass which abuts the mediastinal pleura, without definitive evidence to suggest mediastinal invasion at this time.  No additional suspicious appearing pulmonary nodules or masses are otherwise identified. However, there are some new opacities in the medial aspects of the lung bases bilaterally which are favored to represent areas of scarring (however, some neoplasms such as gastric adenocarcinoma can have a very infiltrative and ill-defined appearance with their metastases).  There is a background of moderate centrilobular emphysema.  Some faint subpleural reticulation is scattered throughout the periphery of the lungs bilaterally, similar compared to the prior examination, without frank honeycombing or traction bronchiectasis to strongly suggest the presence of an underlying interstitial lung disease.  No pleural effusions.   Upper Abdomen: Postsurgical changes likely from prior partial gastrectomy are noted.  A surgical clip is noted in the gallbladder fossa consistent with prior history of cholecystectomy.   Musculoskeletal: There are no aggressive appearing lytic or blastic lesions noted in the visualized portions of the skeleton.   IMPRESSION: 1.  Interval development of a 2.8 x 1.9 cm macrolobulated nodule in the medial aspect of the right  upper lobe that abuts the pleura overlying the mediastinum (without definitive evidence of mediastinal invasion at this time).  This finding is  concerning for potential solitary metastasis or primary bronchogenic neoplasm, although this may less likely represent a focus of inflammation/infection.  No other definite evidence of metastatic disease is otherwise noted. 2. Atherosclerosis, including three-vessel coronary artery disease. Please note that although the presence of coronary artery calcium documents the presence of coronary artery disease, the severity of this disease and any potential stenosis cannot be assessed on this non-gated CT examination.  Assessment for potential risk factor modification, dietary therapy or pharmacologic therapy may be warranted, if clinically indicated. 3. Background of moderate centrilobular emphysema again noted.   These results were called by telephone on 07/23/2011  at  02:38 p.m. to  Dr. Dalene Carrow, who verbally acknowledged these results.   Original Report Authenticated By: Florencia Reasons, M.D.       Recent Lab Findings: Lab Results  Component Value Date   WBC 5.1 07/30/2011   HGB 12.2 07/30/2011   HCT 37.7 07/30/2011   PLT 194 07/30/2011   GLUCOSE 83 07/23/2011   ALT 22 03/31/2011   AST 31 07/23/2011   NA 143 07/23/2011   K 3.8 07/23/2011   CL 104 07/23/2011   CREATININE 0.8 07/23/2011   BUN 7 07/23/2011   CO2 23 07/23/2011      Assessment / Plan:   1,New enlarging rt upper lobe lung lesion in patient with recently resected pT4N2 adeno CA of cardia, unclear if MET or new primary lung CA 2, COPD 3, No history of Coronary Disease but CT evidence of calcified left main.  I have reviewed the films and discussed with the patient the findings,she has limited pulmonary reserve by history. Ideally bx of the lesion in rt upper lobe to determine if it is primary lung vs gastric met. I will obtain the original ct disk dicom data and review if ENB (endobronchial navigation) can be used to obtain tissue DX. The location of the mass and degree of pulmonary disease will make it difficult to do  percutaneous needle BX. She will come back next week to review options.           [name] MD  Beeper [phone] Office [phone] 08/25/2011 5:34 PM

## 2011-08-25 NOTE — Progress Notes (Addendum)
[address]            [place] [address]          [phone]        ALEAYAH CHICO [facility] Medical Record #[id number] Date of Birth: 09/29/1930  Referring: Laurice Record, MD Primary Care: [name], MD, MD  Chief Complaint:    Chief Complaint  Patient presents with  . 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  Her wt has been stable and porta cath and feeding tube were recently removed. Follow up scans including PET show rt upper lung mass, hypermetabloic. Patient is former smoker, quit in 1990  I was unable to locate the dicom disc.    Current Activity/ Functional Status: Patient is independent with mobility/ambulation, transfers, ADL's, IADL's.   Past Medical History  Diagnosis Date  . 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 Dyslipidemia   . Arthritis     Hx of osteoarthritis  . Shortness of breath     WITH EXERTION   . COPD (chronic obstructive pulmonary disease)   . Anemia     Past Surgical History  Procedure Date  . Esophagogastrectomy, j tube 11/08/2010   . Cholecystectomy   . Abdominal hysterectomy   . Cholecystectomy   . Cataract extraction     RIGHT EYE   . Port-a-cath removal 05/13/2011    Procedure: REMOVAL PORT-A-CATH;  Surgeon: Almond Lint, MD;  Location: [facility] OR;  Service: General;  Laterality: N/A;  Removal port-a-cath.    Family History  Problem Relation Age of Onset  . Cancer Sister     unknown  History of treated TB in brother, but at time patient did not have contact with  the brother  History   Social History  . Marital Status: Widowed         Social History Main Topics  . Smoking status: Former Smoker -- 0.2 packs/day    Types: Cigarettes    Quit date: 05/16/1988  . Smokeless tobacco: Never Used  . Alcohol Use: No  . Drug  Use: No  . Sexually Active: Not on file      History  Smoking status  . Former Smoker -- 0.2 packs/day  . Types: Cigarettes  . Quit date: 05/16/1988  Smokeless tobacco  . Never Used    History  Alcohol Use No     No Known Allergies  Current Outpatient Prescriptions  Medication Sig Dispense Refill  . albuterol (PROVENTIL HFA;VENTOLIN HFA) 108 (90 BASE) MCG/ACT inhaler Inhale 3 puffs into the lungs every 4 (four) hours as needed. For shortness of breath       . calcium-vitamin D (OSCAL WITH D) 500-200 MG-UNIT per tablet Take 1 tablet by mouth every other day.       . Cyanocobalamin (B-12) 100 MCG TABS Take 1 tablet by mouth as needed.       . Ferrous Sulfate (IRON) 325 (65 FE) MG TABS Take 1 tablet by mouth daily.       . fish oil-omega-3 fatty acids 1000 MG capsule Take 1 g by mouth daily.       Marland Kitchen  hydrOXYzine (ATARAX) 10 MG tablet Take 10 mg by mouth daily as needed. For itching.      . lovastatin (MEVACOR) 20 MG tablet Take 20 mg by mouth daily.        . Multiple Vitamins-Minerals (CENTRUM SILVER PO) Take 1 tablet by mouth daily.       . Multiple Vitamins-Minerals (ICAPS PO) Take 1 tablet by mouth every other day.            Review of Systems:     Cardiac Review of Systems: Y or N  Chest Pain [  n  ]  Resting SOB [ y  ] Exertional SOB  Cove.Etienne  ]  Orthopnea [  ]   Pedal Edema [   ]    Palpitations [  ] Syncope  [  ]   Presyncope [   ]  General Review of Systems: [Y] = yes [  ]=no Constitional: recent weight change [ n ]; anorexia [n  ]; fatigue Cove.Etienne  ]; nausea [n  ]; night sweats [n  ]; fever [ n ]; or chills [n  ];                                                                                                                                          Dental: poor dentition[  ];   Eye : blurred vision [  ]; diplopia [   ]; vision changes [ n ];  Amaurosis fugax[  ]; Resp: cough [  ];  wheezing[  ];  hemoptysis[  ]; shortness of breath[ y ]; paroxysmal nocturnal dyspnea[n  ];  dyspnea on exertion[y  ]; or orthopnea[  ];  GI:  gallstones[  ], vomiting[  ];  dysphagia[  ]; melena[ n ];  hematochezia [n  ]; heartburn[  ];   Hx of  Colonoscopy[  ]; GU: kidney stones [  ]; hematuria[  ];   dysuria [  ];  nocturia[  ];  history of     obstruction [  ];             Skin: rash, swelling[  ];, hair loss[  ];  peripheral edema[  ];  or itching[  ]; Musculosketetal: myalgias[  ];  joint swelling[  ];  joint erythema[  ];  joint pain[  ];  back pain[  ];  Heme/Lymph: bruising[  ];  bleeding[  ];  anemia[  ];  Neuro: TIA[  ];  headaches[  ];  stroke[ n ];  vertigo[  ];  seizures[  ];   paresthesias[  ];  difficulty walking[  ];  Psych:depression[  ]; anxiety[  ];  Endocrine: diabetes[  ];  thyroid dysfunction[  ];  Immunizations: Flu [  ]; Pneumococcal[  ];  Other:  Physical Exam: BP 140/85  Pulse 88  Resp 20  Ht 4\' 11"  (1.499 m)  Wt 115  lb (52.164 kg)  BMI 23.23 kg/m2  SpO2 91%  General appearance: alert, cooperative, appears older than stated age, cachectic and no distress Neurologic: intact Heart: regular rate and rhythm, S1, S2 normal, no murmur, click, rub or gallop, normal apical impulse and no rub Lungs: clear to auscultation bilaterally and normal percussion bilaterally Abdomen: soft, non-tender; bowel sounds normal; no masses,  no organomegaly and healed incision Extremities: extremities normal, atraumatic, no cyanosis or edema, Homans sign is negative, no sign of DVT, no ulcers, gangrene or trophic changes and palpable pedal pulses no carotid briuts No palpable adenopathy, cervical or supraclavicular fossa or axillary No change in her physical exams and she was examined last week  Diagnostic Studies & Laboratory data:    Nm Pet Image Initial (pi) Skull Base To Thigh  08/11/2011  *RADIOLOGY REPORT*  Clinical Data: Subsequent treatment strategy for gastric cancer.  NUCLEAR MEDICINE PET SKULL BASE TO THIGH  Fasting Blood Glucose:  85  Technique:  19.3 mCi F-18  FDG was injected intravenously. CT data was obtained and used for attenuation correction and anatomic localization only.  (This was not acquired as a diagnostic CT examination.) Additional exam technical data entered on technologist worksheet.  Comparison:  Multiple priors, most recently PET CT dated 12/20/2010, and chest CT dated 07/23/2011.  Findings:  Neck: No hypermetabolic lymph nodes in the neck.  There is some symmetrically increased activity in the region of the tonsillar pillars bilaterally, favored be reactive as there is no soft tissue abnormality noted on the CT portion of the examination.  Chest: There has been some interval growth of what is now a 3.2 x 2.1 cm pleural based mass in the medial aspect of the right upper lobe apex which is hypermetabolic (SUVmax = 6.9).  Linear opacities in the medial segment of the right middle lobe and the inferior segment of the lingula likely reflect areas of atelectasis and/or scarring.  No additional definite suspicious-appearing pulmonary nodules or masses are otherwise noted.  There is a new small left- sided pleural effusion. Heart size is mildly enlarged. There is no significant pericardial fluid, thickening or pericardial calcification. There is atherosclerosis of the thoracic aorta, the great vessels of the mediastinum and the coronary arteries, including calcified atherosclerotic plaque in the left main, left anterior descending, left circumflex and right coronary arteries. No pathologically enlarged or hypermetabolic mediastinal or hilar lymph nodes.    A nonenlarged (8 mm short axis) left axillary lymph node is noted that demonstrates some low level increased metabolic activity (SUVmax = 2.3), which is similar in retrospect compared to prior PET CT 12/20/2010, and highly nonspecific, presumably reactive.  Abdomen/Pelvis:  No abnormal hypermetabolic activity within the liver, pancreas, adrenal glands, or spleen.  No hypermetabolic lymph nodes in the abdomen  or pelvis.  Postoperative changes of partial gastrectomy near the GE junction are noted.  Status post cholecystectomy.  Soft tissue in the region of the pancreaticoduodenal groove is indistinct, and there is stranding throughout the peripancreatic retroperitoneal soft tissues.  Mild bilateral perinephric stranding is also noted.  Low attenuation is noted throughout the second and third segments of the liver, likely to represent focal fatty infiltration atherosclerosis of the abdominal and pelvic vasculature, without focal aneurysm.  Small volume of ascites layering within the cul-de-sac.  Status post hysterectomy. Previously noted jejunostomy tube has been removed and there is a healed tract in the anterior abdominal wall overlying left upper quadrant of the abdomen.  [place]:  No focal hypermetabolic activity to  suggest skeletal metastasis.  IMPRESSION: 1.  3.2 x 2.1 cm pleural based hypermetabolic mass in the medial aspect of the right lung apex is consistent with malignancy. Whether not this represents a solitary pulmonary metastasis (this would be an unusual location for metastatic lesion), or a primary lung lesion is uncertain. 2.  Nonenlarged 8 mm left axillary lymph node with low-level increased metabolic activity, similar to prior study.  This finding is nonspecific and presumably reactive, however, continued attention on follow-up studies is recommended. 3.  Poor definition of soft tissues in the pancreaticoduodenal groove with stranding throughout the peripancreatic fat of the retroperitoneum.  Findings are nonspecific, but can be seen in the setting of acute pancreatitis.  Clinical correlation is recommended. 3.  There is also a small volume of ascites layering within the cul- de-sac. 4.  Focal fatty infiltration in segments two and three in the liver. 5. Atherosclerosis, including left main and three-vessel coronary artery disease. Please note that although the presence of coronary artery calcium  documents the presence of coronary artery disease, the severity of this disease and any potential stenosis cannot be assessed on this non-gated CT examination.  Assessment for potential risk factor modification, dietary therapy or pharmacologic therapy may be warranted, if clinically indicated. 6.  Additional postoperative changes, as above.  These results will be called to the ordering clinician or representative by the Radiologist Assistant, and communication documented in the PACS Dashboard.  Original Report Authenticated By: Florencia Reasons, M.D.    CT CHEST WITH CONTRAST   Technique:  Multidetector CT imaging of the chest was performed following the standard protocol during bolus administration of intravenous contrast.   Contrast: 80mL OMNIPAQUE IOHEXOL 300 MG/ML IJ SOLN   Comparison: CT of the abdomen and pelvis 06/12/2011.  CT of chest 05/30/2010.   Findings:   Mediastinum: Heart size is normal. There is no significant pericardial fluid, thickening or pericardial calcification.   The esophagus is patulous and fluid-filled. No pathologically enlarged mediastinal or hilar lymph nodes.   There is atherosclerosis of the thoracic aorta, the great vessels of the mediastinum and the coronary arteries, including calcified atherosclerotic plaque in the left anterior descending, left circumflex and right coronary arteries.   Lungs/Pleura: Within the medial aspect of the apex of the right upper lobe there is a new 2.8 x 1.9 cm mass which abuts the mediastinal pleura, without definitive evidence to suggest mediastinal invasion at this time.  No additional suspicious appearing pulmonary nodules or masses are otherwise identified. However, there are some new opacities in the medial aspects of the lung bases bilaterally which are favored to represent areas of scarring (however, some neoplasms such as gastric adenocarcinoma can have a very infiltrative and ill-defined appearance with  their metastases).  There is a background of moderate centrilobular emphysema.  Some faint subpleural reticulation is scattered throughout the periphery of the lungs bilaterally, similar compared to the prior examination, without frank honeycombing or traction bronchiectasis to strongly suggest the presence of an underlying interstitial lung disease.  No pleural effusions.   Upper Abdomen: Postsurgical changes likely from prior partial gastrectomy are noted.  A surgical clip is noted in the gallbladder fossa consistent with prior history of cholecystectomy.   Musculoskeletal: There are no aggressive appearing lytic or blastic lesions noted in the visualized portions of the skeleton.   IMPRESSION: 1.  Interval development of a 2.8 x 1.9 cm macrolobulated nodule in the medial aspect of the right upper lobe that abuts the pleura overlying  the mediastinum (without definitive evidence of mediastinal invasion at this time).  This finding is concerning for potential solitary metastasis or primary bronchogenic neoplasm, although this may less likely represent a focus of inflammation/infection.  No other definite evidence of metastatic disease is otherwise noted.  2. Atherosclerosis, including three-vessel coronary artery disease. Please note that although the presence of coronary artery calcium documents the presence of coronary artery disease, the severity of this disease and any potential stenosis cannot be assessed on this non-gated CT examination.  Assessment for potential risk factor modification, dietary therapy or pharmacologic therapy may be warranted, if clinically indicated. 3. Background of moderate centrilobular emphysema again noted.         Recent Lab Findings: Lab Results  Component Value Date   WBC 5.1 07/30/2011   HGB 12.2 07/30/2011   HCT 37.7 07/30/2011   PLT 194 07/30/2011   GLUCOSE 83 07/23/2011   ALT 22 03/31/2011   AST 31 07/23/2011   NA 143 07/23/2011   K 3.8  07/23/2011   CL 104 07/23/2011   CREATININE 0.8 07/23/2011   BUN 7 07/23/2011   CO2 23 07/23/2011      Assessment / Plan:   1,New enlarging rt upper lobe lung lesion in patient with recently resected pT4N2 adeno CA of cardia, unclear if MET or new primary lung CA 2, COPD, PFT done but have been unable to get results yet will call tomorrow 3, No history of Coronary Disease but CT evidence of calcified left main. She tolerated recent gastrectomy without difficulty.  I have reviewed the films and discussed with the patient the findings,she has limited pulmonary reserve by history. I discussed with patient proceeding with bronchoscopy and ENB to obtain a possible tissue diagnosis. It is not clear if the current lesion is related to a previous gastric carcinoma, a new lung primary, or an inflammatory mass developing while on chemotherapy.  The goals risks and alternatives of the planned surgical procedure Bronch ENB have been discussed with the patient in detail. The risks of the procedure including death, infection, stroke, myocardial infarction, bleeding, blood transfusion have all been discussed specifically.  I have quoted [name] a 1 % of perioperative mortality and a complication rate as high as 10%. The patient's questions have been answered.TUCKER STEEDLEY is willing  to proceed with the planned procedure. We'll plan to proceed on April 24     [name] MD  Beeper [phone] Office [phone] 08/25/2011 5:35 PM

## 2011-08-26 ENCOUNTER — Encounter (HOSPITAL_COMMUNITY)
Admission: RE | Admit: 2011-08-26 | Discharge: 2011-08-26 | Disposition: A | Discharge status: Home / Self Care | Payer: Medicare Other | Source: Ambulatory Visit | Attending: Cardiothoracic Surgery | Admitting: Cardiothoracic Surgery

## 2011-08-26 ENCOUNTER — Encounter (HOSPITAL_COMMUNITY): Payer: Self-pay

## 2011-08-26 ENCOUNTER — Encounter (HOSPITAL_COMMUNITY): Payer: Self-pay | Admitting: Respiratory Therapy

## 2011-08-26 ENCOUNTER — Other Ambulatory Visit: Payer: Self-pay

## 2011-08-26 VITALS — BP 132/82 | HR 84 | Temp 97.2°F | Resp 20 | Ht 59.0 in | Wt 113.0 lb

## 2011-08-26 DIAGNOSIS — D381 Neoplasm of uncertain behavior of trachea, bronchus and lung: Secondary | ICD-10-CM

## 2011-08-26 LAB — COMPREHENSIVE METABOLIC PANEL
ALT: 18 U/L (ref 0–35)
AST: 33 U/L (ref 0–37)
Albumin: 3.8 g/dL (ref 3.5–5.2)
Alkaline Phosphatase: 121 U/L — ABNORMAL HIGH (ref 39–117)
BUN: 9 mg/dL (ref 6–23)
CO2: 27 mEq/L (ref 19–32)
Calcium: 9.8 mg/dL (ref 8.4–10.5)
Chloride: 107 mEq/L (ref 96–112)
Creatinine, Ser: 0.64 mg/dL (ref 0.50–1.10)
GFR calc Af Amer: >90 mL/min (ref >90)
GFR calc non Af Amer: 83 mL/min — ABNORMAL LOW (ref >90)
Glucose, Bld: 88 mg/dL (ref 70–99)
Potassium: 3.8 mEq/L (ref 3.5–5.1)
Sodium: 143 mEq/L (ref 135–145)
Total Bilirubin: 0.3 mg/dL (ref 0.3–1.2)
Total Protein: 7.1 g/dL (ref 6.0–8.3)

## 2011-08-26 LAB — CBC
HCT: 38.2 % (ref 36.0–46.0)
Hemoglobin: 12.5 g/dL (ref 12.0–15.0)
MCH: 27.9 pg (ref 26.0–34.0)
MCHC: 32.7 g/dL (ref 30.0–36.0)
MCV: 85.3 fL (ref 78.0–100.0)
Platelets: 202 10*3/uL (ref 150–400)
RBC: 4.48 MIL/uL (ref 3.87–5.11)
RDW: 16.8 % — ABNORMAL HIGH (ref 11.5–15.5)
WBC: 4.9 10*3/uL (ref 4.0–10.5)

## 2011-08-26 LAB — PROTIME-INR
INR: 0.95 (ref 0.00–1.49)
Prothrombin Time: 12.9 seconds (ref 11.6–15.2)

## 2011-08-26 LAB — APTT: aPTT: 31 seconds (ref 24–37)

## 2011-08-26 NOTE — Progress Notes (Signed)
Noted that time had changed since pt's PAT appt (after orders put in system.  Attempted to call pt's home#[phone].  Message left on niece's voice mail for pt to be here @8 :30AM instead of 6:30AM.D. Neva Seat, PAT RN, notified.//L. Tyrone Pautsch,RN

## 2011-08-26 NOTE — Progress Notes (Signed)
Notified patient of surgery time change to 1635pm.  New arrival time 8:30am.

## 2011-08-26 NOTE — Progress Notes (Signed)
Pt stated that her PCP is w/[facility] Dr. Jerelyn Scott Husain(#[phone]).  The PT reports having stress test but it was over 10 yrs ago.  Denies seeing cardiologist on routine basis.//L. Nels Munn,RN

## 2011-08-26 NOTE — Progress Notes (Signed)
Revonda Standard at Dr. Dennie Maizes office notified that PAT visit is finished and no orders in EPIC.  She stated she would put in EPIC.  Will await.//L. Daniqua Campoy,RN

## 2011-08-26 NOTE — Progress Notes (Signed)
Erica notified of no orders in chart. Office notified.//L. Denelle Capurro,RN

## 2011-08-26 NOTE — Pre-Procedure Instructions (Addendum)
20 [name]  08/26/2011   Your procedure is scheduled on:  Wednesday, 08/27/2011 @9 :32AM.  Report to [facility] at 7:30 AM.  Call this number if you have problems the morning of surgery: [phone]   Remember:   Do not eat food:After Midnight.  May have clear liquids: up to 4 Hours before arrival( nothing after 3:30AM).  Clear liquids include soda, tea, black coffee, apple or grape juice, broth.  Take these medicines the morning of surgery with A SIP OF WATER: Albuterol.   Do not wear jewelry, make-up or nail polish.  Do not wear lotions, powders, or perfumes. You may wear deodorant.  Do not shave 48 hours prior to surgery.  Do not bring valuables to the hospital.  Contacts, dentures or bridgework may not be worn into surgery.  Leave suitcase in the car. After surgery it may be brought to your room.  For patients admitted to the hospital, checkout time is 11:00 AM the day of discharge.   Patients discharged the day of surgery will not be allowed to drive home.  Name and phone number of your driver. Niece, [name](cell#[phone]).  Special Instructions: CHG Shower Use Special Wash: 1/2 bottle night before surgery and 1/2 bottle morning of surgery.   Please read over the following fact sheets that you were given: Pain Booklet, Coughing and Deep Breathing and Surgical Site Infection Prevention

## 2011-08-27 ENCOUNTER — Encounter (HOSPITAL_COMMUNITY): Payer: Self-pay | Admitting: Surgery

## 2011-08-27 ENCOUNTER — Ambulatory Visit (HOSPITAL_COMMUNITY)
Admission: RE | Admit: 2011-08-27 | Discharge: 2011-08-27 | Disposition: A | Discharge status: Home / Self Care | Payer: Medicare Other | Source: Ambulatory Visit | Attending: Cardiothoracic Surgery | Admitting: Cardiothoracic Surgery

## 2011-08-27 DIAGNOSIS — Z538 Procedure and treatment not carried out for other reasons: Secondary | ICD-10-CM | POA: Insufficient documentation

## 2011-08-27 DIAGNOSIS — D381 Neoplasm of uncertain behavior of trachea, bronchus and lung: Secondary | ICD-10-CM

## 2011-08-27 DIAGNOSIS — R222 Localized swelling, mass and lump, trunk: Secondary | ICD-10-CM | POA: Insufficient documentation

## 2011-08-28 ENCOUNTER — Inpatient Hospital Stay (HOSPITAL_COMMUNITY): Payer: Medicare Other

## 2011-08-28 ENCOUNTER — Encounter (HOSPITAL_COMMUNITY)
Admission: RE | Disposition: A | Discharge status: Home / Self Care | Payer: Self-pay | Source: Ambulatory Visit | Attending: Cardiothoracic Surgery

## 2011-08-28 ENCOUNTER — Ambulatory Visit (HOSPITAL_COMMUNITY)
Admission: RE | Admit: 2011-08-28 | Discharge: 2011-08-28 | Disposition: A | Discharge status: Home / Self Care | Payer: Medicare Other | Source: Ambulatory Visit | Attending: Cardiothoracic Surgery | Admitting: Cardiothoracic Surgery

## 2011-08-28 ENCOUNTER — Encounter (HOSPITAL_COMMUNITY): Payer: Self-pay | Admitting: Anesthesiology

## 2011-08-28 ENCOUNTER — Inpatient Hospital Stay (HOSPITAL_COMMUNITY): Payer: Medicare Other | Admitting: Anesthesiology

## 2011-08-28 ENCOUNTER — Encounter (HOSPITAL_COMMUNITY): Payer: Self-pay | Admitting: *Deleted

## 2011-08-28 DIAGNOSIS — D381 Neoplasm of uncertain behavior of trachea, bronchus and lung: Secondary | ICD-10-CM

## 2011-08-28 DIAGNOSIS — Z01818 Encounter for other preprocedural examination: Secondary | ICD-10-CM | POA: Insufficient documentation

## 2011-08-28 DIAGNOSIS — Z923 Personal history of irradiation: Secondary | ICD-10-CM | POA: Insufficient documentation

## 2011-08-28 DIAGNOSIS — J984 Other disorders of lung: Secondary | ICD-10-CM | POA: Insufficient documentation

## 2011-08-28 DIAGNOSIS — J449 Chronic obstructive pulmonary disease, unspecified: Secondary | ICD-10-CM | POA: Insufficient documentation

## 2011-08-28 DIAGNOSIS — Z01812 Encounter for preprocedural laboratory examination: Secondary | ICD-10-CM | POA: Insufficient documentation

## 2011-08-28 DIAGNOSIS — Z85028 Personal history of other malignant neoplasm of stomach: Secondary | ICD-10-CM | POA: Insufficient documentation

## 2011-08-28 DIAGNOSIS — E785 Hyperlipidemia, unspecified: Secondary | ICD-10-CM | POA: Insufficient documentation

## 2011-08-28 DIAGNOSIS — Z79899 Other long term (current) drug therapy: Secondary | ICD-10-CM | POA: Insufficient documentation

## 2011-08-28 DIAGNOSIS — J4489 Other specified chronic obstructive pulmonary disease: Secondary | ICD-10-CM | POA: Insufficient documentation

## 2011-08-28 HISTORY — PX: BRONCHOSCOPY: SUR163

## 2011-08-28 SURGERY — VIDEO BRONCHOSCOPY WITH ENDOBRONCHIAL NAVIGATION
Anesthesia: General | Site: Chest

## 2011-08-28 SURGERY — VIDEO BRONCHOSCOPY WITH ENDOBRONCHIAL NAVIGATION
Anesthesia: General

## 2011-08-28 MED ORDER — LABETALOL HCL 5 MG/ML IV SOLN
INTRAVENOUS | Status: DC | PRN
  Administered 2011-08-28: 5 mg via INTRAVENOUS

## 2011-08-28 MED ORDER — NEOSTIGMINE METHYLSULFATE 1 MG/ML IJ SOLN
INTRAMUSCULAR | Status: DC | PRN
  Administered 2011-08-28: 2 mg via INTRAVENOUS

## 2011-08-28 MED ORDER — HYDROMORPHONE HCL PF 1 MG/ML IJ SOLN: 0.2500 mg | INTRAMUSCULAR | Status: DC | PRN

## 2011-08-28 MED ORDER — ONDANSETRON HCL 4 MG/2ML IJ SOLN: 4.0000 mg | Freq: Once | INTRAMUSCULAR | Status: DC | PRN

## 2011-08-28 MED ORDER — LACTATED RINGERS IV SOLN
INTRAVENOUS | Status: DC | PRN
  Administered 2011-08-28 (×2): via INTRAVENOUS

## 2011-08-28 MED ORDER — ONDANSETRON HCL 4 MG/2ML IJ SOLN
INTRAMUSCULAR | Status: DC | PRN
  Administered 2011-08-28: 4 mg via INTRAVENOUS

## 2011-08-28 MED ORDER — PROPOFOL 10 MG/ML IV EMUL
INTRAVENOUS | Status: DC | PRN
  Administered 2011-08-28: 100 mg via INTRAVENOUS
  Administered 2011-08-28: 50 mg via INTRAVENOUS

## 2011-08-28 MED ORDER — LIDOCAINE HCL (CARDIAC) 20 MG/ML IV SOLN
INTRAVENOUS | Status: DC | PRN
  Administered 2011-08-28: 40 mg via INTRAVENOUS

## 2011-08-28 MED ORDER — PHENYLEPHRINE HCL 10 MG/ML IJ SOLN
INTRAMUSCULAR | Status: DC | PRN
  Administered 2011-08-28 (×2): 40 ug via INTRAVENOUS
  Administered 2011-08-28 (×3): 80 ug via INTRAVENOUS
  Administered 2011-08-28: 40 ug via INTRAVENOUS

## 2011-08-28 MED ORDER — ROCURONIUM BROMIDE 100 MG/10ML IV SOLN
INTRAVENOUS | Status: DC | PRN
  Administered 2011-08-28: 25 mg via INTRAVENOUS

## 2011-08-28 MED ORDER — FENTANYL CITRATE 0.05 MG/ML IJ SOLN
INTRAMUSCULAR | Status: DC | PRN
  Administered 2011-08-28: 150 ug via INTRAVENOUS

## 2011-08-28 MED ORDER — GLYCOPYRROLATE 0.2 MG/ML IJ SOLN
INTRAMUSCULAR | Status: DC | PRN
  Administered 2011-08-28: .4 mg via INTRAVENOUS

## 2011-08-28 SURGICAL SUPPLY — 31 items
BRUSH SUPERTRAX BIOPSY (INSTRUMENTS) IMPLANT
BRUSH SUPERTRAX NDL-TIP CYTO (INSTRUMENTS) IMPLANT
CANISTER SUCTION 2500CC (MISCELLANEOUS) ×2 IMPLANT
CHANNEL WORK EXTEND EDGE 180 (KITS) IMPLANT
CHANNEL WORK EXTEND EDGE 45 (KITS) IMPLANT
CHANNEL WORK EXTEND EDGE 90 (KITS) IMPLANT
CLOTH BEACON ORANGE TIMEOUT ST (SAFETY) ×2 IMPLANT
CONT SPEC 4OZ CLIKSEAL STRL BL (MISCELLANEOUS) ×2 IMPLANT
COVER TABLE BACK 60X90 (DRAPES) ×2 IMPLANT
FILTER STRAW FLUID ASPIR (MISCELLANEOUS) IMPLANT
FORCEPS BIOP SUPERTRX PREMAR (INSTRUMENTS) IMPLANT
GLOVE BIO SURGEON STRL SZ 6.5 (GLOVE) ×2 IMPLANT
KIT LOCATABLE GUIDE (CANNULA) IMPLANT
KIT MARKER FIDUCIAL DELIVERY (KITS) IMPLANT
KIT PROCEDURE EDGE 180 (KITS) IMPLANT
KIT PROCEDURE EDGE 45 (KITS) IMPLANT
KIT PROCEDURE EDGE 90 (KITS) IMPLANT
KIT ROOM TURNOVER OR (KITS) ×2 IMPLANT
MARKER SKIN DUAL TIP RULER LAB (MISCELLANEOUS) ×2 IMPLANT
NDL SUPERTRX PREMARK BIOPSY (NEEDLE) IMPLANT
NEEDLE SUPERTRX PREMARK BIOPSY (NEEDLE) IMPLANT
NS IRRIG 1000ML POUR BTL (IV SOLUTION) ×2 IMPLANT
OIL SILICONE PENTAX (PARTS (SERVICE/REPAIRS)) ×2 IMPLANT
PAD ARMBOARD 7.5X6 YLW CONV (MISCELLANEOUS) ×4 IMPLANT
PATCHES PATIENT (LABEL) ×2 IMPLANT
SPONGE GAUZE 4X4 12PLY (GAUZE/BANDAGES/DRESSINGS) ×2 IMPLANT
SYR 20ML ECCENTRIC (SYRINGE) ×2 IMPLANT
SYR 30ML LL (SYRINGE) IMPLANT
TOWEL OR 17X24 6PK STRL BLUE (TOWEL DISPOSABLE) ×2 IMPLANT
TRAP SPECIMEN MUCOUS 40CC (MISCELLANEOUS) ×2 IMPLANT
TUBE CONNECTING 12X1/4 (SUCTIONS) ×2 IMPLANT

## 2011-08-28 SURGICAL SUPPLY — 32 items
BRUSH SUPERTRAX BIOPSY (INSTRUMENTS) ×1 IMPLANT
BRUSH SUPERTRAX NDL-TIP CYTO (INSTRUMENTS) ×1 IMPLANT
CANISTER SUCTION 2500CC (MISCELLANEOUS) ×2 IMPLANT
CHANNEL WORK EXTEND EDGE 180 (KITS) ×1 IMPLANT
CHANNEL WORK EXTEND EDGE 45 (KITS) IMPLANT
CHANNEL WORK EXTEND EDGE 90 (KITS) IMPLANT
CLOTH BEACON ORANGE TIMEOUT ST (SAFETY) ×2 IMPLANT
CONT SPEC 4OZ CLIKSEAL STRL BL (MISCELLANEOUS) ×2 IMPLANT
COVER TABLE BACK 60X90 (DRAPES) ×2 IMPLANT
FILTER STRAW FLUID ASPIR (MISCELLANEOUS) IMPLANT
FORCEPS BIOP SUPERTRX PREMAR (INSTRUMENTS) IMPLANT
GLOVE BIO SURGEON STRL SZ 6.5 (GLOVE) ×2 IMPLANT
KIT LOCATABLE GUIDE (CANNULA) IMPLANT
KIT MARKER FIDUCIAL DELIVERY (KITS) IMPLANT
KIT PROCEDURE EDGE 180 (KITS) IMPLANT
KIT PROCEDURE EDGE 45 (KITS) IMPLANT
KIT PROCEDURE EDGE 90 (KITS) ×1 IMPLANT
KIT ROOM TURNOVER OR (KITS) ×2 IMPLANT
MARKER SKIN DUAL TIP RULER LAB (MISCELLANEOUS) ×2 IMPLANT
NDL SUPERTRX PREMARK BIOPSY (NEEDLE) IMPLANT
NEEDLE SUPERTRX PREMARK BIOPSY (NEEDLE) ×2 IMPLANT
NS IRRIG 1000ML POUR BTL (IV SOLUTION) ×2 IMPLANT
OIL SILICONE PENTAX (PARTS (SERVICE/REPAIRS)) ×2 IMPLANT
PAD ARMBOARD 7.5X6 YLW CONV (MISCELLANEOUS) ×4 IMPLANT
PATCHES PATIENT (LABEL) ×2 IMPLANT
SPONGE GAUZE 4X4 12PLY (GAUZE/BANDAGES/DRESSINGS) ×2 IMPLANT
SYR 20ML ECCENTRIC (SYRINGE) ×2 IMPLANT
SYR 30ML LL (SYRINGE) IMPLANT
SYRINGE 10CC LL (SYRINGE) ×1 IMPLANT
TOWEL OR 17X24 6PK STRL BLUE (TOWEL DISPOSABLE) ×2 IMPLANT
TRAP SPECIMEN MUCOUS 40CC (MISCELLANEOUS) ×2 IMPLANT
TUBE CONNECTING 12X1/4 (SUCTIONS) ×2 IMPLANT

## 2011-08-28 NOTE — Anesthesia Procedure Notes (Signed)
Procedure Name: Intubation Date/Time: 08/28/2011 7:49 AM Performed by: Rossie Muskrat L Pre-anesthesia Checklist: Patient identified, Timeout performed, Emergency Drugs available, Suction available and Patient being monitored Patient Re-evaluated:Patient Re-evaluated prior to inductionOxygen Delivery Method: Circle system utilized Preoxygenation: Pre-oxygenation with 100% oxygen Intubation Type: IV induction Ventilation: Mask ventilation without difficulty and Oral airway inserted - appropriate to patient size Laryngoscope Size: Miller and 2 Grade View: Grade I Tube type: Oral Tube size: 8.5 mm Number of attempts: 1 Airway Equipment and Method: Stylet Placement Confirmation: ETT inserted through vocal cords under direct vision,  breath sounds checked- equal and bilateral and positive ETCO2 Secured at: 18 cm Tube secured with: Tape Dental Injury: Teeth and Oropharynx as per pre-operative assessment

## 2011-08-28 NOTE — H&P (Signed)
[address]            [place] [address]          [phone]         [name] [facility] Medical Record #[id number] Date of Birth: 1930/10/18  Referring: No ref. 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  Her wt has been stable and porta cath and feeding tube were recently removed. Follow up scans including PET show rt upper lung mass, hypermetabloic. Patient is former smoker, quit in 1990     Current Activity/ Functional Status: Patient is independent with mobility/ambulation, transfers, ADL's, IADL's.   Past Medical History  Diagnosis Date  . 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 Dyslipidemia   . Arthritis     Hx of osteoarthritis  . Shortness of breath     WITH EXERTION   . COPD (chronic obstructive pulmonary disease)   . Anemia     Past Surgical History  Procedure Date  . Esophagogastrectomy, j tube 11/08/2010   . Cholecystectomy   . Abdominal hysterectomy   . Cholecystectomy   . Cataract extraction     RIGHT EYE   . Port-a-cath removal 05/13/2011    Procedure: REMOVAL PORT-A-CATH;  Surgeon: Almond Lint, MD;  Location: [facility] OR;  Service: General;  Laterality: N/A;  Removal port-a-cath.    Family History  Problem Relation Age of Onset  . Cancer Sister     unknown  History of treated TB in brother, but at time patient did not have contact with  the brother  History   Social History  . Marital Status: Widowed         Social History Main Topics  . Smoking status: Former Smoker -- 0.2 packs/day    Types: Cigarettes    Quit date: 05/16/1988  . Smokeless tobacco: Never Used  . Alcohol Use: No  . Drug Use: No  . Sexually Active: Not on file       History  Smoking status  . Former Smoker -- 0.2 packs/day  . Types: Cigarettes  . Quit date: 05/16/1988  Smokeless tobacco  . Never Used    History  Alcohol Use No     No Known Allergies  No current facility-administered medications for this encounter.   Current Outpatient Prescriptions  Medication Sig Dispense Refill  . albuterol (PROVENTIL HFA;VENTOLIN HFA) 108 (90 BASE) MCG/ACT inhaler Inhale 3 puffs into the lungs every 4 (four) hours as needed. For shortness of breath       . calcium-vitamin D (OSCAL WITH D) 500-200 MG-UNIT per tablet Take 1 tablet by mouth every other day.       . Cyanocobalamin (B-12) 100 MCG TABS Take 1 tablet by mouth as needed.       . Ferrous Sulfate (IRON) 325 (65 FE) MG TABS Take 1 tablet by mouth daily.       . fish oil-omega-3 fatty acids 1000 MG capsule Take  1 g by mouth daily.       . hydrOXYzine (ATARAX) 10 MG tablet Take 10 mg by mouth daily as needed. For itching.      . lovastatin (MEVACOR) 20 MG tablet Take 20 mg by mouth daily.        . Multiple Vitamins-Minerals (CENTRUM SILVER PO) Take 1 tablet by mouth daily.       . Multiple Vitamins-Minerals (ICAPS PO) Take 1 tablet by mouth every other day.            Review of Systems:     Cardiac Review of Systems: Y or N  Chest Pain [  n  ]  Resting SOB [ y  ] Exertional SOB  Cove.Etienne  ]  Orthopnea [  ]   Pedal Edema [   ]    Palpitations [  ] Syncope  [  ]   Presyncope [   ]  General Review of Systems: [Y] = yes [  ]=no Constitional: recent weight change [ n ]; anorexia [n  ]; fatigue Cove.Etienne  ]; nausea [n  ]; night sweats [n  ]; fever [ n ]; or chills [n  ];                                                                                                                                          Dental: poor dentition[  ];   Eye : blurred vision [  ]; diplopia [   ]; vision changes [ n ];  Amaurosis fugax[  ]; Resp: cough [  ];  wheezing[  ];  hemoptysis[  ]; shortness of breath[ y ]; paroxysmal  nocturnal dyspnea[n  ]; dyspnea on exertion[y  ]; or orthopnea[  ];  GI:  gallstones[  ], vomiting[  ];  dysphagia[  ]; melena[ n ];  hematochezia [n  ]; heartburn[  ];   Hx of  Colonoscopy[  ]; GU: kidney stones [  ]; hematuria[  ];   dysuria [  ];  nocturia[  ];  history of     obstruction [  ];             Skin: rash, swelling[  ];, hair loss[  ];  peripheral edema[  ];  or itching[  ]; Musculosketetal: myalgias[  ];  joint swelling[  ];  joint erythema[  ];  joint pain[  ];  back pain[  ];  Heme/Lymph: bruising[  ];  bleeding[  ];  anemia[  ];  Neuro: TIA[  ];  headaches[  ];  stroke[ n ];  vertigo[  ];  seizures[  ];   paresthesias[  ];  difficulty walking[  ];  Psych:depression[  ]; anxiety[  ];  Endocrine: diabetes[  ];  thyroid dysfunction[  ];  Immunizations: Flu [  ]; Pneumococcal[  ];  Other:  Physical Exam: There were no vitals taken  for this visit.  General appearance: alert, cooperative, appears older than stated age, cachectic and no distress Neurologic: intact Heart: regular rate and rhythm, S1, S2 normal, no murmur, click, rub or gallop, normal apical impulse and no rub Lungs: clear to auscultation bilaterally and normal percussion bilaterally Abdomen: soft, non-tender; bowel sounds normal; no masses,  no organomegaly and healed incision Extremities: extremities normal, atraumatic, no cyanosis or edema, Homans sign is negative, no sign of DVT, no ulcers, gangrene or trophic changes and palpable pedal pulses no carotid briuts No palpable adenopathy, cervical or supraclavicular fossa or axillary No change in her physical exams and she was examined last week  Diagnostic Studies & Laboratory data:    Nm Pet Image Initial (pi) Skull Base To Thigh  08/11/2011  *RADIOLOGY REPORT*  Clinical Data: Subsequent treatment strategy for gastric cancer.  NUCLEAR MEDICINE PET SKULL BASE TO THIGH  Fasting Blood Glucose:  85  Technique:  19.3 mCi F-18 FDG was injected intravenously. CT data  was obtained and used for attenuation correction and anatomic localization only.  (This was not acquired as a diagnostic CT examination.) Additional exam technical data entered on technologist worksheet.  Comparison:  Multiple priors, most recently PET CT dated 12/20/2010, and chest CT dated 07/23/2011.  Findings:  Neck: No hypermetabolic lymph nodes in the neck.  There is some symmetrically increased activity in the region of the tonsillar pillars bilaterally, favored be reactive as there is no soft tissue abnormality noted on the CT portion of the examination.  Chest: There has been some interval growth of what is now a 3.2 x 2.1 cm pleural based mass in the medial aspect of the right upper lobe apex which is hypermetabolic (SUVmax = 6.9).  Linear opacities in the medial segment of the right middle lobe and the inferior segment of the lingula likely reflect areas of atelectasis and/or scarring.  No additional definite suspicious-appearing pulmonary nodules or masses are otherwise noted.  There is a new small left- sided pleural effusion. Heart size is mildly enlarged. There is no significant pericardial fluid, thickening or pericardial calcification. There is atherosclerosis of the thoracic aorta, the great vessels of the mediastinum and the coronary arteries, including calcified atherosclerotic plaque in the left main, left anterior descending, left circumflex and right coronary arteries. No pathologically enlarged or hypermetabolic mediastinal or hilar lymph nodes.    A nonenlarged (8 mm short axis) left axillary lymph node is noted that demonstrates some low level increased metabolic activity (SUVmax = 2.3), which is similar in retrospect compared to prior PET CT 12/20/2010, and highly nonspecific, presumably reactive.  Abdomen/Pelvis:  No abnormal hypermetabolic activity within the liver, pancreas, adrenal glands, or spleen.  No hypermetabolic lymph nodes in the abdomen or pelvis.  Postoperative changes of  partial gastrectomy near the GE junction are noted.  Status post cholecystectomy.  Soft tissue in the region of the pancreaticoduodenal groove is indistinct, and there is stranding throughout the peripancreatic retroperitoneal soft tissues.  Mild bilateral perinephric stranding is also noted.  Low attenuation is noted throughout the second and third segments of the liver, likely to represent focal fatty infiltration atherosclerosis of the abdominal and pelvic vasculature, without focal aneurysm.  Small volume of ascites layering within the cul-de-sac.  Status post hysterectomy. Previously noted jejunostomy tube has been removed and there is a healed tract in the anterior abdominal wall overlying left upper quadrant of the abdomen.  [place]:  No focal hypermetabolic activity to suggest skeletal metastasis.  IMPRESSION: 1.  3.2 x 2.1 cm pleural based hypermetabolic mass in the medial aspect of the right lung apex is consistent with malignancy. Whether not this represents a solitary pulmonary metastasis (this would be an unusual location for metastatic lesion), or a primary lung lesion is uncertain. 2.  Nonenlarged 8 mm left axillary lymph node with low-level increased metabolic activity, similar to prior study.  This finding is nonspecific and presumably reactive, however, continued attention on follow-up studies is recommended. 3.  Poor definition of soft tissues in the pancreaticoduodenal groove with stranding throughout the peripancreatic fat of the retroperitoneum.  Findings are nonspecific, but can be seen in the setting of acute pancreatitis.  Clinical correlation is recommended. 3.  There is also a small volume of ascites layering within the cul- de-sac. 4.  Focal fatty infiltration in segments two and three in the liver. 5. Atherosclerosis, including left main and three-vessel coronary artery disease. Please note that although the presence of coronary artery calcium documents the presence of coronary artery  disease, the severity of this disease and any potential stenosis cannot be assessed on this non-gated CT examination.  Assessment for potential risk factor modification, dietary therapy or pharmacologic therapy may be warranted, if clinically indicated. 6.  Additional postoperative changes, as above.  These results will be called to the ordering clinician or representative by the Radiologist Assistant, and communication documented in the PACS Dashboard.  Original Report Authenticated By: Florencia Reasons, M.D.    CT CHEST WITH CONTRAST   Technique:  Multidetector CT imaging of the chest was performed following the standard protocol during bolus administration of intravenous contrast.   Contrast: 80mL OMNIPAQUE IOHEXOL 300 MG/ML IJ SOLN   Comparison: CT of the abdomen and pelvis 06/12/2011.  CT of chest 05/30/2010.   Findings:   Mediastinum: Heart size is normal. There is no significant pericardial fluid, thickening or pericardial calcification.   The esophagus is patulous and fluid-filled. No pathologically enlarged mediastinal or hilar lymph nodes.   There is atherosclerosis of the thoracic aorta, the great vessels of the mediastinum and the coronary arteries, including calcified atherosclerotic plaque in the left anterior descending, left circumflex and right coronary arteries.   Lungs/Pleura: Within the medial aspect of the apex of the right upper lobe there is a new 2.8 x 1.9 cm mass which abuts the mediastinal pleura, without definitive evidence to suggest mediastinal invasion at this time.  No additional suspicious appearing pulmonary nodules or masses are otherwise identified. However, there are some new opacities in the medial aspects of the lung bases bilaterally which are favored to represent areas of scarring (however, some neoplasms such as gastric adenocarcinoma can have a very infiltrative and ill-defined appearance with their metastases).  There is a background of  moderate centrilobular emphysema.  Some faint subpleural reticulation is scattered throughout the periphery of the lungs bilaterally, similar compared to the prior examination, without frank honeycombing or traction bronchiectasis to strongly suggest the presence of an underlying interstitial lung disease.  No pleural effusions.   Upper Abdomen: Postsurgical changes likely from prior partial gastrectomy are noted.  A surgical clip is noted in the gallbladder fossa consistent with prior history of cholecystectomy.   Musculoskeletal: There are no aggressive appearing lytic or blastic lesions noted in the visualized portions of the skeleton.   IMPRESSION: 1.  Interval development of a 2.8 x 1.9 cm macrolobulated nodule in the medial aspect of the right upper lobe that abuts the pleura overlying the mediastinum (without definitive evidence of mediastinal  invasion at this time).  This finding is concerning for potential solitary metastasis or primary bronchogenic neoplasm, although this may less likely represent a focus of inflammation/infection.  No other definite evidence of metastatic disease is otherwise noted.  2. Atherosclerosis, including three-vessel coronary artery disease. Please note that although the presence of coronary artery calcium documents the presence of coronary artery disease, the severity of this disease and any potential stenosis cannot be assessed on this non-gated CT examination.  Assessment for potential risk factor modification, dietary therapy or pharmacologic therapy may be warranted, if clinically indicated. 3. Background of moderate centrilobular emphysema again noted.         Recent Lab Findings: Lab Results  Component Value Date   WBC 4.9 08/26/2011   HGB 12.5 08/26/2011   HCT 38.2 08/26/2011   PLT 202 08/26/2011   GLUCOSE 88 08/26/2011   ALT 18 08/26/2011   AST 33 08/26/2011   NA 143 08/26/2011   K 3.8 08/26/2011   CL 107 08/26/2011   CREATININE  0.64 08/26/2011   BUN 9 08/26/2011   CO2 27 08/26/2011   INR 0.95 08/26/2011      Assessment / Plan:   1,New enlarging rt upper lobe lung lesion in patient with recently resected pT4N2 adeno CA of cardia, unclear if MET or new primary lung CA 2, COPD, PFT done but have been unable to get results yet will call tomorrow 3, No history of Coronary Disease but CT evidence of calcified left main. She tolerated recent gastrectomy without difficulty.  I have reviewed the films and discussed with the patient the findings,she has limited pulmonary reserve by history. I discussed with patient proceeding with bronchoscopy and ENB to obtain a possible tissue diagnosis. It is not clear if the current lesion is related to a previous gastric carcinoma, a new lung primary, or an inflammatory mass developing while on chemotherapy.  The goals risks and alternatives of the planned surgical procedure Bronch ENB have been discussed with the patient in detail. The risks of the procedure including death, infection, stroke, myocardial infarction, bleeding, blood transfusion have all been discussed specifically.  I have quoted [name] a 1 % of perioperative mortality and a complication rate as high as 10%. The patient's questions have been answered.[name] is willing  to proceed with the planned procedure. We'll plan to proceed today    [name] MD  Beeper [phone] Office [phone] 08/28/2011 6:49 AM

## 2011-08-28 NOTE — Brief Op Note (Addendum)
08/28/2011  9:46 AM  PATIENT:  [name]  78 y.o. female  PRE-OPERATIVE DIAGNOSIS: Rt upper lobe lung Mass POST-OPERATIVE DIAGNOSIS:  Pending path  PROCEDURE:  Procedure(s) (LRB): VIDEO BRONCHOSCOPY WITH ENDOBRONCHIAL NAVIGATION (N/A) And lung bx SURGEON:  Surgeon(s) and Role:    * [name], MD - Primary    ASSISTANTS: Dr Edwyna Shell  ANESTHESIA:   general  EBL:  Total I/O In: 1000 [I.V.:1000] Out: -   BLOOD ADMINISTERED:none  DRAINS: none     SPECIMEN:  Source of Specimen:  rt upper lobe mass  DISPOSITION OF SPECIMEN:  PATHOLOGY  COUNTS:  YES   DICTATION: .Other Dictation: Dictation Number [id number]  PLAN OF CARE: Discharge to home after PACU  PATIENT DISPOSITION:  PACU - hemodynamically stable.

## 2011-08-28 NOTE — Anesthesia Preprocedure Evaluation (Signed)
Anesthesia Evaluation  Patient identified by MRN, date of birth, ID band Patient awake    Reviewed: Allergy & Precautions, H&P , NPO status , Patient's Chart, lab work & pertinent test results  Airway Mallampati: II      Dental  (+) Edentulous Upper and Edentulous Lower   Pulmonary  breath sounds clear to auscultation        Cardiovascular Rhythm:Regular Rate:Normal     Neuro/Psych    GI/Hepatic   Endo/Other    Renal/GU      Musculoskeletal   Abdominal   Peds  Hematology   Anesthesia Other Findings   Reproductive/Obstetrics                           Anesthesia Physical Anesthesia Plan  ASA: III  Anesthesia Plan: General   Post-op Pain Management:    Induction: Intravenous  Airway Management Planned: Oral ETT  Additional Equipment:   Intra-op Plan:   Post-operative Plan: Extubation in OR  Informed Consent: I have reviewed the patients History and Physical, chart, labs and discussed the procedure including the risks, benefits and alternatives for the proposed anesthesia with the patient or authorized representative who has indicated his/her understanding and acceptance.     Plan Discussed with: CRNA and Surgeon  Anesthesia Plan Comments:         Anesthesia Quick Evaluation

## 2011-08-28 NOTE — Transfer of Care (Signed)
Immediate Anesthesia Transfer of Care Note  Patient: [name]  Procedure(s) Performed: Procedure(s) (LRB): VIDEO BRONCHOSCOPY WITH ENDOBRONCHIAL NAVIGATION (N/A)  Patient Location: PACU  Anesthesia Type: General  Level of Consciousness: awake and alert   Airway & Oxygen Therapy: Patient Spontanous Breathing and Patient connected to face mask oxygen  Post-op Assessment: Report given to PACU RN, Post -op Vital signs reviewed and stable and Patient moving all extremities  Post vital signs: reviewed & stable  Complications: No apparent anesthesia complications

## 2011-08-28 NOTE — Op Note (Addendum)
NAMEDONNESHA, [name]             ACCOUNT NO.:  [id number]  MEDICAL RECORD NO.:  [id number]  LOCATION:  [facility]                         FACILITY:  [facility]  PHYSICIAN:  Sheliah Plane, MD    DATE OF BIRTH:  1930-07-02  DATE OF PROCEDURE:  08/27/2011 DATE OF DISCHARGE:  08/27/2011                              OPERATIVE REPORT   PREOPERATIVE DIAGNOSIS:  History of advanced stage gastric cancer, treated with surgery, radiation and chemotherapy with new right upper lobe lung lesion.  POSTOPERATIVE DIAGNOSIS:  History of advanced stage gastric cancer, treated with surgery, radiation and chemotherapy with new right upper lobe lung lesion.  Path pending and cultures pending.  SURGICAL PROCEDURE:  Bronchoscopy and electromagnetic navigation bronchoscopy with needle biopsy brushings and bronchoalveolar lavage.  SURGEON:  Sheliah Plane, MD  FIRST ASSISTANT:  Ines Bloomer, MD  BRIEF HISTORY:  The patient is a 78 year old female who had a T4, N2 adenocarcinoma of the cardia of the stomach resected approximately 8 months ago.  In followup scans, she noted to have a 2-cm mass medially in the right upper lobe.  There was hypermetabolic.  It was unclear if this mass is a primary lung cancer, metastatic disease from the gastric carcinoma, or perhaps an inflammatory mass.  Because of the location, it was difficult to approach in any fashion, but endobronchial bronchoscopy navigation was selected as the most likely to obtain a tissue diagnosis. Risks and options were discussed with the patient.  She was agreeable and signed informed consent.  DESCRIPTION OF PROCEDURE:  After appropriate time-out and confirmation of the patient's identity, she underwent general endotracheal anesthesia without incident.  The ENB plates and monitors were placed and calibrated appropriately.  Initially, we did a survey bronchoscopy to the subsegmental level, both on the left and right tracheobronchial  tree without any obvious endobronchial lesions.  Bronchial washings for cytology and culture were obtained at this point.  The 180-degree edge catheter was then placed through the bronchoscope registration with the ENB device was carried out.  We then navigated the device and with assistance of fluoroscopy to within 1.2-1.5 cm of the right upper lobe mass for several passes with a needle.  Cytology and aspirating needles were carried out fluoroscopically.  Then, brushes were obtained. Approximately 8 biopsies with small biopsy forceps were obtained.  In between each several biopsies, the LG device was replaced to confirm position of the working channel.  Finally, a needle brush was passed several times and some of this material was submitted for microbiology studies including fungal.  At the completion of BAL of the lesion area in question was obtained with 10 mL of saline aspirated twice.  Initial cytologic examination of first specimens showed reactive changes, but no definite malignancy.  We will wait for the additional material before making any treatment changes.  At the completion of the procedure, fluoroscopy did not demonstrate any obvious pneumothorax. The patient was extubated and transferred to the recovery room having tolerated the procedure without obvious complication.  We will obtain a postop chest x-ray in the recovery room.     Sheliah Plane, MD     EG/MEDQ  D:  08/28/2011  T:  08/28/2011  Job:  [id number]  cc:   Dr. Purvis Sheffield

## 2011-08-28 NOTE — Discharge Instructions (Signed)
Bronchoscopy Care After These instructions give you information on caring for yourself after your procedure. Your doctor may also give you specific instructions. Call your doctor if you have any problems or questions after your procedure. HOME CARE  Do not eat or drink anything for 2 hours after your test. Your nose and throat was numbed by medicine. If you try to eat or drink before the medicine wears off, food or drink could go into your lungs.   For the rest of the first day, eat soft food and drink liquids slowly.   On the day after the test, you can go back to eating your usual food.   Do not drive or sign important papers the day of the test.   Take it easy for the next 2 days. Do not do any heavy work, exercise, or activities.   Only take medicine as told by your doctor. Do not take aspirin.   You may be drowsy for the next 24 hours.   You may see traces of blood in your spit for 1 to 2 days.  Finding out the results of your test Ask when your test results will be ready. Make sure you get your test results. GET HELP RIGHT AWAY IF:  You have breathing problems.   You have a bad sore throat for more than 1 week.   You see traces of blood in your spit for more than 3 days.   You start coughing up blood.   You have a temperature of 102 F (38.9 C) or higher.  MAKE SURE YOU:  Understand these instructions.   Will watch your condition.   Will get help right away if you are not doing well or get worse.  Document Released: 02/16/2009 Document Revised: 04/10/2011 Document Reviewed: 02/16/2009 [employer] Patient Information 2012 [employer]. 

## 2011-08-28 NOTE — Preoperative (Signed)
Beta Blockers   Reason not to administer Beta Blockers:Not Applicable 

## 2011-08-28 NOTE — Anesthesia Postprocedure Evaluation (Signed)
  Anesthesia Post-op Note  Patient: [name]  Procedure(s) Performed: Procedure(s) (LRB): VIDEO BRONCHOSCOPY WITH ENDOBRONCHIAL NAVIGATION (N/A)  Patient Location: PACU  Anesthesia Type: General  Level of Consciousness: awake  Airway and Oxygen Therapy: Patient Spontanous Breathing  Post-op Pain: mild  Post-op Assessment: Post-op Vital signs reviewed, Patient's Cardiovascular Status Stable, Respiratory Function Stable, Patent Airway, No signs of Nausea or vomiting, Pain level controlled and No headache  Post-op Vital Signs: Reviewed and stable  Complications: No apparent anesthesia complications

## 2011-08-29 LAB — FUNGAL STAIN: Fungal Smear: NONE SEEN

## 2011-08-30 LAB — CULTURE, RESPIRATORY W GRAM STAIN
Culture: NO GROWTH
Gram Stain: NONE SEEN

## 2011-08-31 LAB — CULTURE, RESPIRATORY W GRAM STAIN: Gram Stain: NONE SEEN

## 2011-09-05 ENCOUNTER — Telehealth: Payer: Self-pay

## 2011-09-05 ENCOUNTER — Encounter: Payer: Medicare Other | Admitting: Cardiothoracic Surgery

## 2011-09-05 DIAGNOSIS — R918 Other nonspecific abnormal finding of lung field: Secondary | ICD-10-CM

## 2011-09-05 NOTE — Telephone Encounter (Signed)
Message copied by Donata Duff on Fri Sep 05, 2011  8:25 AM ------      Message from: Rachael Fee      Created: Fri Sep 05, 2011  7:41 AM       Ed,            The mass directly abuts the esophagus, which is good.  It is pretty high though (window would be from proximal esophagus) which makes it more technically difficult, but certainly worth a try by EUS.  If you can give her a heads up sometime this morning, I'll have my office get in touch with her to schedule (probably sometime next week).            Thanks                  Perla Echavarria,      Can you get in touch with her later today.  She needs upper EUS, ++MAC at [facility] (next week sometime??, the following week at the latest). For RUL lung mass.  Radial +/- linear, 60 min.  Thanks            Dan                  ----- Message -----         From: [name], MD         Sent: 09/04/2011   2:57 PM           To: Rachael Fee, MD            Jesusita Oka, Do  you think you can bx this right upper lobe mass with esophageal [place]. I have tried with ENB vis bronchus and did not get diagnostic tissue. She has history of gastric cancer, so I am not sure if the lesion is met or primary lung ca or inflammatory.      Thanks Ed Tyrone Sage

## 2011-09-08 ENCOUNTER — Ambulatory Visit (INDEPENDENT_AMBULATORY_CARE_PROVIDER_SITE_OTHER): Payer: Medicare Other | Admitting: Cardiothoracic Surgery

## 2011-09-08 ENCOUNTER — Other Ambulatory Visit: Payer: Self-pay

## 2011-09-08 ENCOUNTER — Encounter: Payer: Self-pay | Admitting: Cardiothoracic Surgery

## 2011-09-08 VITALS — BP 145/85 | HR 81 | Resp 16 | Ht 59.0 in | Wt 118.0 lb

## 2011-09-08 DIAGNOSIS — R918 Other nonspecific abnormal finding of lung field: Secondary | ICD-10-CM

## 2011-09-08 DIAGNOSIS — R222 Localized swelling, mass and lump, trunk: Secondary | ICD-10-CM

## 2011-09-08 DIAGNOSIS — C160 Malignant neoplasm of cardia: Secondary | ICD-10-CM

## 2011-09-08 NOTE — Progress Notes (Signed)
[address]            [place] [address]          [phone]         [name] [facility] Medical Record #[id number] Date of Birth: 02/08/1931  Referring: Laurice Record, MD Primary Care: [name], MD, MD  Chief Complaint:    Chief Complaint  Patient presents with  . Routine Post Op    to discuss FOB/ENB 08/28/11  . 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  Her wt has been stable and porta cath and feeding tube were recently removed. Follow up scans including PET show rt upper lung mass, hypermetabloic. Patient is former smoker, quit in 1990  Bronchoscopy with ENB was performed but no confirmed cancer was noted on the pathology final review. The cultures and smears also revealed no definitive diagnosis.   Current Activity/ Functional Status: Patient is independent with mobility/ambulation, transfers, ADL's, IADL's.   Past Medical History  Diagnosis Date  . 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 Dyslipidemia   . Arthritis     Hx of osteoarthritis  . Shortness of breath     WITH EXERTION   . COPD (chronic obstructive pulmonary disease)   . Anemia     Past Surgical History  Procedure Date  . Esophagogastrectomy, j tube 11/08/2010   . Cholecystectomy   . Abdominal hysterectomy   . Cholecystectomy   . Cataract extraction     RIGHT EYE   . Port-a-cath removal 05/13/2011    Procedure: REMOVAL PORT-A-CATH;  Surgeon: Almond Lint, MD;  Location: [facility] OR;  Service: General;  Laterality: N/A;  Removal port-a-cath.    Family History  Problem Relation Age of Onset  . Cancer Sister     unknown  History of treated TB in brother, but at time patient did not have contact with  the brother  History   Social History  . Marital Status: Widowed         Social History Main Topics   . Smoking status: Former Smoker -- 0.2 packs/day    Types: Cigarettes    Quit date: 05/16/1988  . Smokeless tobacco: Never Used  . Alcohol Use: No  . Drug Use: No  . Sexually Active: Not on file      History  Smoking status  . Former Smoker -- 0.2 packs/day  . Types: Cigarettes  . Quit date: 05/16/1988  Smokeless tobacco  . Never Used    History  Alcohol Use No     No Known Allergies  Current Outpatient Prescriptions  Medication Sig Dispense Refill  . albuterol (PROVENTIL HFA;VENTOLIN HFA) 108 (90 BASE) MCG/ACT inhaler Inhale 3 puffs into the lungs every 4 (four) hours as needed. For shortness of breath       . calcium-vitamin D (OSCAL WITH D) 500-200 MG-UNIT per tablet Take 1 tablet by mouth every other day.       . Cyanocobalamin (B-12) 100 MCG TABS Take 1 tablet by mouth as needed.       . Ferrous Sulfate (  IRON) 325 (65 FE) MG TABS Take 1 tablet by mouth daily.       . fish oil-omega-3 fatty acids 1000 MG capsule Take 1 g by mouth daily.       . hydrOXYzine (ATARAX) 10 MG tablet Take 10 mg by mouth daily as needed. For itching.      . lovastatin (MEVACOR) 20 MG tablet Take 20 mg by mouth daily.        . Multiple Vitamins-Minerals (CENTRUM SILVER PO) Take 1 tablet by mouth daily.       . Multiple Vitamins-Minerals (ICAPS PO) Take 1 tablet by mouth every other day.            Review of Systems:     Cardiac Review of Systems: Y or N  Chest Pain [  n  ]  Resting SOB [ y  ] Exertional SOB  Cove.Etienne  ]  Orthopnea [  ]   Pedal Edema [   ]    Palpitations [  ] Syncope  [  ]   Presyncope [   ]  General Review of Systems: [Y] = yes [  ]=no Constitional: recent weight change [ n ]; anorexia [n  ]; fatigue Cove.Etienne  ]; nausea [n  ]; night sweats [n  ]; fever [ n ]; or chills [n  ];                                                                                                                                          Dental: poor dentition[  ];   Eye : blurred vision [  ]; diplopia [    ]; vision changes [ n ];  Amaurosis fugax[  ]; Resp: cough [  ];  wheezing[  ];  hemoptysis[  ]; shortness of breath[ y ]; paroxysmal nocturnal dyspnea[n  ]; dyspnea on exertion[y  ]; or orthopnea[  ];  GI:  gallstones[  ], vomiting[  ];  dysphagia[  ]; melena[ n ];  hematochezia [n  ]; heartburn[  ];   Hx of  Colonoscopy[  ]; GU: kidney stones [  ]; hematuria[  ];   dysuria [  ];  nocturia[  ];  history of     obstruction [  ];             Skin: rash, swelling[  ];, hair loss[  ];  peripheral edema[  ];  or itching[  ]; Musculosketetal: myalgias[  ];  joint swelling[  ];  joint erythema[  ];  joint pain[  ];  back pain[  ];  Heme/Lymph: bruising[  ];  bleeding[  ];  anemia[  ];  Neuro: TIA[  ];  headaches[  ];  stroke[ n ];  vertigo[  ];  seizures[  ];   paresthesias[  ];  difficulty walking[  ];  Psych:depression[  ]; anxiety[  ];  Endocrine:  diabetes[  ];  thyroid dysfunction[  ];  Immunizations: Flu [  ]; Pneumococcal[  ];  Other:  Physical Exam: BP 145/85  Pulse 81  Resp 16  Ht 4\' 11"  (1.499 m)  Wt 118 lb (53.524 kg)  BMI 23.83 kg/m2  SpO2 92%  General appearance: alert, cooperative, appears older than stated age, cachectic and no distress Neurologic: intact Heart: regular rate and rhythm, S1, S2 normal, no murmur, click, rub or gallop, normal apical impulse and no rub Lungs: clear to auscultation bilaterally and normal percussion bilaterally Abdomen: soft, non-tender; bowel sounds normal; no masses,  no organomegaly and healed incision Extremities: extremities normal, atraumatic, no cyanosis or edema, Homans sign is negative, no sign of DVT, no ulcers, gangrene or trophic changes and palpable pedal pulses no carotid briuts No palpable adenopathy, cervical or supraclavicular fossa or axillary, there is a node noted on CT scan in the left axilla but this is not palpable No change in her physical exams and she was examined last week  Diagnostic Studies & Laboratory data:    Nm  Pet Image Initial (pi) Skull Base To Thigh  08/11/2011  *RADIOLOGY REPORT*  Clinical Data: Subsequent treatment strategy for gastric cancer.  NUCLEAR MEDICINE PET SKULL BASE TO THIGH  Fasting Blood Glucose:  85  Technique:  19.3 mCi F-18 FDG was injected intravenously. CT data was obtained and used for attenuation correction and anatomic localization only.  (This was not acquired as a diagnostic CT examination.) Additional exam technical data entered on technologist worksheet.  Comparison:  Multiple priors, most recently PET CT dated 12/20/2010, and chest CT dated 07/23/2011.  Findings:  Neck: No hypermetabolic lymph nodes in the neck.  There is some symmetrically increased activity in the region of the tonsillar pillars bilaterally, favored be reactive as there is no soft tissue abnormality noted on the CT portion of the examination.  Chest: There has been some interval growth of what is now a 3.2 x 2.1 cm pleural based mass in the medial aspect of the right upper lobe apex which is hypermetabolic (SUVmax = 6.9).  Linear opacities in the medial segment of the right middle lobe and the inferior segment of the lingula likely reflect areas of atelectasis and/or scarring.  No additional definite suspicious-appearing pulmonary nodules or masses are otherwise noted.  There is a new small left- sided pleural effusion. Heart size is mildly enlarged. There is no significant pericardial fluid, thickening or pericardial calcification. There is atherosclerosis of the thoracic aorta, the great vessels of the mediastinum and the coronary arteries, including calcified atherosclerotic plaque in the left main, left anterior descending, left circumflex and right coronary arteries. No pathologically enlarged or hypermetabolic mediastinal or hilar lymph nodes.    A nonenlarged (8 mm short axis) left axillary lymph node is noted that demonstrates some low level increased metabolic activity (SUVmax = 2.3), which is similar in retrospect  compared to prior PET CT 12/20/2010, and highly nonspecific, presumably reactive.  Abdomen/Pelvis:  No abnormal hypermetabolic activity within the liver, pancreas, adrenal glands, or spleen.  No hypermetabolic lymph nodes in the abdomen or pelvis.  Postoperative changes of partial gastrectomy near the GE junction are noted.  Status post cholecystectomy.  Soft tissue in the region of the pancreaticoduodenal groove is indistinct, and there is stranding throughout the peripancreatic retroperitoneal soft tissues.  Mild bilateral perinephric stranding is also noted.  Low attenuation is noted throughout the second and third segments of the liver, likely to represent focal fatty infiltration  atherosclerosis of the abdominal and pelvic vasculature, without focal aneurysm.  Small volume of ascites layering within the cul-de-sac.  Status post hysterectomy. Previously noted jejunostomy tube has been removed and there is a healed tract in the anterior abdominal wall overlying left upper quadrant of the abdomen.  [place]:  No focal hypermetabolic activity to suggest skeletal metastasis.  IMPRESSION: 1.  3.2 x 2.1 cm pleural based hypermetabolic mass in the medial aspect of the right lung apex is consistent with malignancy. Whether not this represents a solitary pulmonary metastasis (this would be an unusual location for metastatic lesion), or a primary lung lesion is uncertain. 2.  Nonenlarged 8 mm left axillary lymph node with low-level increased metabolic activity, similar to prior study.  This finding is nonspecific and presumably reactive, however, continued attention on follow-up studies is recommended. 3.  Poor definition of soft tissues in the pancreaticoduodenal groove with stranding throughout the peripancreatic fat of the retroperitoneum.  Findings are nonspecific, but can be seen in the setting of acute pancreatitis.  Clinical correlation is recommended. 3.  There is also a small volume of ascites layering within the  cul- de-sac. 4.  Focal fatty infiltration in segments two and three in the liver. 5. Atherosclerosis, including left main and three-vessel coronary artery disease. Please note that although the presence of coronary artery calcium documents the presence of coronary artery disease, the severity of this disease and any potential stenosis cannot be assessed on this non-gated CT examination.  Assessment for potential risk factor modification, dietary therapy or pharmacologic therapy may be warranted, if clinically indicated. 6.  Additional postoperative changes, as above.  These results will be called to the ordering clinician or representative by the Radiologist Assistant, and communication documented in the PACS Dashboard.  Original Report Authenticated By: Florencia Reasons, M.D.    CT CHEST WITH CONTRAST   Technique:  Multidetector CT imaging of the chest was performed following the standard protocol during bolus administration of intravenous contrast.   Contrast: 80mL OMNIPAQUE IOHEXOL 300 MG/ML IJ SOLN   Comparison: CT of the abdomen and pelvis 06/12/2011.  CT of chest 05/30/2010.   Findings:   Mediastinum: Heart size is normal. There is no significant pericardial fluid, thickening or pericardial calcification.   The esophagus is patulous and fluid-filled. No pathologically enlarged mediastinal or hilar lymph nodes.   There is atherosclerosis of the thoracic aorta, the great vessels of the mediastinum and the coronary arteries, including calcified atherosclerotic plaque in the left anterior descending, left circumflex and right coronary arteries.   Lungs/Pleura: Within the medial aspect of the apex of the right upper lobe there is a new 2.8 x 1.9 cm mass which abuts the mediastinal pleura, without definitive evidence to suggest mediastinal invasion at this time.  No additional suspicious appearing pulmonary nodules or masses are otherwise identified. However, there are some new  opacities in the medial aspects of the lung bases bilaterally which are favored to represent areas of scarring (however, some neoplasms such as gastric adenocarcinoma can have a very infiltrative and ill-defined appearance with their metastases).  There is a background of moderate centrilobular emphysema.  Some faint subpleural reticulation is scattered throughout the periphery of the lungs bilaterally, similar compared to the prior examination, without frank honeycombing or traction bronchiectasis to strongly suggest the presence of an underlying interstitial lung disease.  No pleural effusions.   Upper Abdomen: Postsurgical changes likely from prior partial gastrectomy are noted.  A surgical clip is noted in the gallbladder  fossa consistent with prior history of cholecystectomy.   Musculoskeletal: There are no aggressive appearing lytic or blastic lesions noted in the visualized portions of the skeleton.   IMPRESSION: 1.  Interval development of a 2.8 x 1.9 cm macrolobulated nodule in the medial aspect of the right upper lobe that abuts the pleura overlying the mediastinum (without definitive evidence of mediastinal invasion at this time).  This finding is concerning for potential solitary metastasis or primary bronchogenic neoplasm, although this may less likely represent a focus of inflammation/infection.  No other definite evidence of metastatic disease is otherwise noted.  2. Atherosclerosis, including three-vessel coronary artery disease. Please note that although the presence of coronary artery calcium documents the presence of coronary artery disease, the severity of this disease and any potential stenosis cannot be assessed on this non-gated CT examination.  Assessment for potential risk factor modification, dietary therapy or pharmacologic therapy may be warranted, if clinically indicated. 3. Background of moderate centrilobular emphysema again noted.         Recent  Lab Findings: Lab Results  Component Value Date   WBC 4.9 08/26/2011   HGB 12.5 08/26/2011   HCT 38.2 08/26/2011   PLT 202 08/26/2011   GLUCOSE 88 08/26/2011   ALT 18 08/26/2011   AST 33 08/26/2011   NA 143 08/26/2011   K 3.8 08/26/2011   CL 107 08/26/2011   CREATININE 0.64 08/26/2011   BUN 9 08/26/2011   CO2 27 08/26/2011   INR 0.95 08/26/2011      Assessment / Plan:   1,New enlarging rt upper lobe lung lesion in patient with recently resected pT4N2 adeno CA of cardia, unclear if MET or new primary lung CA 2, No history of Coronary Disease but CT evidence of calcified left main. She tolerated recent gastrectomy without difficulty. 3' negative path from ENB  Plan: Dr. Christella Hartigan has reviewed the CT/pet and is willing to attempt esophageal ultrasound and needle biopsy. I discussed options with the patient and her granddaughter in detail. The medial position of the lesion makes percutaneous biopsy difficult. After discussing video-assisted thoracoscopy and wedge resection of the lesion versus further attempts at needle biopsy the patient has decided to proceed with definitive biopsy and wedge resection of lesion. She understands that if this is a metastatic deposit or wedge resection would not be curative.  We'll tentatively plan to go ahead may 13 without  further attempts to do needle bx     [name] MD  Beeper [phone] Office [phone] 09/08/2011 5:30 PM

## 2011-09-08 NOTE — Telephone Encounter (Signed)
Endo called back and ok'd the appt for 09/18/11 2 pm the pt has been instructed and meds reviewed.  Instructions have been mailed to the pt home

## 2011-09-08 NOTE — Telephone Encounter (Signed)
The pt was scheduled for 09/18/11 at [facility] but they called back and said that day would not work.  They are working on a day at lunch and will call back

## 2011-09-09 ENCOUNTER — Encounter (HOSPITAL_COMMUNITY): Payer: Self-pay | Admitting: Pharmacy Technician

## 2011-09-09 ENCOUNTER — Other Ambulatory Visit: Payer: Self-pay

## 2011-09-09 DIAGNOSIS — D381 Neoplasm of uncertain behavior of trachea, bronchus and lung: Secondary | ICD-10-CM

## 2011-09-10 ENCOUNTER — Encounter: Payer: Self-pay | Admitting: *Deleted

## 2011-09-12 ENCOUNTER — Encounter (HOSPITAL_COMMUNITY)
Admission: RE | Admit: 2011-09-12 | Discharge: 2011-09-12 | Disposition: A | Discharge status: Home / Self Care | Payer: Medicare Other | Source: Ambulatory Visit | Attending: Cardiothoracic Surgery | Admitting: Cardiothoracic Surgery

## 2011-09-12 ENCOUNTER — Encounter (HOSPITAL_COMMUNITY)
Admission: RE | Admit: 2011-09-12 | Discharge: 2011-09-12 | Disposition: A | Discharge status: Home / Self Care | Payer: Medicare Other | Source: Ambulatory Visit | Attending: Anesthesiology | Admitting: Anesthesiology

## 2011-09-12 ENCOUNTER — Encounter (HOSPITAL_COMMUNITY): Payer: Self-pay

## 2011-09-12 VITALS — BP 102/65 | HR 87 | Temp 97.5°F | Resp 18 | Ht 59.0 in | Wt 112.2 lb

## 2011-09-12 DIAGNOSIS — D381 Neoplasm of uncertain behavior of trachea, bronchus and lung: Secondary | ICD-10-CM

## 2011-09-12 HISTORY — DX: Other nonspecific abnormal finding of lung field: R91.8

## 2011-09-12 LAB — URINALYSIS, ROUTINE W REFLEX MICROSCOPIC
Bilirubin Urine: NEGATIVE
Glucose, UA: NEGATIVE mg/dL
Hgb urine dipstick: NEGATIVE
Ketones, ur: NEGATIVE mg/dL
Nitrite: NEGATIVE
Protein, ur: NEGATIVE mg/dL
Specific Gravity, Urine: 1.021 (ref 1.005–1.030)
Urobilinogen, UA: 0.2 mg/dL (ref 0.0–1.0)
pH: 5 (ref 5.0–8.0)

## 2011-09-12 LAB — TYPE AND SCREEN
ABO/RH(D): O POS
Antibody Screen: NEGATIVE

## 2011-09-12 LAB — COMPREHENSIVE METABOLIC PANEL
ALT: 25 U/L (ref 0–35)
AST: 30 U/L (ref 0–37)
Albumin: 3.4 g/dL — ABNORMAL LOW (ref 3.5–5.2)
Alkaline Phosphatase: 110 U/L (ref 39–117)
BUN: 11 mg/dL (ref 6–23)
CO2: 18 mEq/L — ABNORMAL LOW (ref 19–32)
Calcium: 9.5 mg/dL (ref 8.4–10.5)
Chloride: 110 mEq/L (ref 96–112)
Creatinine, Ser: 0.63 mg/dL (ref 0.50–1.10)
GFR calc Af Amer: >90 mL/min (ref >90)
GFR calc non Af Amer: 84 mL/min — ABNORMAL LOW (ref >90)
Glucose, Bld: 89 mg/dL (ref 70–99)
Potassium: 4.3 mEq/L (ref 3.5–5.1)
Sodium: 139 mEq/L (ref 135–145)
Total Bilirubin: 0.3 mg/dL (ref 0.3–1.2)
Total Protein: 6.9 g/dL (ref 6.0–8.3)

## 2011-09-12 LAB — BLOOD GAS, ARTERIAL
Acid-base deficit: 2.9 mmol/L — ABNORMAL HIGH (ref 0.0–2.0)
Bicarbonate: 20.9 mEq/L (ref 20.0–24.0)
Drawn by: 206361
FIO2: 0.21 %
O2 Saturation: 94.2 %
Patient temperature: 98.6
TCO2: 21.9 mmol/L (ref 0–100)
pCO2 arterial: 33 mmHg — ABNORMAL LOW (ref 35.0–45.0)
pH, Arterial: 7.418 — ABNORMAL HIGH (ref 7.350–7.400)
pO2, Arterial: 71.4 mmHg — ABNORMAL LOW (ref 80.0–100.0)

## 2011-09-12 LAB — URINE MICROSCOPIC-ADD ON

## 2011-09-12 LAB — PROTIME-INR
INR: 0.93 (ref 0.00–1.49)
Prothrombin Time: 12.7 seconds (ref 11.6–15.2)

## 2011-09-12 LAB — CBC
HCT: 37.4 % (ref 36.0–46.0)
Hemoglobin: 12.2 g/dL (ref 12.0–15.0)
MCH: 28 pg (ref 26.0–34.0)
MCHC: 32.6 g/dL (ref 30.0–36.0)
MCV: 86 fL (ref 78.0–100.0)
Platelets: 197 10*3/uL (ref 150–400)
RBC: 4.35 MIL/uL (ref 3.87–5.11)
RDW: 16.4 % — ABNORMAL HIGH (ref 11.5–15.5)
WBC: 5.2 10*3/uL (ref 4.0–10.5)

## 2011-09-12 LAB — APTT: aPTT: 31 seconds (ref 24–37)

## 2011-09-12 NOTE — Pre-Procedure Instructions (Addendum)
20 [name]  09/12/2011   Your procedure is scheduled on:  09/15/11  Report to [facility] at 530 AM.  Call this number if you have problems the morning of surgery: [phone]   Remember:   Do not eat food:After Midnight.  May have clear liquids: up to 4 Hours before arrival. 130 am  Clear liquids include soda, tea, black coffee, apple or grape juice, broth.  Take these medicines the morning of surgery with A SIP OF WATER: inhaler,    STOP  Fish oil, multi vit today   Do not wear jewelry, make-up or nail polish.  Do not wear lotions, powders, or perfumes. You may wear deodorant.  Do not shave 48 hours prior to surgery.  Do not bring valuables to the hospital.  Contacts, dentures or bridgework may not be worn into surgery.  Leave suitcase in the car. After surgery it may be brought to your room.  For patients admitted to the hospital, checkout time is 11:00 AM the day of discharge.   Patients discharged the day of surgery will not be allowed to drive home.  Name and phone number of your driver: dana [phone] neice  Special Instructions: CHG Shower Use Special Wash: 1/2 bottle night before surgery and 1/2 bottle morning of surgery.   Please read over the following fact sheets that you were given: Pain Booklet, Coughing and Deep Breathing, Blood Transfusion Information, MRSA Information and Surgical Site Infection Prevention

## 2011-09-14 MED ORDER — DEXTROSE 5 % IV SOLN
1.5000 g | INTRAVENOUS | Status: AC
  Administered 2011-09-15: 1.5 g via INTRAVENOUS
  Filled 2011-09-14: qty 1.5

## 2011-09-15 ENCOUNTER — Ambulatory Visit (HOSPITAL_COMMUNITY): Payer: Medicare Other | Admitting: Anesthesiology

## 2011-09-15 ENCOUNTER — Encounter (HOSPITAL_COMMUNITY)
Admission: RE | Disposition: A | Discharge status: Home / Self Care | Payer: Self-pay | Source: Ambulatory Visit | Attending: Cardiothoracic Surgery

## 2011-09-15 ENCOUNTER — Encounter (HOSPITAL_COMMUNITY): Payer: Self-pay | Admitting: Anesthesiology

## 2011-09-15 ENCOUNTER — Encounter (HOSPITAL_COMMUNITY): Payer: Self-pay | Admitting: *Deleted

## 2011-09-15 ENCOUNTER — Inpatient Hospital Stay (HOSPITAL_COMMUNITY): Payer: Medicare Other

## 2011-09-15 ENCOUNTER — Inpatient Hospital Stay (HOSPITAL_COMMUNITY)
Admission: RE | Admit: 2011-09-15 | Discharge: 2011-09-19 | DRG: 165 | Disposition: A | Discharge status: Home / Self Care | Payer: Medicare Other | Source: Ambulatory Visit | Attending: Cardiothoracic Surgery | Admitting: Cardiothoracic Surgery

## 2011-09-15 DIAGNOSIS — C3410 Malignant neoplasm of upper lobe, unspecified bronchus or lung: Principal | ICD-10-CM | POA: Diagnosis present

## 2011-09-15 DIAGNOSIS — D381 Neoplasm of uncertain behavior of trachea, bronchus and lung: Secondary | ICD-10-CM

## 2011-09-15 DIAGNOSIS — E785 Hyperlipidemia, unspecified: Secondary | ICD-10-CM | POA: Diagnosis present

## 2011-09-15 DIAGNOSIS — Z85028 Personal history of other malignant neoplasm of stomach: Secondary | ICD-10-CM

## 2011-09-15 DIAGNOSIS — Z923 Personal history of irradiation: Secondary | ICD-10-CM

## 2011-09-15 DIAGNOSIS — R918 Other nonspecific abnormal finding of lung field: Secondary | ICD-10-CM | POA: Insufficient documentation

## 2011-09-15 DIAGNOSIS — K5900 Constipation, unspecified: Secondary | ICD-10-CM | POA: Diagnosis present

## 2011-09-15 DIAGNOSIS — Z87891 Personal history of nicotine dependence: Secondary | ICD-10-CM

## 2011-09-15 DIAGNOSIS — Z79899 Other long term (current) drug therapy: Secondary | ICD-10-CM

## 2011-09-15 DIAGNOSIS — J4489 Other specified chronic obstructive pulmonary disease: Secondary | ICD-10-CM | POA: Diagnosis present

## 2011-09-15 DIAGNOSIS — J449 Chronic obstructive pulmonary disease, unspecified: Secondary | ICD-10-CM | POA: Diagnosis present

## 2011-09-15 DIAGNOSIS — Z9221 Personal history of antineoplastic chemotherapy: Secondary | ICD-10-CM

## 2011-09-15 DIAGNOSIS — M129 Arthropathy, unspecified: Secondary | ICD-10-CM | POA: Diagnosis present

## 2011-09-15 DIAGNOSIS — E876 Hypokalemia: Secondary | ICD-10-CM | POA: Diagnosis present

## 2011-09-15 SURGERY — VIDEO ASSISTED THORACOSCOPY (VATS)/WEDGE RESECTION
Anesthesia: General | Site: Chest | Laterality: Right | Wound class: Clean Contaminated

## 2011-09-15 MED ORDER — NEOSTIGMINE METHYLSULFATE 1 MG/ML IJ SOLN
INTRAMUSCULAR | Status: DC | PRN
  Administered 2011-09-15: 3 mg via INTRAVENOUS

## 2011-09-15 MED ORDER — DIPHENHYDRAMINE HCL 12.5 MG/5ML PO ELIX
12.5000 mg | ORAL_SOLUTION | Freq: Four times a day (QID) | ORAL | Status: DC | PRN
  Filled 2011-09-15: qty 5

## 2011-09-15 MED ORDER — FENTANYL 10 MCG/ML IV SOLN
INTRAVENOUS | Status: DC
  Administered 2011-09-15: 11:00:00 via INTRAVENOUS
  Administered 2011-09-15: 40 ug via INTRAVENOUS
  Administered 2011-09-15: 50 ug via INTRAVENOUS
  Administered 2011-09-16: 130 ug via INTRAVENOUS
  Administered 2011-09-16: 60 ug via INTRAVENOUS
  Administered 2011-09-16: 80 ug via INTRAVENOUS
  Administered 2011-09-16 (×2): 50 ug via INTRAVENOUS
  Administered 2011-09-16: 11:00:00 via INTRAVENOUS
  Administered 2011-09-16: 70 ug via INTRAVENOUS
  Administered 2011-09-17: 30 ug via INTRAVENOUS
  Administered 2011-09-17: 80 ug via INTRAVENOUS
  Administered 2011-09-17: 90 ug via INTRAVENOUS
  Filled 2011-09-15 (×2): qty 50

## 2011-09-15 MED ORDER — SODIUM CHLORIDE 0.9 % IJ SOLN: 9.0000 mL | INTRAMUSCULAR | Status: DC | PRN

## 2011-09-15 MED ORDER — SENNOSIDES-DOCUSATE SODIUM 8.6-50 MG PO TABS
1.0000 | ORAL_TABLET | Freq: Every evening | ORAL | Status: DC | PRN
  Filled 2011-09-15: qty 1

## 2011-09-15 MED ORDER — MIDAZOLAM HCL 2 MG/2ML IJ SOLN: 0.5000 mg | Freq: Once | INTRAMUSCULAR | Status: DC | PRN

## 2011-09-15 MED ORDER — SIMVASTATIN 10 MG PO TABS
10.0000 mg | ORAL_TABLET | Freq: Every day | ORAL | Status: DC
  Administered 2011-09-15 – 2011-09-18 (×4): 10 mg via ORAL
  Filled 2011-09-15 (×5): qty 1

## 2011-09-15 MED ORDER — IRON 325 (65 FE) MG PO TABS
1.0000 | ORAL_TABLET | Freq: Every day | ORAL | Status: DC
Start: 2011-09-15 — End: 2011-09-15

## 2011-09-15 MED ORDER — TRAMADOL HCL 50 MG PO TABS
50.0000 mg | ORAL_TABLET | Freq: Four times a day (QID) | ORAL | Status: DC | PRN
  Administered 2011-09-16 (×2): 50 mg via ORAL
  Filled 2011-09-15 (×2): qty 1

## 2011-09-15 MED ORDER — MEPERIDINE HCL 25 MG/ML IJ SOLN: 6.2500 mg | INTRAMUSCULAR | Status: DC | PRN

## 2011-09-15 MED ORDER — ALBUTEROL SULFATE HFA 108 (90 BASE) MCG/ACT IN AERS: 3.0000 | INHALATION_SPRAY | RESPIRATORY_TRACT | Status: DC | PRN

## 2011-09-15 MED ORDER — MIDAZOLAM HCL 5 MG/5ML IJ SOLN
INTRAMUSCULAR | Status: DC | PRN
  Administered 2011-09-15: 0.5 mg via INTRAVENOUS

## 2011-09-15 MED ORDER — ONDANSETRON HCL 4 MG/2ML IJ SOLN
4.0000 mg | Freq: Four times a day (QID) | INTRAMUSCULAR | Status: DC | PRN
  Administered 2011-09-15: 4 mg via INTRAVENOUS
  Filled 2011-09-15 (×2): qty 2

## 2011-09-15 MED ORDER — OMEGA-3-ACID ETHYL ESTERS 1 G PO CAPS
1.0000 g | ORAL_CAPSULE | Freq: Every day | ORAL | Status: DC
  Administered 2011-09-16 – 2011-09-19 (×4): 1 g via ORAL
  Filled 2011-09-15 (×4): qty 1

## 2011-09-15 MED ORDER — FENTANYL CITRATE 0.05 MG/ML IJ SOLN
INTRAMUSCULAR | Status: DC | PRN
  Administered 2011-09-15 (×2): 50 ug via INTRAVENOUS
  Administered 2011-09-15: 200 ug via INTRAVENOUS
  Administered 2011-09-15: 100 ug via INTRAVENOUS
  Administered 2011-09-15: 50 ug via INTRAVENOUS

## 2011-09-15 MED ORDER — OXYCODONE-ACETAMINOPHEN 5-325 MG PO TABS
1.0000 | ORAL_TABLET | ORAL | Status: DC | PRN
  Administered 2011-09-18 – 2011-09-19 (×2): 1 via ORAL
  Filled 2011-09-15 (×2): qty 1

## 2011-09-15 MED ORDER — GLYCOPYRROLATE 0.2 MG/ML IJ SOLN
INTRAMUSCULAR | Status: DC | PRN
  Administered 2011-09-15: .4 mg via INTRAVENOUS

## 2011-09-15 MED ORDER — LACTATED RINGERS IV SOLN
INTRAVENOUS | Status: DC | PRN
  Administered 2011-09-15: 07:00:00 via INTRAVENOUS

## 2011-09-15 MED ORDER — ONDANSETRON HCL 4 MG/2ML IJ SOLN
INTRAMUSCULAR | Status: DC | PRN
  Administered 2011-09-15: 4 mg via INTRAVENOUS

## 2011-09-15 MED ORDER — DEXTROSE-NACL 5-0.9 % IV SOLN
INTRAVENOUS | Status: DC
  Administered 2011-09-15: 10:00:00 via INTRAVENOUS
  Administered 2011-09-15: 100 mL/h via INTRAVENOUS
  Administered 2011-09-16 – 2011-09-17 (×2): 20 mL/h via INTRAVENOUS

## 2011-09-15 MED ORDER — NALOXONE HCL 0.4 MG/ML IJ SOLN: 0.4000 mg | INTRAMUSCULAR | Status: DC | PRN

## 2011-09-15 MED ORDER — ACETAMINOPHEN 10 MG/ML IV SOLN
1000.0000 mg | Freq: Four times a day (QID) | INTRAVENOUS | Status: AC
  Administered 2011-09-15 – 2011-09-16 (×4): 1000 mg via INTRAVENOUS
  Filled 2011-09-15 (×4): qty 100

## 2011-09-15 MED ORDER — POTASSIUM CHLORIDE 10 MEQ/50ML IV SOLN: 10.0000 meq | Freq: Every day | INTRAVENOUS | Status: DC | PRN

## 2011-09-15 MED ORDER — OMEGA-3 FATTY ACIDS 1000 MG PO CAPS: 1.0000 g | ORAL_CAPSULE | Freq: Every day | ORAL | Status: DC

## 2011-09-15 MED ORDER — DEXTROSE 5 % IV SOLN
1.5000 g | Freq: Two times a day (BID) | INTRAVENOUS | Status: AC
  Administered 2011-09-15 – 2011-09-16 (×2): 1.5 g via INTRAVENOUS
  Filled 2011-09-15 (×2): qty 1.5

## 2011-09-15 MED ORDER — PROPOFOL 10 MG/ML IV EMUL
INTRAVENOUS | Status: DC | PRN
  Administered 2011-09-15: 30 mg via INTRAVENOUS
  Administered 2011-09-15: 150 mg via INTRAVENOUS

## 2011-09-15 MED ORDER — ONDANSETRON HCL 4 MG/2ML IJ SOLN
4.0000 mg | Freq: Four times a day (QID) | INTRAMUSCULAR | Status: DC | PRN
  Administered 2011-09-16: 4 mg via INTRAVENOUS

## 2011-09-15 MED ORDER — BISACODYL 5 MG PO TBEC
10.0000 mg | DELAYED_RELEASE_TABLET | Freq: Every day | ORAL | Status: DC
  Administered 2011-09-16 – 2011-09-19 (×4): 10 mg via ORAL
  Filled 2011-09-15 (×4): qty 2

## 2011-09-15 MED ORDER — ROCURONIUM BROMIDE 100 MG/10ML IV SOLN
INTRAVENOUS | Status: DC | PRN
  Administered 2011-09-15: 50 mg via INTRAVENOUS

## 2011-09-15 MED ORDER — FERROUS SULFATE 325 (65 FE) MG PO TABS
325.0000 mg | ORAL_TABLET | Freq: Every day | ORAL | Status: DC
  Administered 2011-09-16 – 2011-09-18 (×3): 325 mg via ORAL
  Filled 2011-09-15 (×5): qty 1

## 2011-09-15 MED ORDER — DIPHENHYDRAMINE HCL 50 MG/ML IJ SOLN: 12.5000 mg | Freq: Four times a day (QID) | INTRAMUSCULAR | Status: DC | PRN

## 2011-09-15 MED ORDER — PROMETHAZINE HCL 25 MG/ML IJ SOLN: 6.2500 mg | INTRAMUSCULAR | Status: DC | PRN

## 2011-09-15 MED ORDER — OXYCODONE HCL 5 MG PO TABS: 5.0000 mg | ORAL_TABLET | ORAL | Status: AC | PRN

## 2011-09-15 MED ORDER — HYDROMORPHONE HCL PF 1 MG/ML IJ SOLN
0.2500 mg | INTRAMUSCULAR | Status: DC | PRN
  Administered 2011-09-15 (×2): 0.5 mg via INTRAVENOUS

## 2011-09-15 SURGICAL SUPPLY — 62 items
APL SRG 22X2 LUM MLBL SLNT (VASCULAR PRODUCTS)
APL SRG 7X2 LUM MLBL SLNT (VASCULAR PRODUCTS)
APPLICATOR TIP COSEAL (VASCULAR PRODUCTS) IMPLANT
APPLICATOR TIP EXT COSEAL (VASCULAR PRODUCTS) IMPLANT
CANISTER SUCTION 2500CC (MISCELLANEOUS) ×2 IMPLANT
CATH KIT ON Q 5IN SLV (PAIN MANAGEMENT) IMPLANT
CATH THORACIC 28FR (CATHETERS) IMPLANT
CATH THORACIC 36FR (CATHETERS) IMPLANT
CATH THORACIC 36FR RT ANG (CATHETERS) IMPLANT
CLIP TI MEDIUM 6 (CLIP) ×1 IMPLANT
CLOTH BEACON ORANGE TIMEOUT ST (SAFETY) ×2 IMPLANT
CONT SPEC 4OZ CLIKSEAL STRL BL (MISCELLANEOUS) ×4 IMPLANT
DRAPE LAPAROSCOPIC ABDOMINAL (DRAPES) ×2 IMPLANT
DRAPE SLUSH MACHINE 52X66 (DRAPES) IMPLANT
DRAPE SLUSH/WARMER DISC (DRAPES) ×1 IMPLANT
DRILL BIT 7/64X5 (BIT) ×2 IMPLANT
ELECT REM PT RETURN 9FT ADLT (ELECTROSURGICAL) ×2
ELECTRODE REM PT RTRN 9FT ADLT (ELECTROSURGICAL) ×1 IMPLANT
GLOVE BIO SURGEON STRL SZ 6.5 (GLOVE) ×4 IMPLANT
GOWN STRL NON-REIN LRG LVL3 (GOWN DISPOSABLE) ×8 IMPLANT
HANDLE UNIV GIA SHORT (MISCELLANEOUS) ×1 IMPLANT
KIT BASIN OR (CUSTOM PROCEDURE TRAY) ×2 IMPLANT
KIT ROOM TURNOVER OR (KITS) ×2 IMPLANT
KIT SUCTION CATH 14FR (SUCTIONS) ×2 IMPLANT
NS IRRIG 1000ML POUR BTL (IV SOLUTION) ×4 IMPLANT
PACK CHEST (CUSTOM PROCEDURE TRAY) ×2 IMPLANT
PAD ARMBOARD 7.5X6 YLW CONV (MISCELLANEOUS) ×4 IMPLANT
RELOAD EGIA 45 MED/THCK PURPLE (STAPLE) ×2 IMPLANT
RELOAD EGIA 60 MED/THCK PURPLE (STAPLE) ×2 IMPLANT
RELOAD STAPLE 60 MED/THCK ART (STAPLE) IMPLANT
SEALANT PROGEL (MISCELLANEOUS) IMPLANT
SEALANT SURG COSEAL 4ML (VASCULAR PRODUCTS) IMPLANT
SEALANT SURG COSEAL 8ML (VASCULAR PRODUCTS) IMPLANT
SOLUTION ANTI FOG 6CC (MISCELLANEOUS) ×1 IMPLANT
SPONGE GAUZE 4X4 12PLY (GAUZE/BANDAGES/DRESSINGS) ×2 IMPLANT
SUT PROLENE 3 0 SH DA (SUTURE) IMPLANT
SUT PROLENE 4 0 RB 1 (SUTURE) ×2
SUT PROLENE 4-0 RB1 .5 CRCL 36 (SUTURE) IMPLANT
SUT SILK  1 MH (SUTURE) ×2
SUT SILK 1 MH (SUTURE) ×2 IMPLANT
SUT SILK 2 0SH CR/8 30 (SUTURE) IMPLANT
SUT SILK 3 0SH CR/8 30 (SUTURE) ×1 IMPLANT
SUT STEEL 1 (SUTURE) ×4 IMPLANT
SUT VIC AB 1 CTX 18 (SUTURE) IMPLANT
SUT VIC AB 1 CTX 36 (SUTURE)
SUT VIC AB 1 CTX36XBRD ANBCTR (SUTURE) IMPLANT
SUT VIC AB 2-0 CTX 36 (SUTURE) ×2 IMPLANT
SUT VIC AB 2-0 UR6 27 (SUTURE) ×1 IMPLANT
SUT VIC AB 3-0 SH 8-18 (SUTURE) IMPLANT
SUT VIC AB 3-0 X1 27 (SUTURE) ×2 IMPLANT
SUT VICRYL 2 TP 1 (SUTURE) ×1 IMPLANT
SWAB COLLECTION DEVICE MRSA (MISCELLANEOUS) IMPLANT
SYSTEM SAHARA CHEST DRAIN ATS (WOUND CARE) ×2 IMPLANT
TAPE CLOTH SURG 4X10 WHT LF (GAUZE/BANDAGES/DRESSINGS) ×1 IMPLANT
TIP APPLICATOR SPRAY EXTEND 16 (VASCULAR PRODUCTS) IMPLANT
TOWEL OR 17X24 6PK STRL BLUE (TOWEL DISPOSABLE) ×2 IMPLANT
TOWEL OR 17X26 10 PK STRL BLUE (TOWEL DISPOSABLE) ×4 IMPLANT
TRAP SPECIMEN MUCOUS 40CC (MISCELLANEOUS) ×1 IMPLANT
TRAY FOLEY CATH 14FRSI W/METER (CATHETERS) ×2 IMPLANT
TUBE ANAEROBIC SPECIMEN COL (MISCELLANEOUS) IMPLANT
TUNNELER SHEATH ON-Q 11GX8 (MISCELLANEOUS) IMPLANT
WATER STERILE IRR 1000ML POUR (IV SOLUTION) ×4 IMPLANT

## 2011-09-15 NOTE — Anesthesia Preprocedure Evaluation (Addendum)
Anesthesia Evaluation  Patient identified by MRN, date of birth, ID band Patient awake    Reviewed: Allergy & Precautions, H&P , NPO status , Patient's Chart, lab work & pertinent test results  History of Anesthesia Complications Negative for: history of anesthetic complications  Airway Mallampati: II TM Distance: >3 FB     Dental  (+) Edentulous Upper and Edentulous Lower   Pulmonary shortness of breath, COPD COPD inhaler, former smoker breath sounds clear to auscultation  Pulmonary exam normal       Cardiovascular hypertension, Pt. on medications Rhythm:Regular Rate:Normal     Neuro/Psych negative neurological ROS     GI/Hepatic Neg liver ROS, H/o gastric CA s/p resection radiation and chemotherapy   Endo/Other  negative endocrine ROS  Renal/GU negative Renal ROS     Musculoskeletal   Abdominal   Peds  Hematology   Anesthesia Other Findings   Reproductive/Obstetrics                        Anesthesia Physical Anesthesia Plan  ASA: III  Anesthesia Plan: General   Post-op Pain Management:    Induction: Intravenous  Airway Management Planned: Double Lumen EBT  Additional Equipment: Arterial line and CVP  Intra-op Plan:   Post-operative Plan: Extubation in OR  Informed Consent: I have reviewed the patients History and Physical, chart, labs and discussed the procedure including the risks, benefits and alternatives for the proposed anesthesia with the patient or authorized representative who has indicated his/her understanding and acceptance.     Plan Discussed with: Anesthesiologist, CRNA and Surgeon  Anesthesia Plan Comments: (Plan routine monitors, A line, CVP, GETA with DLT)       Anesthesia Quick Evaluation

## 2011-09-15 NOTE — Progress Notes (Signed)
UR COMPLETED  

## 2011-09-15 NOTE — Anesthesia Procedure Notes (Addendum)
Procedure Name: Intubation Date/Time: 09/15/2011 7:42 AM Performed by: Sherie Don Pre-anesthesia Checklist: Patient identified, Emergency Drugs available, Suction available, Patient being monitored and Timeout performed Patient Re-evaluated:Patient Re-evaluated prior to inductionOxygen Delivery Method: Circle system utilized Preoxygenation: Pre-oxygenation with 100% oxygen Intubation Type: IV induction Ventilation: Mask ventilation without difficulty and Oral airway inserted - appropriate to patient size Laryngoscope Size: Mac and 3 Grade View: Grade I Tube type: Oral Endobronchial tube: Left, EBT position confirmed by auscultation, EBT position confirmed by fiberoptic bronchoscope and Double lumen EBT and 35 Fr Number of attempts: 1 Airway Equipment and Method: Oral airway,  Fiberoptic brochoscope and Stylet Placement Confirmation: ETT inserted through vocal cords under direct vision,  positive ETCO2 and breath sounds checked- equal and bilateral Secured at: 29 cm Tube secured with: Tape Dental Injury: Teeth and Oropharynx as per pre-operative assessment

## 2011-09-15 NOTE — H&P (Signed)
[address]            [place] [address]          [phone]       [name] [facility] Medical Record #[id number] Date of Birth: 01/20/1931  Referring: No ref. 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  Her wt has been stable and porta cath and feeding tube were recently removed. Follow up scans including PET show rt upper lung mass, hypermetabloic. Patient is former smoker, quit in 1990  Bronchoscopy with ENB was performed but no confirmed cancer was noted on the pathology final review. The cultures and smears also revealed no definitive diagnosis.   Current Activity/ Functional Status: Patient is independent with mobility/ambulation, transfers, ADL's, IADL's.   Past Medical History  Diagnosis Date  . Dyslipidemia   . Arthritis     Hx of osteoarthritis  . Shortness of breath     WITH EXERTION   . COPD (chronic obstructive pulmonary disease)   . Anemia   . History of chemotherapy     carboplatin/taxol  . Lung mass   . Gastric cancer November 08, 2010    Past Surgical History  Procedure Date  . Esophagogastrectomy, j tube 11/08/2010   . Cholecystectomy   . Abdominal hysterectomy   . Cholecystectomy   . Cataract extraction     RIGHT EYE   . Port-a-cath removal 05/13/2011    Procedure: REMOVAL PORT-A-CATH;  Surgeon: Almond Lint, MD;  Location: [facility] OR;  Service: General;  Laterality: N/A;  Removal port-a-cath.  . Portacath placement   . Eye surgery     Family History  Problem Relation Age of Onset  . Cancer Sister     unknown  History of treated TB in brother, but at time patient did not have contact with  the brother  History   Social History  . Marital Status: Widowed         Social History  Main Topics  . Smoking status: Former Smoker -- 0.2 packs/day    Types: Cigarettes    Quit date: 05/16/1988  . Smokeless tobacco: Never Used  . Alcohol Use: No  . Drug Use: No  . Sexually Active: Not on file      History  Smoking status  . Former Smoker -- 0.2 packs/day for 30 years  . Types: Cigarettes  . Quit date: 05/16/1988  Smokeless tobacco  . Never Used    History  Alcohol Use No     No Known Allergies  Current Facility-Administered Medications  Medication Dose Route Frequency Provider Last Rate Last Dose  . cefUROXime (ZINACEF) 1.5 g in dextrose 5 % 50 mL IVPB  1.5 g Intravenous 60 min Pre-Op [name], MD           Review of Systems:     Cardiac Review of Systems: Y or N  Chest Pain [  n  ]  Resting SOB [ y  ] Exertional SOB  Cove.Etienne  ]  Kenese.Mounts [  ]  Pedal Edema [   ]    Palpitations [  ] Syncope  [  ]   Presyncope [   ]  General Review of Systems: [Y] = yes [  ]=no Constitional: recent weight change [ n ]; anorexia [n  ]; fatigue Cove.Etienne  ]; nausea [n  ]; night sweats [n  ]; fever [ n ]; or chills [n  ];                                                                                                                                          Dental: poor dentition[  ];   Eye : blurred vision [  ]; diplopia [   ]; vision changes [ n ];  Amaurosis fugax[  ]; Resp: cough [  ];  wheezing[  ];  hemoptysis[  ]; shortness of breath[ y ]; paroxysmal nocturnal dyspnea[n  ]; dyspnea on exertion[y  ]; or orthopnea[  ];  GI:  gallstones[  ], vomiting[  ];  dysphagia[  ]; melena[ n ];  hematochezia [n  ]; heartburn[  ];   Hx of  Colonoscopy[  ]; GU: kidney stones [  ]; hematuria[  ];   dysuria [  ];  nocturia[  ];  history of     obstruction [  ];             Skin: rash, swelling[  ];, hair loss[  ];  peripheral edema[  ];  or itching[  ]; Musculosketetal: myalgias[  ];  joint swelling[  ];  joint erythema[  ];  joint pain[  ];  back pain[  ];  Heme/Lymph: bruising[  ];   bleeding[  ];  anemia[  ];  Neuro: TIA[  ];  headaches[  ];  stroke[ n ];  vertigo[  ];  seizures[  ];   paresthesias[  ];  difficulty walking[  ];  Psych:depression[  ]; anxiety[  ];  Endocrine: diabetes[  ];  thyroid dysfunction[  ];  Immunizations: Flu [  ]; Pneumococcal[  ];  Other:  Physical Exam: BP 131/88  Pulse 76  Temp(Src) 98 F (36.7 C) (Oral)  Resp 18  SpO2 95%  General appearance: alert, cooperative, appears older than stated age, cachectic and no distress Neurologic: intact Heart: regular rate and rhythm, S1, S2 normal, no murmur, click, rub or gallop, normal apical impulse and no rub Lungs: clear to auscultation bilaterally and normal percussion bilaterally Abdomen: soft, non-tender; bowel sounds normal; no masses,  no organomegaly and healed incision Extremities: extremities normal, atraumatic, no cyanosis or edema, Homans sign is negative, no sign of DVT, no ulcers, gangrene or trophic changes and palpable pedal pulses no carotid briuts No palpable adenopathy, cervical or supraclavicular fossa or axillary, there is a node noted on CT scan in the left axilla but this is not palpable No change in her physical exams and she  was examined last week  Diagnostic Studies & Laboratory data:    Nm Pet Image Initial (pi) Skull Base To Thigh  08/11/2011  *RADIOLOGY REPORT*  Clinical Data: Subsequent treatment strategy for gastric cancer.  NUCLEAR MEDICINE PET SKULL BASE TO THIGH  Fasting Blood Glucose:  85  Technique:  19.3 mCi F-18 FDG was injected intravenously. CT data was obtained and used for attenuation correction and anatomic localization only.  (This was not acquired as a diagnostic CT examination.) Additional exam technical data entered on technologist worksheet.  Comparison:  Multiple priors, most recently PET CT dated 12/20/2010, and chest CT dated 07/23/2011.  Findings:  Neck: No hypermetabolic lymph nodes in the neck.  There is some symmetrically increased activity in the  region of the tonsillar pillars bilaterally, favored be reactive as there is no soft tissue abnormality noted on the CT portion of the examination.  Chest: There has been some interval growth of what is now a 3.2 x 2.1 cm pleural based mass in the medial aspect of the right upper lobe apex which is hypermetabolic (SUVmax = 6.9).  Linear opacities in the medial segment of the right middle lobe and the inferior segment of the lingula likely reflect areas of atelectasis and/or scarring.  No additional definite suspicious-appearing pulmonary nodules or masses are otherwise noted.  There is a new small left- sided pleural effusion. Heart size is mildly enlarged. There is no significant pericardial fluid, thickening or pericardial calcification. There is atherosclerosis of the thoracic aorta, the great vessels of the mediastinum and the coronary arteries, including calcified atherosclerotic plaque in the left main, left anterior descending, left circumflex and right coronary arteries. No pathologically enlarged or hypermetabolic mediastinal or hilar lymph nodes.    A nonenlarged (8 mm short axis) left axillary lymph node is noted that demonstrates some low level increased metabolic activity (SUVmax = 2.3), which is similar in retrospect compared to prior PET CT 12/20/2010, and highly nonspecific, presumably reactive.  Abdomen/Pelvis:  No abnormal hypermetabolic activity within the liver, pancreas, adrenal glands, or spleen.  No hypermetabolic lymph nodes in the abdomen or pelvis.  Postoperative changes of partial gastrectomy near the GE junction are noted.  Status post cholecystectomy.  Soft tissue in the region of the pancreaticoduodenal groove is indistinct, and there is stranding throughout the peripancreatic retroperitoneal soft tissues.  Mild bilateral perinephric stranding is also noted.  Low attenuation is noted throughout the second and third segments of the liver, likely to represent focal fatty infiltration  atherosclerosis of the abdominal and pelvic vasculature, without focal aneurysm.  Small volume of ascites layering within the cul-de-sac.  Status post hysterectomy. Previously noted jejunostomy tube has been removed and there is a healed tract in the anterior abdominal wall overlying left upper quadrant of the abdomen.  [place]:  No focal hypermetabolic activity to suggest skeletal metastasis.  IMPRESSION: 1.  3.2 x 2.1 cm pleural based hypermetabolic mass in the medial aspect of the right lung apex is consistent with malignancy. Whether not this represents a solitary pulmonary metastasis (this would be an unusual location for metastatic lesion), or a primary lung lesion is uncertain. 2.  Nonenlarged 8 mm left axillary lymph node with low-level increased metabolic activity, similar to prior study.  This finding is nonspecific and presumably reactive, however, continued attention on follow-up studies is recommended. 3.  Poor definition of soft tissues in the pancreaticoduodenal groove with stranding throughout the peripancreatic fat of the retroperitoneum.  Findings are nonspecific, but can be seen in  the setting of acute pancreatitis.  Clinical correlation is recommended. 3.  There is also a small volume of ascites layering within the cul- de-sac. 4.  Focal fatty infiltration in segments two and three in the liver. 5. Atherosclerosis, including left main and three-vessel coronary artery disease. Please note that although the presence of coronary artery calcium documents the presence of coronary artery disease, the severity of this disease and any potential stenosis cannot be assessed on this non-gated CT examination.  Assessment for potential risk factor modification, dietary therapy or pharmacologic therapy may be warranted, if clinically indicated. 6.  Additional postoperative changes, as above.  These results will be called to the ordering clinician or representative by the Radiologist Assistant, and  communication documented in the PACS Dashboard.  Original Report Authenticated By: Florencia Reasons, M.D.    CT CHEST WITH CONTRAST   Technique:  Multidetector CT imaging of the chest was performed following the standard protocol during bolus administration of intravenous contrast.   Contrast: 80mL OMNIPAQUE IOHEXOL 300 MG/ML IJ SOLN   Comparison: CT of the abdomen and pelvis 06/12/2011.  CT of chest 05/30/2010.   Findings:   Mediastinum: Heart size is normal. There is no significant pericardial fluid, thickening or pericardial calcification.   The esophagus is patulous and fluid-filled. No pathologically enlarged mediastinal or hilar lymph nodes.   There is atherosclerosis of the thoracic aorta, the great vessels of the mediastinum and the coronary arteries, including calcified atherosclerotic plaque in the left anterior descending, left circumflex and right coronary arteries.   Lungs/Pleura: Within the medial aspect of the apex of the right upper lobe there is a new 2.8 x 1.9 cm mass which abuts the mediastinal pleura, without definitive evidence to suggest mediastinal invasion at this time.  No additional suspicious appearing pulmonary nodules or masses are otherwise identified. However, there are some new opacities in the medial aspects of the lung bases bilaterally which are favored to represent areas of scarring (however, some neoplasms such as gastric adenocarcinoma can have a very infiltrative and ill-defined appearance with their metastases).  There is a background of moderate centrilobular emphysema.  Some faint subpleural reticulation is scattered throughout the periphery of the lungs bilaterally, similar compared to the prior examination, without frank honeycombing or traction bronchiectasis to strongly suggest the presence of an underlying interstitial lung disease.  No pleural effusions.   Upper Abdomen: Postsurgical changes likely from prior  partial gastrectomy are noted.  A surgical clip is noted in the gallbladder fossa consistent with prior history of cholecystectomy.   Musculoskeletal: There are no aggressive appearing lytic or blastic lesions noted in the visualized portions of the skeleton.   IMPRESSION: 1.  Interval development of a 2.8 x 1.9 cm macrolobulated nodule in the medial aspect of the right upper lobe that abuts the pleura overlying the mediastinum (without definitive evidence of mediastinal invasion at this time).  This finding is concerning for potential solitary metastasis or primary bronchogenic neoplasm, although this may less likely represent a focus of inflammation/infection.  No other definite evidence of metastatic disease is otherwise noted.  2. Atherosclerosis, including three-vessel coronary artery disease. Please note that although the presence of coronary artery calcium documents the presence of coronary artery disease, the severity of this disease and any potential stenosis cannot be assessed on this non-gated CT examination.  Assessment for potential risk factor modification, dietary therapy or pharmacologic therapy may be warranted, if clinically indicated. 3. Background of moderate centrilobular emphysema again noted.  Recent Lab Findings: Lab Results  Component Value Date   WBC 5.2 09/12/2011   HGB 12.2 09/12/2011   HCT 37.4 09/12/2011   PLT 197 09/12/2011   GLUCOSE 89 09/12/2011   ALT 25 09/12/2011   AST 30 09/12/2011   NA 139 09/12/2011   K 4.3 09/12/2011   CL 110 09/12/2011   CREATININE 0.63 09/12/2011   BUN 11 09/12/2011   CO2 18* 09/12/2011   INR 0.93 09/12/2011      Assessment / Plan:   1,New enlarging rt upper lobe lung lesion in patient with recently resected pT4N2 adeno CA of cardia, unclear if MET or new primary lung CA 2, No history of Coronary Disease but CT evidence of calcified left main. She tolerated recent gastrectomy without difficulty. 3' negative path  from ENB  Plan: Dr. Christella Hartigan has reviewed the CT/pet and is willing to attempt esophageal ultrasound and needle biopsy. I discussed options with the patient and her granddaughter in detail. The medial position of the lesion makes percutaneous biopsy difficult. After discussing video-assisted thoracoscopy and wedge resection of the lesion versus further attempts at needle biopsy the patient has decided to proceed with definitive biopsy and wedge resection of lesion. She understands that if this is a metastatic deposit or wedge resection would not be curative.  We'll tentatively plan to go ahead may 13 without  further attempts to do needle bx  The goals risks and alternatives of the planned surgical procedure right VATS Lung resection have been discussed with the patient in detail. The risks of the procedure including death, infection, stroke, myocardial infarction, bleeding, blood transfusion have all been discussed specifically.  I have quoted [name] a 3 % of perioperative mortality and a complication rate as high as15 %. The patient's questions have been answered.[name] is willing  to proceed with the planned procedure.   [name] MD  Beeper [phone] Office [phone] 09/15/2011 7:07 AM

## 2011-09-15 NOTE — Preoperative (Signed)
Beta Blockers   Reason not to administer Beta Blockers:Not Applicable 

## 2011-09-15 NOTE — Transfer of Care (Signed)
Immediate Anesthesia Transfer of Care Note  Patient: [name]  Procedure(s) Performed: Procedure(s) (LRB): VIDEO ASSISTED THORACOSCOPY (VATS)/WEDGE RESECTION (Right)  Patient Location: PACU  Anesthesia Type: General  Level of Consciousness: awake and patient cooperative  Airway & Oxygen Therapy: Patient Spontanous Breathing and Patient connected to face mask oxygen  Post-op Assessment: Report given to PACU RN, Post -op Vital signs reviewed and stable and Patient moving all extremities X 4  Post vital signs: Reviewed and stable  Complications: No apparent anesthesia complications

## 2011-09-15 NOTE — Anesthesia Postprocedure Evaluation (Signed)
  Anesthesia Post-op Note  Patient: [name]  Procedure(s) Performed: Procedure(s) (LRB): VIDEO ASSISTED THORACOSCOPY (VATS)/WEDGE RESECTION (Right)  Patient Location: PACU  Anesthesia Type: General  Level of Consciousness: awake, alert  and oriented  Airway and Oxygen Therapy: Patient Spontanous Breathing and Patient connected to nasal cannula oxygen  Post-op Pain: mild  Post-op Assessment: Post-op Vital signs reviewed, Patient's Cardiovascular Status Stable, Respiratory Function Stable, Patent Airway, No signs of Nausea or vomiting and Pain level controlled  Post-op Vital Signs: Reviewed and stable  Complications: No apparent anesthesia complications

## 2011-09-15 NOTE — Brief Op Note (Signed)
09/15/2011  9:13 AM  PATIENT:  [name]  78 y.o. female  PRE-OPERATIVE DIAGNOSIS:  (R) UPPER LUNG MASS  POST-OPERATIVE DIAGNOSIS:  (R) UPPER LUNG MASS  PROCEDURE:  VIDEO ASSISTED THORACOSCOPY (VATS)/WEDGE RESECTION (Right) Upper Lung mass  SURGEON:  Surgeon(s) and Role:    * [name], MD - Primary  PHYSICIAN ASSISTANT: Doree Fudge PA-C  ANESTHESIA:   general  EBL:  Total I/O In: 1000 [I.V.:1000] Out: 125 [Urine:125]  BLOOD ADMINISTERED:none  DRAINS: One 28 french  Chest Tube(s) in the right pleural space   SPECIMEN:  Source of Specimen:  Wedge resection of RUL lung mass  DISPOSITION OF SPECIMEN:  PATHOLOGY  COUNTS CORRECT:  YES  DICTATION: .Dragon Dictation  PLAN OF CARE: Admit to inpatient   PATIENT DISPOSITION:  PACU - hemodynamically stable.   Delay start of Pharmacological VTE agent (>24hrs) due to surgical blood loss or risk of bleeding: yes

## 2011-09-16 ENCOUNTER — Inpatient Hospital Stay (HOSPITAL_COMMUNITY): Payer: Medicare Other

## 2011-09-16 LAB — CBC
HCT: 35.5 % — ABNORMAL LOW (ref 36.0–46.0)
Hemoglobin: 11.8 g/dL — ABNORMAL LOW (ref 12.0–15.0)
MCH: 28.2 pg (ref 26.0–34.0)
MCHC: 33.2 g/dL (ref 30.0–36.0)
RDW: 15.8 % — ABNORMAL HIGH (ref 11.5–15.5)

## 2011-09-16 LAB — BLOOD GAS, ARTERIAL
Acid-Base Excess: 0.2 mmol/L (ref 0.0–2.0)
Bicarbonate: 24.8 mEq/L — ABNORMAL HIGH (ref 20.0–24.0)
O2 Saturation: 97.4 %
Patient temperature: 98.6
TCO2: 26.1 mmol/L (ref 0–100)
pO2, Arterial: 95.4 mmHg (ref 80.0–100.0)

## 2011-09-16 LAB — BASIC METABOLIC PANEL
BUN: 5 mg/dL — ABNORMAL LOW (ref 6–23)
CO2: 23 mEq/L (ref 19–32)
Chloride: 104 mEq/L (ref 96–112)
Creatinine, Ser: 0.57 mg/dL (ref 0.50–1.10)
Glucose, Bld: 141 mg/dL — ABNORMAL HIGH (ref 70–99)

## 2011-09-16 MED ORDER — WHITE PETROLATUM GEL
Status: AC
  Administered 2011-09-16: 22:00:00
  Filled 2011-09-16: qty 5

## 2011-09-16 MED ORDER — POTASSIUM CHLORIDE 20 MEQ/15ML (10%) PO LIQD
20.0000 meq | Freq: Once | ORAL | Status: AC
  Administered 2011-09-16: 20 meq via ORAL
  Filled 2011-09-16: qty 15

## 2011-09-16 MED ORDER — ALUM & MAG HYDROXIDE-SIMETH 200-200-20 MG/5ML PO SUSP
30.0000 mL | ORAL | Status: DC | PRN
  Administered 2011-09-16: 30 mL via ORAL
  Filled 2011-09-16: qty 30

## 2011-09-16 NOTE — Progress Notes (Signed)
Patient's foley d/c'd, per order; tolerated well. Patient's a-line d/c'd per policy, per MD order, pressure dressing applied; patient tolerated well, will continue to monitor.

## 2011-09-16 NOTE — Progress Notes (Signed)
Right CT placed to water-seal per MD order, will continue to monitor

## 2011-09-16 NOTE — Progress Notes (Addendum)
[address]            [place] [address]          [phone]     1 Day Post-Op Procedure(s) (LRB): VIDEO ASSISTED THORACOSCOPY (VATS)/WEDGE RESECTION (Right)  Subjective: OOB in chair.  Sore, but no real pain.  Breathing stable.  Currently, sats 97% on room air.  Objective: Vital signs in last 24 hours: Patient Vitals for the past 24 hrs:  BP Temp Temp src Pulse Resp SpO2 Height Weight  09/16/11 0825 - - - - 16  100 % - -  09/16/11 0800 114/60 mmHg 98.4 F (36.9 C) Oral 60  13  100 % - -  09/16/11 0415 135/71 mmHg 97.6 F (36.4 C) Oral 81  19  100 % - 117 lb 1 oz (53.1 kg)  09/16/11 0025 134/66 mmHg 97.6 F (36.4 C) Oral 56  15  99 % - -  09/15/11 2020 149/83 mmHg 97.5 F (36.4 C) Oral 61  17  100 % - -  09/15/11 1949 - - - - 18  100 % - -  09/15/11 1600 153/73 mmHg 97.9 F (36.6 C) Oral - - - - -  09/15/11 1541 - - - - 21  - - -  09/15/11 1254 156/73 mmHg 97.3 F (36.3 C) Oral - - - 4\' 11"  (1.499 m) 113 lb 15.7 oz (51.7 kg)  09/15/11 1200 - - - 58  20  100 % - -  09/15/11 1145 172/94 mmHg 97 F (36.1 C) - 51  20  100 % - -  09/15/11 1130 156/67 mmHg - - 50  19  100 % - -  09/15/11 1115 165/75 mmHg - - 50  19  100 % - -  09/15/11 1100 149/65 mmHg - - 53  16  100 % - -  09/15/11 1045 145/65 mmHg - - 55  24  100 % - -  09/15/11 1030 136/69 mmHg - - 56  16  100 % - -  09/15/11 1015 144/54 mmHg - - 64  18  100 % - -  09/15/11 1000 154/74 mmHg - - 77  14  100 % - -  09/15/11 0954 147/72 mmHg - - - - - - -  09/15/11 0950 - 97.1 F (36.2 C) - 61  - 100 % - -   Current Weight  09/16/11 117 lb 1 oz (53.1 kg)     Intake/Output from previous day: 05/13 0701 - 05/14 0700 In: 2550 [I.V.:2300; IV Piggyback:250] Out: 1740 [Urine:1625; Blood:100; Chest Tube:15]    PHYSICAL EXAM:  Heart: RRR Lungs: clear Wound: dressed and dry Chest tube: no air leak on H2O seal   Lab Results: CBC: Basename 09/16/11 0358  WBC 7.8  HGB 11.8*    HCT 35.5*  PLT 168   BMET:  Basename 09/16/11 0358  NA 137  K 3.6  CL 104  CO2 23  GLUCOSE 141*  BUN 5*  CREATININE 0.57  CALCIUM 8.7    CXR: Right chest tube good position. Tiny right apical pneumothorax.  Mild bibasilar volume loss.   Assessment/Plan: S/P Procedure(s) (LRB): VIDEO ASSISTED THORACOSCOPY (VATS)/WEDGE RESECTION (Right) Chest tube has been placed to water seal.  No air leak.   IV to [facility], D/C Foley/art line today. Hypokalemia- replace K+. Mobilize as tolerated, continue pulm toilet.   LOS: 1  day    COLLINS,GINA H 09/16/2011   I have seen and examined [name] and agree with the above assessment  and plan.  [name] MD Beeper [phone] Office [phone] 09/16/2011 8:45am

## 2011-09-16 NOTE — Op Note (Signed)
[name], [name]             ACCOUNT NO.:  [id number]  MEDICAL RECORD NO.:  [id number]  LOCATION:  3311                         FACILITY:  [facility]  PHYSICIAN:  Sheliah Plane, MD    DATE OF BIRTH:  18-Oct-1930  DATE OF PROCEDURE:  09/15/2011 DATE OF DISCHARGE:                              OPERATIVE REPORT   PREOPERATIVE DIAGNOSIS:  Right upper lobe lung nodule.  POSTOPERATIVE DIAGNOSIS:  Spindle cell tumor.  PROCEDURE PERFORMED:  Right video-assisted thoracoscopy with wedge resection of right upper lobe.  SURGEON:  Sheliah Plane, MD  FIRST ASSISTANT:  Doree Fudge, PA  BRIEF HISTORY:  The patient is a 78 year old female with a previous history of T4N2 adenocarcinoma of the stomach which was resected and treated with chemotherapy and radiation.  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 Followup scan showed an enlarging right upper lobe lung nodule. Attempts at ENP to obtain a tissue diagnosis were unsuccessful, and so a video-assisted thoracoscopy and wedge resection was recommended to the patient who agreed and signed informed consent.  DESCRIPTION OF THE PROCEDURE:  With central line and arterial line in place, the patient underwent general endotracheal anesthesia without incident.  The patient was turned in lateral decubitus position with the right side up.  Time-out protocol was carried out.  The right chest was prepped with Betadine and draped in usual sterile manner.  Two port incisions were made, 1 in the midaxillary line approximately fourth intercostal space and 1 slightly further anterior.  With the 30-degree scope was introduced into the chest, the lung was nicely collapsed.  The lung was manipulated and the area of the mass was easily identified.  A third more posterior port was placed through this.  A stapler was placed.  This easily afforded stapling across the lung to resect the nodule.  The nodule  was then brought out through the more anterior port site which had been expanded slightly without spreading the ribs to remove the specimen.  Frozen section showed clear margins and spindle cell pathology.  Blood loss was minimal.  A single 28 chest tube was left through 1 port site.  The other port sites were closed with 0 Vicryl in the deep tissue and 3-0 subcuticular stitches.  The lung was re-expanded.  The patient was awakened and extubated in the operating room, transferred to the recovery room for postoperative care.  Sponge and needle count was reported as correct.  The patient tolerated the procedure without obvious complication.    Sheliah Plane, MD    EG/MEDQ  D:  09/16/2011  T:  09/16/2011  Job:  [id number]

## 2011-09-16 NOTE — Progress Notes (Signed)
Patient taken off end tidal CO2 monitor, due to patient's request and that patient is very alert.  Patient also placed on RA, no SOB and o2 stats above 92%; will continue to monitor.

## 2011-09-17 ENCOUNTER — Inpatient Hospital Stay (HOSPITAL_COMMUNITY): Payer: Medicare Other

## 2011-09-17 LAB — COMPREHENSIVE METABOLIC PANEL
ALT: 37 U/L — ABNORMAL HIGH (ref 0–35)
Alkaline Phosphatase: 100 U/L (ref 39–117)
BUN: 5 mg/dL — ABNORMAL LOW (ref 6–23)
CO2: 26 mEq/L (ref 19–32)
Chloride: 108 mEq/L (ref 96–112)
GFR calc Af Amer: >90 mL/min (ref >90)
GFR calc non Af Amer: 87 mL/min — ABNORMAL LOW (ref >90)
Glucose, Bld: 86 mg/dL (ref 70–99)
Potassium: 3.6 mEq/L (ref 3.5–5.1)
Sodium: 140 mEq/L (ref 135–145)
Total Bilirubin: 0.5 mg/dL (ref 0.3–1.2)

## 2011-09-17 LAB — CBC
HCT: 34 % — ABNORMAL LOW (ref 36.0–46.0)
Hemoglobin: 11.1 g/dL — ABNORMAL LOW (ref 12.0–15.0)
RBC: 3.93 MIL/uL (ref 3.87–5.11)

## 2011-09-17 MED ORDER — SODIUM CHLORIDE 0.9 % IJ SOLN
INTRAMUSCULAR | Status: AC
  Filled 2011-09-17: qty 10

## 2011-09-17 MED ORDER — POTASSIUM CHLORIDE CRYS ER 20 MEQ PO TBCR
20.0000 meq | EXTENDED_RELEASE_TABLET | Freq: Two times a day (BID) | ORAL | Status: DC
  Administered 2011-09-17 – 2011-09-19 (×5): 20 meq via ORAL
  Filled 2011-09-17 (×6): qty 1

## 2011-09-17 NOTE — Care Management Note (Signed)
    Page 1 of 1   09/17/2011     3:42:06 PM   CARE MANAGEMENT NOTE 09/17/2011  Patient:  [name], [name]   Account Number:  [id number]  Date Initiated:  09/17/2011  Documentation initiated by:  Donn Pierini  Subjective/Objective Assessment:   Pt admitted s/p VATS     Action/Plan:   PTA pt lived at home alone, was independent with ADLs   Anticipated DC Date:  09/19/2011   Anticipated DC Plan:  HOME/SELF CARE      DC Planning Services  CM consult      Choice offered to / List presented to:             Status of service:  In process, will continue to follow Medicare Important Message given?   (If response is "NO", the following Medicare IM given date fields will be blank) Date Medicare IM given:   Date Additional Medicare IM given:    Discharge Disposition:    Per UR Regulation:  Reviewed for med. necessity/level of care/duration of stay  If discussed at Long Length of Stay Meetings, dates discussed:    Comments:  PCP- Hillery Hunter- Norma Fredrickson (niece) [phone]  09/17/11- 1525- Donn Pierini RN, BSN [phone] Spoke with pt at bedside along with pt's niece with permission- per conversation pt states that she lives at home alone is independent with all ADLs and does not use any DME- but she does have a RW and W/C at home if needed. Pt uses local pharmacy for medications ([facility]) and has medication benefits. Pt reports that she will have transportation home. Pt had CT removed today, no anticipated d/c needs. Pt will good support system. NCM to follow.

## 2011-09-17 NOTE — Progress Notes (Signed)
                    [address]            [place] [address]          [phone]     2 Days Post-Op Procedure(s) (LRB): VIDEO ASSISTED THORACOSCOPY (VATS)/WEDGE RESECTION (Right)  Subjective: Sore, otherwise feels well.  Appetite marginal, but no nausea.  Breathing stable.  Objective: Vital signs in last 24 hours: Patient Vitals for the past 24 hrs:  BP Temp Temp src Pulse Resp SpO2  09/17/11 0759 - - - - 12  96 %  09/17/11 0755 128/69 mmHg 98.7 F (37.1 C) Oral 73  14  96 %  09/17/11 0422 135/70 mmHg 98.1 F (36.7 C) Oral 65  13  100 %  09/17/11 0400 - - - - 18  100 %  09/17/11 0000 - - - 68  15  100 %  09/16/11 1937 147/80 mmHg 98.6 F (37 C) Oral 66  13  100 %  09/16/11 1611 130/77 mmHg 98.2 F (36.8 C) Oral 72  14  95 %  09/16/11 1539 - - - - 15  93 %  09/16/11 1200 122/63 mmHg 98.5 F (36.9 C) Oral 58  15  95 %  09/16/11 1136 - - - - 14  96 %  09/16/11 0825 - - - - 16  100 %   Current Weight  09/16/11 117 lb 1 oz (53.1 kg)     Intake/Output from previous day: 05/14 0701 - 05/15 0700 In: 1200 [P.O.:700; I.V.:460] Out: 1430 [Urine:1350; Chest Tube:80]    PHYSICAL EXAM:  Heart: RRR Lungs:slightly decreased on right Wound: dressed and dry Chest tube: no air leak with cough  Lab Results: CBC: Basename 09/17/11 0435 09/16/11 0358  WBC 6.1 7.8  HGB 11.1* 11.8*  HCT 34.0* 35.5*  PLT 159 168   BMET:  Basename 09/17/11 0435 09/16/11 0358  NA 140 137  K 3.6 3.6  CL 108 104  CO2 26 23  GLUCOSE 86 141*  BUN 5* 5*  CREATININE 0.57 0.57  CALCIUM 9.0 8.7    CXR: stable tiny R apical ptx  Assessment/Plan: S/P Procedure(s) (LRB): VIDEO ASSISTED THORACOSCOPY (VATS)/WEDGE RESECTION (Right) Chest tube no air leak, CXR stable.  ? D/c chest tube  Ambulate in halls, continue IS/pulm toilet, wean and d/c O2 as tolerated. Hypokalemia- replace K+.   LOS: 2 days    Desiree Daise H 09/17/2011

## 2011-09-18 ENCOUNTER — Encounter: Payer: Self-pay | Admitting: Radiation Oncology

## 2011-09-18 ENCOUNTER — Encounter (HOSPITAL_COMMUNITY): Admission: RE | Payer: Self-pay | Source: Ambulatory Visit

## 2011-09-18 ENCOUNTER — Inpatient Hospital Stay (HOSPITAL_COMMUNITY): Payer: Medicare Other

## 2011-09-18 ENCOUNTER — Ambulatory Visit (HOSPITAL_COMMUNITY): Admission: RE | Admit: 2011-09-18 | Payer: Medicare Other | Source: Ambulatory Visit | Admitting: Gastroenterology

## 2011-09-18 HISTORY — PX: WEDGE RESECTION: SHX5070

## 2011-09-18 SURGERY — UPPER ENDOSCOPIC ULTRASOUND (EUS) LINEAR
Anesthesia: Monitor Anesthesia Care

## 2011-09-18 MED ORDER — SODIUM CHLORIDE 0.9 % IJ SOLN
INTRAMUSCULAR | Status: AC
  Administered 2011-09-18: 20 mL
  Filled 2011-09-18: qty 30

## 2011-09-18 MED ORDER — LACTULOSE 10 GM/15ML PO SOLN
20.0000 g | Freq: Once | ORAL | Status: AC
  Administered 2011-09-18: 20 g via ORAL
  Filled 2011-09-18: qty 30

## 2011-09-18 MED ORDER — OXYCODONE-ACETAMINOPHEN 5-325 MG PO TABS
1.0000 | ORAL_TABLET | ORAL | Status: AC | PRN
Start: 2011-09-18 — End: 2011-09-28

## 2011-09-18 NOTE — Progress Notes (Addendum)
3 Days Post-Op Procedure(s) (LRB): VIDEO ASSISTED THORACOSCOPY (VATS)/WEDGE RESECTION (Right)  Subjective: Patient feeling fairly well this am. Passing flatus but no bm yet.  Objective: Vital signs in last 24 hours: Patient Vitals for the past 24 hrs:  BP Temp Temp src Pulse Resp SpO2  09/18/11 0427 - 98.2 F (36.8 C) Oral - - -  09/18/11 0400 136/72 mmHg - - 71  22  88 %  09/18/11 0030 - 99.2 F (37.3 C) Oral - - -  09/18/11 0000 127/67 mmHg 99.2 F (37.3 C) Oral 71  24  89 %  09/17/11 2000 123/61 mmHg 98.8 F (37.1 C) Oral 72  23  92 %  09/17/11 1600 131/65 mmHg 98.1 F (36.7 C) Oral 65  22  93 %  09/17/11 1200 127/59 mmHg 97.8 F (36.6 C) Oral 67  20  95 %  09/17/11 1154 - - - - 16  95 %  09/17/11 0759 - - - - 12  96 %  09/17/11 0755 128/69 mmHg 98.7 F (37.1 C) Oral 73  14  96 %    Current Weight  09/16/11 117 lb 1 oz (53.1 kg)      Intake/Output from previous day: 05/15 0701 - 05/16 0700 In: 100 [I.V.:100] Out: 200 [Urine:200]   Physical Exam:  Cardiovascular: RRR, no murmurs, gallops, or rubs. Pulmonary: Clear to auscultation on left;slightly decreased at right base; no rales, wheezes, or rhonchi. Abdomen: Soft, non tender, bowel sounds present. Extremities: Nolower extremity edema. Wounds: Clean and dry.  No erythema or signs of infection.  Lab Results: CBC: Basename 09/17/11 0435 09/16/11 0358  WBC 6.1 7.8  HGB 11.1* 11.8*  HCT 34.0* 35.5*  PLT 159 168   BMET:  Basename 09/17/11 0435 09/16/11 0358  NA 140 137  K 3.6 3.6  CL 108 104  CO2 26 23  GLUCOSE 86 141*  BUN 5* 5*  CREATININE 0.57 0.57  CALCIUM 9.0 8.7    PT/INR: No results found for this basename: LABPROT,INR in the last 72 hours ABG:  INR: Will add last result for INR, ABG once components are confirmed Will add last 4 CBG results once components are confirmed  Assessment/Plan:  1.Pulmonary-Questionable trace right apical ptx, improvement in basilar airspace disease.Check CXR  in am. 2.LOC constipation. 3.GI-not much appetite.No abdominal pain, nausea, or emesis. Has previously tried ensure but it upsets her stomach. 4.Probable discharge in am.  ZIMMERMAN,DONIELLE MPA-C 09/18/2011   chest xray looks good Plan d/c tomorrow Path still not final  I have seen and examined [name] and agree with the above assessment  and plan.  [name] MD Beeper [phone] Office [phone] 09/18/2011 9:13 AM

## 2011-09-18 NOTE — Progress Notes (Signed)
Utilization review completed.  

## 2011-09-18 NOTE — Discharge Summary (Signed)
[address]            [place] [address]          [phone]         Discharge Summary  Name: [name] DOB: 1930-07-11 78 y.o. 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 female with a previous history of T4N2 adenocarcinoma of the stomach which was resected and treated with chemotherapy and radiation. 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 Followup scans showed an enlarging right upper lobe lung nodule. She was referred to Dr. Tyrone Sage for surgical evaluation.  Attempts at [facility] to obtain a tissue diagnosis were unsuccessful, and so a video-assisted thoracoscopy and wedge resection was recommended to the patient. All risks, benefits and alternatives of surgery were explained in detail, and the patient agreed to proceed.    Hospital Course:  The patient was admitted to [facility] on 09/15/2011. The patient was taken to the operating room and underwent the above procedure.    The postoperative course has generally been uneventful. Her chest tube was removed on postop day 2 without problem. Her followup chest x-ray remained stable with only a questionable trace right apical pneumothorax. She is tolerating a regular diet. She is ambulating in the halls without difficulty. She has remained afebrile and her vital signs have been stable. Her final pathology is pending at the time of this dictation. We anticipate discharge home  within the next 24 hours provided her x-ray remained stable and no acute changes occur.    Recent vital signs:  Filed Vitals:   09/18/11 0808  BP: 117/61  Pulse: 70  Temp: 98 F (36.7 C)  Resp: 28    Recent laboratory studies:  CBC: Basename 09/17/11 0435 09/16/11 0358  WBC 6.1 7.8  HGB 11.1* 11.8*  HCT 34.0* 35.5*  PLT 159 168   BMET:  Basename 09/17/11 0435 09/16/11 0358  NA 140 137  K 3.6 3.6  CL 108 104  CO2 26 23  GLUCOSE 86 141*  BUN 5* 5*  CREATININE 0.57 0.57  CALCIUM 9.0 8.7    PT/INR: No results found for this basename: LABPROT,INR in the last 72 hours  Discharge Medications:   Medication List  As of 09/18/2011  8:52 AM   TAKE these medications         albuterol 108 (90 BASE) MCG/ACT inhaler   Commonly known as: PROVENTIL HFA;VENTOLIN HFA   Inhale 3 puffs into the lungs every 4 (four) hours as needed. For shortness of breath      B-12 100 MCG Tabs   Take 1 tablet by mouth as needed.      calcium-vitamin D 500-200 MG-UNIT per tablet   Commonly known as: OSCAL WITH D   Take 1 tablet by mouth every other day.      CENTRUM SILVER PO   Take 1 tablet by mouth daily.  ICAPS PO   Take 1 tablet by mouth every other day.      fish oil-omega-3 fatty acids 1000 MG capsule   Take 1 g by mouth daily.      hydrOXYzine 10 MG tablet   Commonly known as: ATARAX/VISTARIL   Take 10 mg by mouth daily as needed. For itching.      Iron 325 (65 FE) MG Tabs   Take 1 tablet by mouth daily.      lovastatin 20 MG tablet   Commonly known as: MEVACOR   Take 20 mg by mouth daily.      oxyCODONE-acetaminophen 5-325 MG per tablet   Commonly known as: PERCOCET   Take 1-2 tablets by mouth every 4 (four) hours as needed for pain.            Discharge Instructions:  The patient is to refrain from driving, heavy lifting or strenuous activity.  May shower daily and clean incisions with soap and water.  May resume regular diet.  Discharge Orders    Future  Appointments: Provider: Department: Dept Phone: Center:   10/03/2011 10:00 AM Jonna Coup, MD [facility] [phone] None   11/03/2011 11:15 AM Almond Lint, MD [facility] [phone] None      Follow-up Information    Follow up with GERHARDT,EDWARD B, MD. (Office will call to schedule appointment)    Contact information:   [address] [place] [address] [phone]           Natarsha Hurwitz H 09/18/2011, 8:52 AM

## 2011-09-19 ENCOUNTER — Inpatient Hospital Stay (HOSPITAL_COMMUNITY): Payer: Medicare Other

## 2011-09-19 NOTE — Progress Notes (Signed)
Pt d/c per MD order, prescriptions given d/c instructions given, pt VSS, pt family at [facility], pt and family verbalized understanding of d/c instructions and when to f/u to see MD

## 2011-09-19 NOTE — Progress Notes (Addendum)
4 Days Post-Op Procedure(s) (LRB): VIDEO ASSISTED THORACOSCOPY (VATS)/WEDGE RESECTION (Right)  Subjective: Patient had a bowel movement yesterday. Has some soreness at previous right chest tube site.  Objective: Vital signs in last 24 hours: Patient Vitals for the past 24 hrs:  BP Temp Temp src Pulse Resp SpO2  09/19/11 0413 - 98.5 F (36.9 C) Oral - - -  09/19/11 0400 132/63 mmHg - Oral 70  22  94 %  09/19/11 0000 135/75 mmHg 98.5 F (36.9 C) Oral 67  21  93 %  09/18/11 2338 - 98.5 F (36.9 C) Oral - - -  09/18/11 2000 138/76 mmHg 98.7 F (37.1 C) Oral 64  22  96 %  09/18/11 1624 - 97.9 F (36.6 C) Oral - - -  09/18/11 1600 111/81 mmHg 97.9 F (36.6 C) Oral 77  24  98 %  09/18/11 1200 117/68 mmHg 98.1 F (36.7 C) Oral 64  23  95 %  09/18/11 0800 117/76 mmHg 98 F (36.7 C) Oral 76  19  93 %    Current Weight  09/16/11 117 lb 1 oz (53.1 kg)      Intake/Output from previous day: 05/16 0701 - 05/17 0700 In: 740 [P.O.:740] Out: -    Physical Exam:  Cardiovascular: RRR, no murmurs, gallops, or rubs. Pulmonary: Slightly decreased at left base; no rales, wheezes, or rhonchi. Abdomen: Soft, non tender, bowel sounds present. Extremities: No lower extremity edema. Wounds: Clean and dry.  No erythema or signs of infection.  Lab Results: CBC:  Basename 09/17/11 0435  WBC 6.1  HGB 11.1*  HCT 34.0*  PLT 159   BMET:   Basename 09/17/11 0435  NA 140  K 3.6  CL 108  CO2 26  GLUCOSE 86  BUN 5*  CREATININE 0.57  CALCIUM 9.0    PT/INR: No results found for this basename: LABPROT,INR in the last 72 hours ABG:  INR: Will add last result for INR, ABG once components are confirmed Will add last 4 CBG results once components are confirmed  Assessment/Plan:  1.Pulmonary- CXR this am shows stable,small right apical ptx.Small bilateral pleural effusions.Final pathology results still pending. 2.Remove central line. 3.Discharge.  ZIMMERMAN,DONIELLE MPA-C 09/19/2011  7:59 AM   Home today, final path still pending I have seen and examined [name] and agree with the above assessment  and plan.  [name] MD Beeper [phone] Office [phone] 09/19/2011 10:11 AM

## 2011-09-24 LAB — FUNGUS CULTURE W SMEAR
Fungal Smear: NONE SEEN
Fungal Smear: NONE SEEN

## 2011-10-03 ENCOUNTER — Ambulatory Visit
Admission: RE | Admit: 2011-10-03 | Discharge: 2011-10-03 | Disposition: A | Discharge status: Home / Self Care | Payer: Medicare Other | Source: Ambulatory Visit | Attending: Radiation Oncology | Admitting: Radiation Oncology

## 2011-10-03 ENCOUNTER — Encounter: Payer: Self-pay | Admitting: Radiation Oncology

## 2011-10-03 ENCOUNTER — Other Ambulatory Visit: Payer: Self-pay | Admitting: Cardiothoracic Surgery

## 2011-10-03 VITALS — BP 141/94 | HR 80 | Temp 97.3°F | Resp 20 | Wt 109.8 lb

## 2011-10-03 DIAGNOSIS — C160 Malignant neoplasm of cardia: Secondary | ICD-10-CM

## 2011-10-03 DIAGNOSIS — D381 Neoplasm of uncertain behavior of trachea, bronchus and lung: Secondary | ICD-10-CM

## 2011-10-03 NOTE — Progress Notes (Signed)
Patient her f/u gastric ca,  Alert,oriented x3, no c/o pain or discomfort, had on 09/15/11 right lung wedge resection by Dr. Tyrone Sage No c/o nausea, has occasional pheglm clear

## 2011-10-06 ENCOUNTER — Ambulatory Visit (INDEPENDENT_AMBULATORY_CARE_PROVIDER_SITE_OTHER): Payer: Self-pay | Admitting: Physician Assistant

## 2011-10-06 ENCOUNTER — Ambulatory Visit
Admission: RE | Admit: 2011-10-06 | Discharge: 2011-10-06 | Disposition: A | Discharge status: Home / Self Care | Payer: Medicare Other | Source: Ambulatory Visit | Attending: Cardiothoracic Surgery | Admitting: Cardiothoracic Surgery

## 2011-10-06 VITALS — BP 133/75 | HR 68 | Resp 16 | Ht 59.0 in | Wt 109.0 lb

## 2011-10-06 DIAGNOSIS — Z09 Encounter for follow-up examination after completed treatment for conditions other than malignant neoplasm: Secondary | ICD-10-CM

## 2011-10-06 DIAGNOSIS — R911 Solitary pulmonary nodule: Secondary | ICD-10-CM

## 2011-10-06 DIAGNOSIS — D381 Neoplasm of uncertain behavior of trachea, bronchus and lung: Secondary | ICD-10-CM

## 2011-10-06 NOTE — Progress Notes (Addendum)
Radiation Oncology         ([phone]) [phone] ________________________________  Name: [name] MRN: [id number]  Date: 10/06/2011  DOB: 28-Feb-1931  Follow-Up Visit Note  CC: [name], MD, MD  Laurice Record, MD Sheliah Plane, MD  Diagnosis:   Gastric adenocarcinoma  Interval Since Last Radiation:  Approximately 6-7 months   Narrative:  The patient returns today for routine follow-up.  She indicates that she was discharged from the hospital a couple of weeks ago. The patient was found to have a lung tumor and she underwent surgical resection recently. She is following up with Dr. Tyrone Sage next week to review the results of her pathology. I don't see these results in the computer at this time. She indicates that she has been recovering well from this procedure. Her energy level is good. She denies any abdominal complaints at this time with no ongoing GI issues after her treatment.                              ALLERGIES:   has no known allergies.  Meds: Current Outpatient Prescriptions  Medication Sig Dispense Refill  . albuterol (PROVENTIL HFA;VENTOLIN HFA) 108 (90 BASE) MCG/ACT inhaler Inhale 3 puffs into the lungs every 4 (four) hours as needed. For shortness of breath       . calcium-vitamin D (OSCAL WITH D) 500-200 MG-UNIT per tablet Take 1 tablet by mouth every other day.       . Cyanocobalamin (B-12) 100 MCG TABS Take 1 tablet by mouth as needed.       . Ferrous Sulfate (IRON) 325 (65 FE) MG TABS Take 1 tablet by mouth daily.       . fish oil-omega-3 fatty acids 1000 MG capsule Take 1 g by mouth daily.       . hydrOXYzine (ATARAX) 10 MG tablet Take 10 mg by mouth daily as needed. For itching.      . lovastatin (MEVACOR) 20 MG tablet Take 20 mg by mouth daily.        . Multiple Vitamins-Minerals (CENTRUM SILVER PO) Take 1 tablet by mouth daily.       . Multiple Vitamins-Minerals (ICAPS PO) Take 1 tablet by mouth every other day.         Physical Findings: The patient  is in no acute distress. Patient is alert and oriented.  weight is 109 lb 12.8 oz (49.805 kg). Her oral temperature is 97.3 F (36.3 C). Her blood pressure is 141/94 and her pulse is 80. Her respiration is 20. Marland Kitchen   General: Well-developed, in no acute distress HEENT: Normocephalic, atraumatic Cardiovascular: Regular rate and rhythm Respiratory: Clear to auscultation bilaterally GI: Soft, nontender, normal bowel sounds Extremities: No edema present   Lab Findings: Lab Results  Component Value Date   WBC 6.1 09/17/2011   HGB 11.1* 09/17/2011   HCT 34.0* 09/17/2011   MCV 86.5 09/17/2011   PLT 159 09/17/2011     Radiographic Findings: Dg Chest 2 View  10/06/2011  *RADIOLOGY REPORT*  Clinical Data: Postop.  Shortness of breath.  CHEST - 2 VIEW  Comparison: 09/19/2011  Findings: Previously seen right pneumothorax has resolved.  Small bilateral pleural effusions are stable.  Minimal bibasilar atelectasis.  Underlying COPD changes.  Heart is normal size.  IMPRESSION: Interval resolution of the right pneumothorax.  Residual small bilateral pleural effusions and bibasilar atelectasis.  Original Report Authenticated By: Cyndie Chime, M.D.  Dg Chest 2 View  09/19/2011  *RADIOLOGY REPORT*  Clinical Data: 78 year old female with right side pneumothorax. Shortness of breath.  CHEST - 2 VIEW  Comparison: 09/18/2011 and earlier.  Findings: Stable small right apical pneumothorax.  Small component of right pleural fluid.  Small left pleural effusion also is stable.  No new pulmonary edema.  Stable right IJ central line. Stable cardiac size and mediastinal contours.  Mild residual bibasilar reticulonodular opacity.  No new airspace disease. Stable visualized osseous structures.  IMPRESSION: 1.  Stable small right pneumothorax/hydropneumothorax. 2.  Stable small left effusion. 3.  No new cardiopulmonary abnormality.  Original Report Authenticated By: Harley Hallmark, M.D.   Dg Chest 2 View  09/18/2011   *RADIOLOGY REPORT*  Clinical Data: Right pneumothorax, shortness of breath.  CHEST - 2 VIEW  Comparison: 09/17/2011  Findings: Right pneumothorax again noted, stable.  COPD.  Improving aeration in the lung bases.  Small effusions and bibasilar atelectasis persist.  Heart is normal size.  IMPRESSION: Stable small right apical pneumothorax following chest tube removal.  COPD.  Improving aeration in the lung bases with residual bibasilar atelectasis and small effusions.  Original Report Authenticated By: Cyndie Chime, M.D.   Dg Chest 2 View  09/12/2011  *RADIOLOGY REPORT*  Clinical Data: Thoracic neoplasm.  COPD.  Short of breath.  Lung mass.  History of gastric cancer.  CHEST - 2 VIEW  Comparison: 08/28/2011.  Findings: Emphysema is present.  Basilar atelectasis.  Small bilateral pleural effusions are probably present superimposed on chronic flattening of hemidiaphragms.  Prominence of the pulmonary hila suggesting pulmonary arterial hypertension.  The right upper lobe nodule appears little changed compared to prior exams, abutting the right paratracheal stripe.  There is no airspace consolidation.  Compared to most recent prior, improved pulmonary aeration.  IMPRESSION:  1.  Unchanged right paratracheal pulmonary nodule. 2.  Emphysema and basilar atelectasis.  Probable pulmonary arterial hypertension.  Original Report Authenticated By: Andreas Newport, M.D.   Dg Chest Port 1 View  09/17/2011  *RADIOLOGY REPORT*  Clinical Data: Chest tube.  PORTABLE CHEST - 1 VIEW  Comparison: Plain film chest 09/16/2011 and 09/15/2011.  Findings: Right chest tube and right IJ catheter remain in place. Tiny right apical pneumothorax again seen.  Bibasilar airspace disease, worse on the right, persists.  Heart size normal.  IMPRESSION: No interval change.  Original Report Authenticated By: Bernadene Bell. Maricela Curet, M.D.   Dg Chest Port 1 View  09/16/2011  *RADIOLOGY REPORT*  Clinical Data: Status post VATS for right upper lobe  lesion.  PORTABLE CHEST - 1 VIEW  Comparison: 09/15/2011  Findings:  Right chest tube remains in good position.  Mild volume loss both bases.  Slight bilateral effusions.  Tiny less than 5% right apical pneumothorax (arrow) is redemonstrated.  Cardiac size remains mildly increased.  IMPRESSION: Right chest tube good position.  Tiny right apical pneumothorax. Mild bibasilar volume loss.  Original Report Authenticated By: Elsie Stain, M.D.   Dg Chest Portable 1 View  09/15/2011  *RADIOLOGY REPORT*  Clinical Data: Postop VATS.  PORTABLE CHEST - 1 VIEW  Comparison: 09/12/2011  Findings: Right central line tip is in the SVC.  Right chest tube is in place with postoperative changes in the right upper lobe. Possible tiny right apical pneumothorax.  Bibasilar atelectasis and/or scarring.  Heart is upper limits normal in size.  Diffuse interstitial prominence, likely chronic interstitial lung disease, stable.  IMPRESSION: Postoperative changes on the right.  Support devices as above.  Suspect tiny right apical pneumothorax.  Original Report Authenticated By: Cyndie Chime, M.D.    Impression:    78 year old female status post treatment for a gastric adenocarcinoma. The patient has also more recently undergone surgical resection for a lung tumor. Her postop visit is pending.  Plan:  I please with how she is doing and she is not having any ongoing GI issues. It sounds like the surgery went well and I don't see any need for radiation treatment for the for serial future. I will have her return to our clinic on a when necessary basis. The patient does know to contact our office if we can be any further assistance.  I spent 10 minutes with the patient today, the majority of which was spent counseling the patient on the diagnosis of cancer and coordinating care.   Radene Gunning, M.D., Ph.D.

## 2011-10-06 NOTE — Progress Notes (Signed)
Encounter addended by: Jonna Coup, MD on: 10/06/2011  5:48 PM<BR>     Documentation filed: Visit Diagnoses, Notes Section

## 2011-10-06 NOTE — Progress Notes (Signed)
  HPI: Patient returns for routine postoperative follow-up having undergone Right VATS with Right Upper Lobectomy on 09/15/2011.  The patient's early postoperative recovery while in the hospital was notable for a small pneumothorax on the right.  Since hospital discharge the patient reports she is doing very well.  She denies any episodes of shortness of breath.  She does complain of some minor soreness which she states she figured was expected.  She is ambulating without difficulty and has resumed her normal activity level with no problems.  Current Outpatient Prescriptions  Medication Sig Dispense Refill  . albuterol (PROVENTIL HFA;VENTOLIN HFA) 108 (90 BASE) MCG/ACT inhaler Inhale 3 puffs into the lungs every 4 (four) hours as needed. For shortness of breath       . calcium-vitamin D (OSCAL WITH D) 500-200 MG-UNIT per tablet Take 1 tablet by mouth every other day.       . Cyanocobalamin (B-12) 100 MCG TABS Take 1 tablet by mouth as needed.       . Ferrous Sulfate (IRON) 325 (65 FE) MG TABS Take 1 tablet by mouth daily.       . fish oil-omega-3 fatty acids 1000 MG capsule Take 1 g by mouth daily.       . hydrOXYzine (ATARAX) 10 MG tablet Take 10 mg by mouth daily as needed. For itching.      . lovastatin (MEVACOR) 20 MG tablet Take 20 mg by mouth daily.        . Multiple Vitamins-Minerals (CENTRUM SILVER PO) Take 1 tablet by mouth daily.       . Multiple Vitamins-Minerals (ICAPS PO) Take 1 tablet by mouth every other day.         Physical Exam:  BP 133/75  Pulse 68  Resp 16  Ht 4\' 11"  (1.499 m)  Wt 109 lb (49.442 kg)  BMI 22.02 kg/m2  SpO2 94%  Gen: no apparent distress Lungs: CTA bilaterally Heart: RRR Skin: incisions well healed, no acute evidence of infection  Diagnostic Tests:  CXR: resolution of pneumothorax from previous film, minimal left sided pleural effusion, bibasilar atelectasis   Impression:  [name] is S/P VATS with RUL Lobectomy doing very well.  She is not  experiencing any acute problems.  Plan:  RTC in 1 month for follow up with Dr. Tyrone Sage.

## 2011-10-08 ENCOUNTER — Other Ambulatory Visit: Payer: Self-pay | Admitting: Hematology and Oncology

## 2011-10-10 LAB — AFB CULTURE WITH SMEAR (NOT AT ARMC)
Acid Fast Smear: NONE SEEN
Acid Fast Smear: NONE SEEN

## 2011-10-31 ENCOUNTER — Other Ambulatory Visit: Payer: Self-pay | Admitting: Cardiothoracic Surgery

## 2011-10-31 DIAGNOSIS — D381 Neoplasm of uncertain behavior of trachea, bronchus and lung: Secondary | ICD-10-CM

## 2011-11-03 ENCOUNTER — Ambulatory Visit (INDEPENDENT_AMBULATORY_CARE_PROVIDER_SITE_OTHER): Payer: Medicare Other | Admitting: General Surgery

## 2011-11-03 ENCOUNTER — Encounter (INDEPENDENT_AMBULATORY_CARE_PROVIDER_SITE_OTHER): Payer: Self-pay | Admitting: General Surgery

## 2011-11-03 VITALS — BP 122/84 | HR 54 | Temp 98.2°F | Resp 12 | Ht 59.0 in | Wt 111.0 lb

## 2011-11-03 DIAGNOSIS — C160 Malignant neoplasm of cardia: Secondary | ICD-10-CM

## 2011-11-03 NOTE — Assessment & Plan Note (Signed)
Pt with complaints of dysphagia to solid foods.  Will refer back to Dr. Laural Benes for EGD to rule out stricture or recurrent cancer.    Also, pt having loose stools.  This is uncommon after proximal gastrectomy.  Will ask Dr. Laural Benes to eval this as well.

## 2011-11-03 NOTE — Progress Notes (Signed)
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  She was able to have resection fully in the abdomen.  When I saw her last, I pulled her J tube as her weight was stable.  Since that time, she had lung mass found and resected by VATS by Dr. Tyrone Sage.  She is now complaining of some abdominal pain and sense of a "catch" in her lower chest when she eats some solids. She denies nausea or vomiting.     PERTINENT REVIEW OF SYSTEMS: Otherwise negative.     EXAM: Head: Normocephalic and atraumatic.  Eyes:  Conjunctivae are normal. Pupils are equal, round, and reactive to light. No scleral icterus.  Neck:  Normal range of motion. Neck supple. No tracheal deviation present. No thyromegaly present.  Resp: No respiratory distress, normal effort. Abd:  Abdomen is soft, non distended and non tender. No masses are palpable.  There is no rebound and no guarding.  Scar is non tender.  No evidence of hernia.   Neurological: Alert and oriented to person, place, and time. Coordination normal.  Skin: Skin is warm and dry. No rash noted. No diaphoretic. No erythema. No pallor.  Psychiatric: Normal mood and affect. Normal behavior. Judgment and thought content normal.      ASSESSMENT AND PLAN:   Gastroesophageal cancer Pt with complaints of dysphagia to solid foods.  Will refer back to Dr. Laural Benes for EGD to rule out stricture or recurrent cancer.    Also, pt having loose stools.  This is uncommon after proximal gastrectomy.  Will ask Dr. Laural Benes to eval this as well.        Maudry Diego, MD Surgical Oncology, General & Endocrine Surgery [facility]  [name], MD [name], MD

## 2011-11-03 NOTE — Patient Instructions (Signed)
We will refer to Dr. Laural Benes to do endoscopy and to evaluate your loose stools.  If endoscopy is negative, would do CT scan.  Will determine follow up after testing.

## 2011-11-13 ENCOUNTER — Encounter: Payer: Self-pay | Admitting: Cardiothoracic Surgery

## 2011-11-13 ENCOUNTER — Ambulatory Visit (INDEPENDENT_AMBULATORY_CARE_PROVIDER_SITE_OTHER): Payer: Self-pay | Admitting: Cardiothoracic Surgery

## 2011-11-13 ENCOUNTER — Ambulatory Visit
Admission: RE | Admit: 2011-11-13 | Discharge: 2011-11-13 | Disposition: A | Discharge status: Home / Self Care | Payer: Medicare Other | Source: Ambulatory Visit | Attending: Cardiothoracic Surgery | Admitting: Cardiothoracic Surgery

## 2011-11-13 VITALS — BP 105/69 | HR 80 | Resp 18 | Ht 59.0 in | Wt 108.0 lb

## 2011-11-13 DIAGNOSIS — R911 Solitary pulmonary nodule: Secondary | ICD-10-CM

## 2011-11-13 DIAGNOSIS — D381 Neoplasm of uncertain behavior of trachea, bronchus and lung: Secondary | ICD-10-CM

## 2011-11-13 DIAGNOSIS — Z09 Encounter for follow-up examination after completed treatment for conditions other than malignant neoplasm: Secondary | ICD-10-CM

## 2011-11-13 NOTE — Patient Instructions (Signed)
Follow up CT of Chest in November 2013

## 2011-11-13 NOTE — Progress Notes (Signed)
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 She has had some swallowing difficulties in his to have a repeat endoscopy in the near future range by Dr. Donell Beers. She denies any hemoptysis or shortness of breath.   History  Smoking status  . Former Smoker -- 0.2 packs/day for 30 years  . Types: Cigarettes  . Quit date: 05/16/1988  Smokeless tobacco  . Never Used       No Known Allergies  Current Outpatient Prescriptions  Medication Sig Dispense Refill  . albuterol (PROVENTIL HFA;VENTOLIN HFA) 108 (90 BASE) MCG/ACT inhaler Inhale 3 puffs into the lungs every 4 (four) hours as needed. For shortness of breath       . calcium-vitamin D (OSCAL WITH D) 500-200 MG-UNIT per tablet Take 1 tablet by mouth every other day.       . Cyanocobalamin (B-12) 100 MCG TABS Take 1 tablet by mouth as needed.       . Ferrous Sulfate (IRON) 325 (65 FE) MG TABS Take 1 tablet by mouth daily.       . fish oil-omega-3 fatty acids 1000 MG capsule Take 1 g by mouth daily.       . hydrOXYzine (ATARAX) 10 MG tablet Take 10 mg by mouth daily as needed. For itching.      . lovastatin (MEVACOR) 20 MG tablet Take 20 mg by mouth daily.        . Multiple Vitamins-Minerals (CENTRUM SILVER PO) Take 1 tablet by mouth daily.       . Multiple Vitamins-Minerals (ICAPS PO) Take 1 tablet by mouth every other day.            Physical Exam: BP 105/69  Pulse 80  Resp 18  Ht 4\' 11"  (1.499 m)  Wt 108 lb (48.988 kg)  BMI 21.81 kg/m2  SpO2 93%  General appearance: alert, cooperative and no distress Neurologic: intact Heart: regular  rate and rhythm, S1, S2 normal, no murmur, click, rub or gallop and normal apical impulse Lungs: clear to auscultation bilaterally Abdomen: soft, non-tender; bowel sounds normal; no masses,  no organomegaly Extremities: extremities normal, atraumatic, no cyanosis or edema and Homans sign is negative, no sign of DVT Wound: all well healed No cervical or supraclavicular or axillary adenopathy appreciated  Diagnostic Studies & Laboratory data:         Recent Radiology Findings: Dg Chest 2 View  11/13/2011  *RADIOLOGY REPORT*  Clinical Data: Post lung surgery.  4-week follow-up appointment.  CHEST - 2 VIEW  Comparison: 10/06/2011.  Findings: Question very small right apical pneumothorax?  Post right upper lung surgery.  Blunting costophrenic angles stable.  Calcified aorta.  Heart size within normal limits.  IMPRESSION: Question very small right apical pneumothorax?  This is a call report.  Original Report Authenticated By: Fuller Canada, M.D.      Recent Labs: Lab Results  Component Value Date   WBC  6.1 09/17/2011   HGB 11.1* 09/17/2011   HCT 34.0* 09/17/2011   PLT 159 09/17/2011   GLUCOSE 86 09/17/2011   ALT 37* 09/17/2011   AST 46* 09/17/2011   NA 140 09/17/2011   K 3.6 09/17/2011   CL 108 09/17/2011   CREATININE 0.57 09/17/2011   BUN 5* 09/17/2011   CO2 26 09/17/2011   INR 0.93 09/12/2011    Lung, wedge biopsy/resection, Right upper - SPINDLE CELL CARCINOMA. - MARGINS UNINVOLVED BY CARCINOMA. - SEE COMMENT. Microscopic Comment LUNG Specimen, including laterality: Right lung Procedure: Wedge resection Specimen integrity (intact/disrupted): Intact Tumor site: Upper lobe Tumor focality: Single focus Maximum tumor size (cm): 3.8 cm Histologic type: Spindle cell Grade: G4 (undifferentiated). Margins: Uninvolved by carcinoma Visceral pleura invasion: Present Tumor extension: Involves pulmonary parenchyma and visceral pleura Treatment effect (if treated with neoadjuvant therapy):  N/A Lymph -Vascular invasion: Present Lymph nodes: None submitted TNM code: pT2a, pNX Ancillary studies: None performed Non-neoplastic lung: N/A Comments: Sections from the lesion show a spindle cell neoplasm with focal areas of necrosis and occasional atypical mitotic figures. The single cells have a somewhat intersecting fascicle appearance while in other areas the spindle cells appear to be in a more myxoid background. A panel of immunostains was performed in an attempt to further characterize this neoplasm with the following results: Smooth muscle actin - strongly positive, diffuse. Desmin - negative Calponin - negative Smooth muscle mycin - rare positive staining    Assessment / Plan:     Patient doing well following resection of spindle cell tumor right upper lobe.  She looks stronger than she did even preoperatively.   Chest x-ray today is adequate without evidence of recurrence. We'll plan to see her back in 6 months from her surgery with a followup CT scan of the chest.      [name] MD 11/13/2011 10:42 AM

## 2011-11-17 ENCOUNTER — Other Ambulatory Visit: Payer: Self-pay | Admitting: Gastroenterology

## 2011-11-17 DIAGNOSIS — R1310 Dysphagia, unspecified: Secondary | ICD-10-CM

## 2011-11-18 ENCOUNTER — Other Ambulatory Visit: Payer: Self-pay | Admitting: *Deleted

## 2011-11-18 DIAGNOSIS — C160 Malignant neoplasm of cardia: Secondary | ICD-10-CM

## 2011-11-19 ENCOUNTER — Other Ambulatory Visit: Payer: Medicare Other

## 2011-11-19 ENCOUNTER — Ambulatory Visit: Payer: Medicare Other

## 2011-11-19 ENCOUNTER — Ambulatory Visit
Admission: RE | Admit: 2011-11-19 | Discharge: 2011-11-19 | Disposition: A | Discharge status: Home / Self Care | Payer: Medicare Other | Source: Ambulatory Visit | Attending: Gastroenterology | Admitting: Gastroenterology

## 2011-11-19 ENCOUNTER — Telehealth: Payer: Self-pay | Admitting: Hematology and Oncology

## 2011-11-19 DIAGNOSIS — R1310 Dysphagia, unspecified: Secondary | ICD-10-CM

## 2011-11-19 NOTE — Telephone Encounter (Signed)
Per 7/16 pof call pt's niece Norma Fredrickson w/appts @ [phone]. Called the number given but was told I had the wrong number. Called niece @ mobile number listed in EPIC for her [phone]) and lmonvm re appts for lb/scan 8/23 and LO 8/27. Also mailed schedule.

## 2011-12-25 ENCOUNTER — Telehealth: Payer: Self-pay | Admitting: *Deleted

## 2011-12-25 NOTE — Telephone Encounter (Signed)
Pt called requesting to cancel appts.   Spoke with pt and was informed that pt is going to court and will not know how long it will be .   Instructed pt to call office when pt is through with court issue, and appts will be rescheduled for pt.   Pt voiced understanding. Pt's   Phone    [phone].

## 2011-12-26 ENCOUNTER — Inpatient Hospital Stay (HOSPITAL_COMMUNITY): Admission: RE | Admit: 2011-12-26 | Payer: Medicare Other | Source: Ambulatory Visit

## 2011-12-26 ENCOUNTER — Other Ambulatory Visit: Payer: Medicare Other | Admitting: Lab

## 2011-12-26 ENCOUNTER — Telehealth: Payer: Self-pay | Admitting: *Deleted

## 2011-12-26 ENCOUNTER — Other Ambulatory Visit (HOSPITAL_COMMUNITY): Payer: Medicare Other

## 2011-12-26 NOTE — Telephone Encounter (Signed)
Received message from [facility] in radiology re:  Pt  FTKA for CT scans today.   Called niece  Norma Fredrickson on cell phone and left message on voice mail re:  Have pt rescheduled  CT scans soon prior to f/u appt with md on 12/30/11.   Asked Annabelle Harman to call office and confirmed that she received nurse's message. [name]'s   Cell   Phone    [phone].

## 2011-12-30 ENCOUNTER — Ambulatory Visit: Payer: Medicare Other | Admitting: Hematology and Oncology

## 2012-02-17 ENCOUNTER — Other Ambulatory Visit: Payer: Self-pay | Admitting: Cardiothoracic Surgery

## 2012-02-17 DIAGNOSIS — D381 Neoplasm of uncertain behavior of trachea, bronchus and lung: Secondary | ICD-10-CM

## 2012-03-08 ENCOUNTER — Other Ambulatory Visit: Payer: Self-pay | Admitting: Surgery

## 2012-03-25 ENCOUNTER — Encounter: Payer: Self-pay | Admitting: Cardiothoracic Surgery

## 2012-03-25 ENCOUNTER — Ambulatory Visit
Admission: RE | Admit: 2012-03-25 | Discharge: 2012-03-25 | Disposition: A | Discharge status: Home / Self Care | Payer: Medicare Other | Source: Ambulatory Visit | Attending: Cardiothoracic Surgery | Admitting: Cardiothoracic Surgery

## 2012-03-25 ENCOUNTER — Ambulatory Visit (INDEPENDENT_AMBULATORY_CARE_PROVIDER_SITE_OTHER): Payer: Medicare Other | Admitting: Cardiothoracic Surgery

## 2012-03-25 VITALS — BP 118/76 | HR 76 | Resp 18 | Ht 59.0 in | Wt 108.0 lb

## 2012-03-25 DIAGNOSIS — Z902 Acquired absence of lung [part of]: Secondary | ICD-10-CM

## 2012-03-25 DIAGNOSIS — R911 Solitary pulmonary nodule: Secondary | ICD-10-CM

## 2012-03-25 DIAGNOSIS — Z9889 Other specified postprocedural states: Secondary | ICD-10-CM

## 2012-03-25 DIAGNOSIS — Z85118 Personal history of other malignant neoplasm of bronchus and lung: Secondary | ICD-10-CM

## 2012-03-25 DIAGNOSIS — D381 Neoplasm of uncertain behavior of trachea, bronchus and lung: Secondary | ICD-10-CM

## 2012-03-25 DIAGNOSIS — Z09 Encounter for follow-up examination after completed treatment for conditions other than malignant neoplasm: Secondary | ICD-10-CM

## 2012-03-25 DIAGNOSIS — C801 Malignant (primary) neoplasm, unspecified: Secondary | ICD-10-CM

## 2012-03-25 NOTE — Patient Instructions (Signed)
Doing well postop  No evidence on ct scan of recurrent pulmonary mass Will see back in 6 months with follow up ct of chest

## 2012-03-25 NOTE — Progress Notes (Signed)
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 Recently says was dx with shingles on left flank. Says she had shingles vaccination couple years ago, not documented  in EPIC   History  Smoking status  . Former Smoker -- 0.2 packs/day for 30 years  . Types: Cigarettes  . Quit date: 05/16/1988  Smokeless tobacco  . Never Used       No Known Allergies  Current Outpatient Prescriptions  Medication Sig Dispense Refill  . albuterol (PROVENTIL HFA;VENTOLIN HFA) 108 (90 BASE) MCG/ACT inhaler Inhale 3 puffs into the lungs every 4 (four) hours as needed. For shortness of breath       . calcium-vitamin D (OSCAL WITH D) 500-200 MG-UNIT per tablet Take 1 tablet by mouth every other day.       . Cyanocobalamin (B-12) 100 MCG TABS Take 1 tablet by mouth as needed.       . Ferrous Sulfate (IRON) 325 (65 FE) MG TABS Take 1 tablet by mouth daily.       . fish oil-omega-3 fatty acids 1000 MG capsule Take 1 g by mouth daily.       . fluocinonide cream (LIDEX) 0.05 % Apply 1 application topically 2 (two) times daily.       . hydrOXYzine (ATARAX) 10 MG tablet Take 10 mg by mouth daily as needed. For itching.      . lovastatin (MEVACOR) 20 MG tablet Take 20 mg by mouth daily.        . Multiple Vitamins-Minerals (CENTRUM SILVER PO) Take 1 tablet by mouth daily.       . Multiple Vitamins-Minerals (ICAPS PO) Take 1 tablet by mouth every other day.       . valACYclovir (VALTREX) 1000 MG tablet Take 1,000 mg by mouth 3 (three) times daily.            Physical Exam: BP 118/76  Pulse 76  Resp 18  Ht 4\' 11"  (1.499 m)  Wt 108 lb  (48.988 kg)  BMI 21.81 kg/m2  SpO2 94%  General appearance: alert, cooperative and no distress Neurologic: intact Heart: regular rate and rhythm, S1, S2 normal, no murmur, click, rub or gallop and normal apical impulse Lungs: clear to auscultation bilaterally Abdomen: soft, non-tender; bowel sounds normal; no masses,  no organomegaly Extremities: extremities normal, atraumatic, no cyanosis or edema and Homans sign is negative, no sign of DVT Wound: all well healed No cervical or supraclavicular or axillary adenopathy appreciated  Diagnostic Studies & Laboratory data:         Recent Radiology Findings: Ct Chest Wo Contrast  03/25/2012  *RADIOLOGY REPORT*  Clinical Data: 78 year old female status post right upper lung wedge resection in Sep 22, 2011 for lung mass thought by Pathology to represent a lung primary (spindle cell carcinoma type).  The patient has a history of gastric cancer/GIST tumor.  CT CHEST WITHOUT CONTRAST  Technique:  Multidetector CT imaging  of the chest was performed following the standard protocol without IV contrast.  Comparison: Preoperative PET-CT 08/11/2011.  Preoperative chest CT with contrast 07/23/2011.  Findings: Staple line in the medial right lung apex.  No adjacent lung mass.  The postoperative change extends to the margin of the mediastinum.  No right paratracheal mass or mediastinal mass identified in the absence of IV contrast.  No abnormality at the thoracic inlet.  Small area of retained secretions just below the vocal folds. Otherwise major airway is are patent.  Stable lung parenchyma outside the area of wedge resection, with lung base bronchiectasis and chronic scarring or atelectasis.  Underlying centrilobular emphysema.  Postoperative changes to the stomach again noted.  Noncontrast visualized upper abdominal viscera are grossly stable.  Scattered atherosclerosis, including coronary artery involvement. No axillary lymphadenopathy.  Osteopenia.  No acute or  suspicious osseous lesion.  IMPRESSION: 1.  Satisfactory postoperative appearance status post right upper lung wedge resection for lung mass.  This study will serve as a new postoperative baseline. 2.  No mediastinal lymphadenopathy or abnormality identified in the absence contrast. 3.  Postoperative changes to the stomach again noted.   Original Report Authenticated By: Erskine Speed, M.D.       Recent Labs: Lab Results  Component Value Date   WBC 6.1 09/17/2011   HGB 11.1* 09/17/2011   HCT 34.0* 09/17/2011   PLT 159 09/17/2011   GLUCOSE 86 09/17/2011   ALT 37* 09/17/2011   AST 46* 09/17/2011   NA 140 09/17/2011   K 3.6 09/17/2011   CL 108 09/17/2011   CREATININE 0.57 09/17/2011   BUN 5* 09/17/2011   CO2 26 09/17/2011   INR 0.93 09/12/2011    Lung, wedge biopsy/resection, Right upper - SPINDLE CELL CARCINOMA. - MARGINS UNINVOLVED BY CARCINOMA. - SEE COMMENT. Microscopic Comment LUNG Specimen, including laterality: Right lung Procedure: Wedge resection Specimen integrity (intact/disrupted): Intact Tumor site: Upper lobe Tumor focality: Single focus Maximum tumor size (cm): 3.8 cm Histologic type: Spindle cell Grade: G4 (undifferentiated). Margins: Uninvolved by carcinoma Visceral pleura invasion: Present Tumor extension: Involves pulmonary parenchyma and visceral pleura Treatment effect (if treated with neoadjuvant therapy): N/A Lymph -Vascular invasion: Present Lymph nodes: None submitted TNM code: pT2a, pNX Ancillary studies: None performed Non-neoplastic lung: N/A Comments: Sections from the lesion show a spindle cell neoplasm with focal areas of necrosis and occasional atypical mitotic figures. The single cells have a somewhat intersecting fascicle appearance while in other areas the spindle cells appear to be in a more myxoid background. A panel of immunostains was performed in an attempt to further characterize this neoplasm with the following results: Smooth muscle actin -  strongly positive, diffuse. Desmin - negative Calponin - negative Smooth muscle mycin - rare positive staining    Assessment / Plan:    Doing well postop  No evidence on ct scan of recurrent pulmonary mass Will see back in 6 months with follow up ct of chest      [name] MD 03/25/2012 10:29 AM

## 2012-04-19 IMAGING — CT CT ABD-PELV W/ CM
2 of 5 series · 16 of 46 positions shown, 18 images · IV contrast (agent unspecified)
Comparison: 09/25/2010

CT CHEST

CLINICAL DATA: Rule out abscess

CT CHEST, ABDOMEN AND PELVIS WITH CONTRAST
TECHNIQUE: Multidetector CT imaging of the chest, abdomen and
pelvis was performed following the standard protocol during bolus
administration of intravenous contrast.
Contrast: 100 ml of omni 300

[Series 2: cap with st · axial · 0.73mm/px · z∈[-541,-21]mm · 13 of 118 slices shown, 15 images]
[im 7/118  soft-tissue]
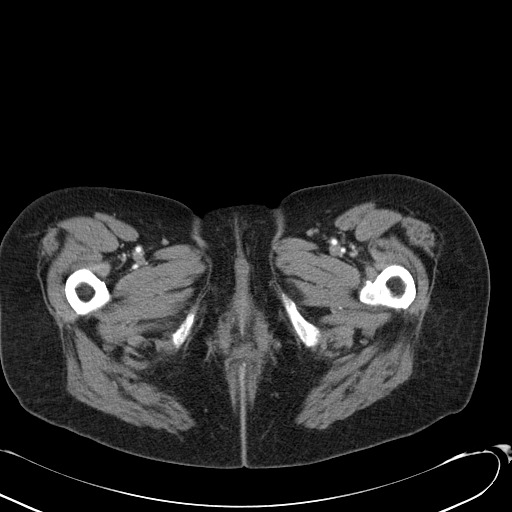
[im 7/118  bone]
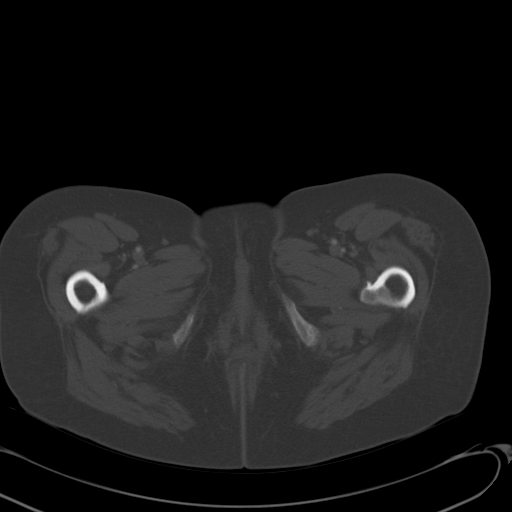
[im 14/118  soft-tissue]
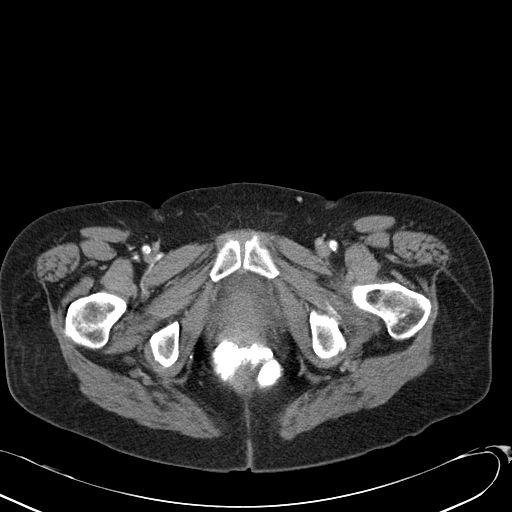
[im 27/118  soft-tissue]
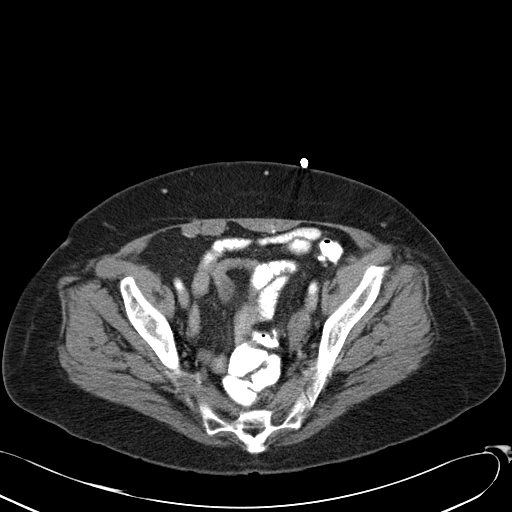
[im 33/118  soft-tissue]
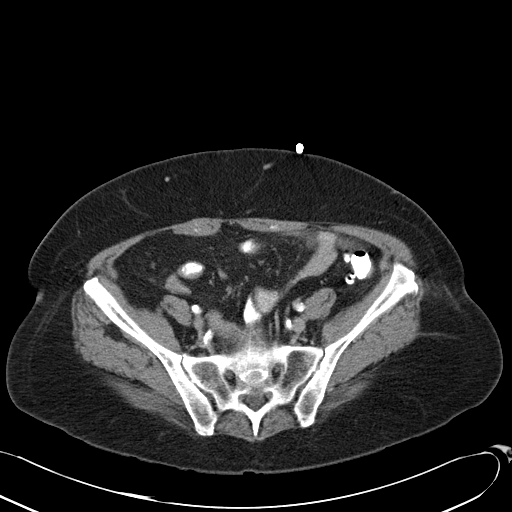
[im 40/118  soft-tissue]
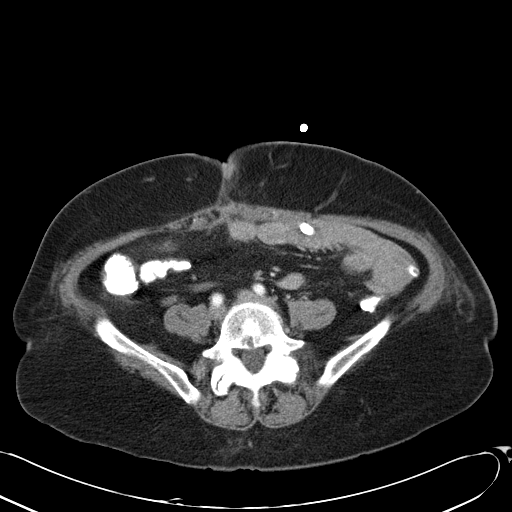
[im 53/118  soft-tissue]
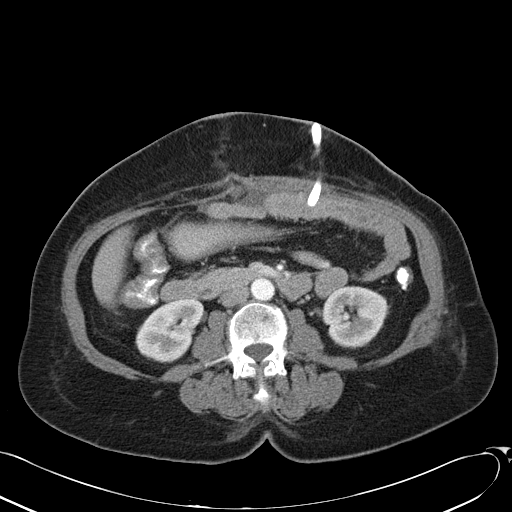
[im 59/118  soft-tissue]
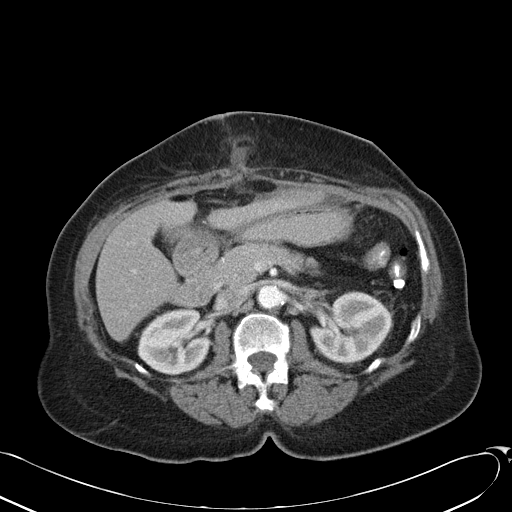
[im 66/118  soft-tissue]
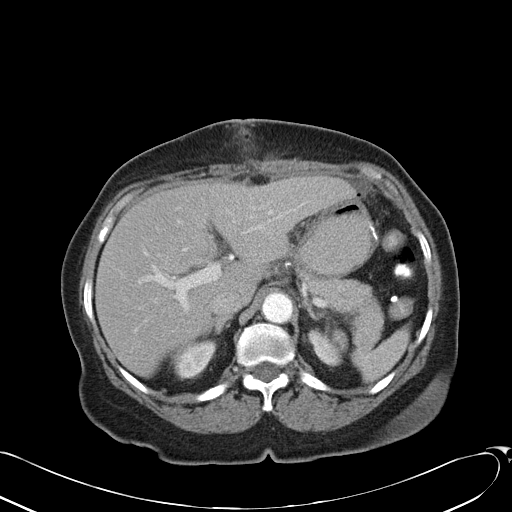
[im 79/118  soft-tissue]
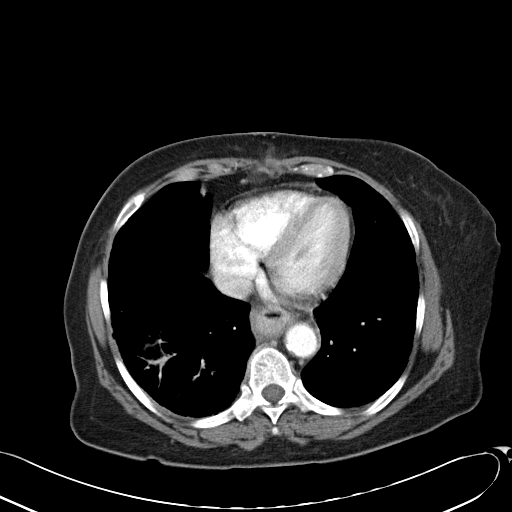
[im 79/118  bone]
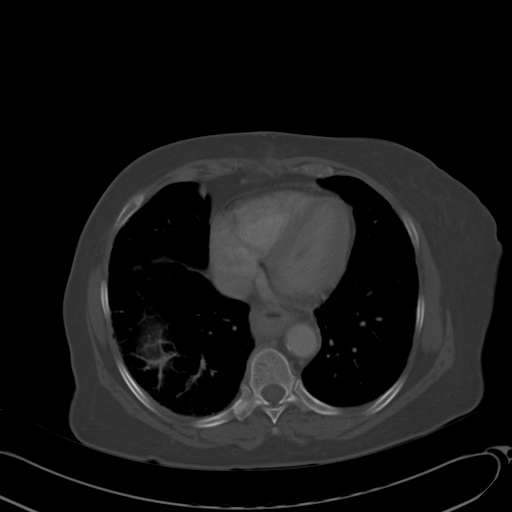
[im 85/118  soft-tissue]
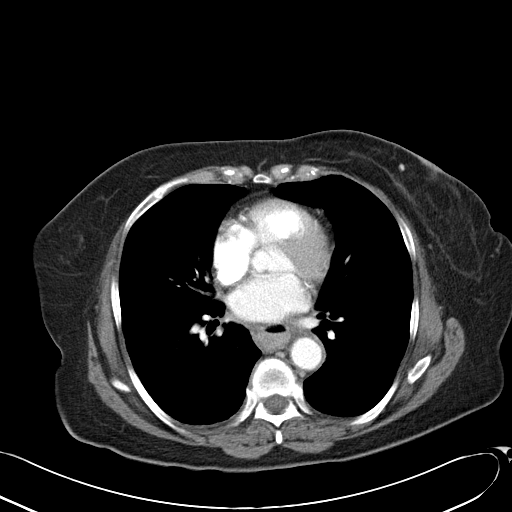
[im 92/118  soft-tissue]
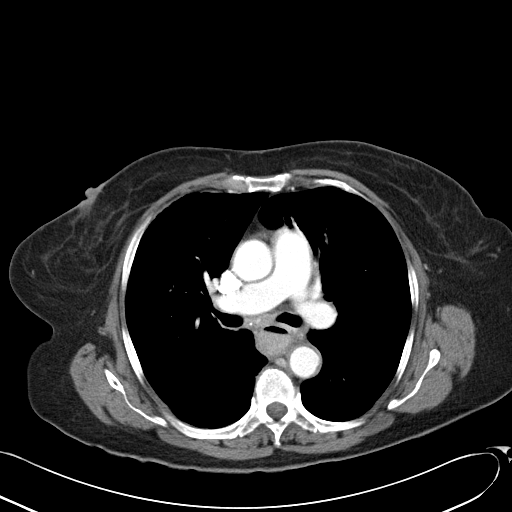
[im 105/118  soft-tissue]
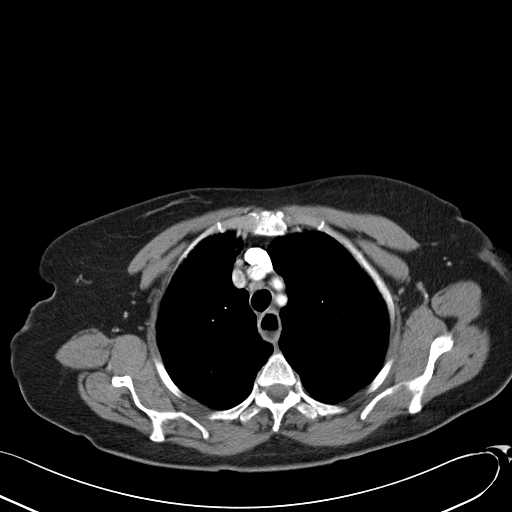
[im 111/118  soft-tissue]
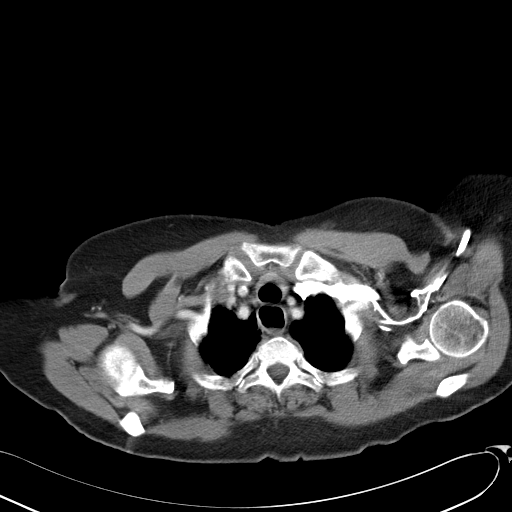

[Series 602: coronal cap · coronal · 1.15mm/px · 3 of 92 slices shown]
[im 31/92  soft-tissue]
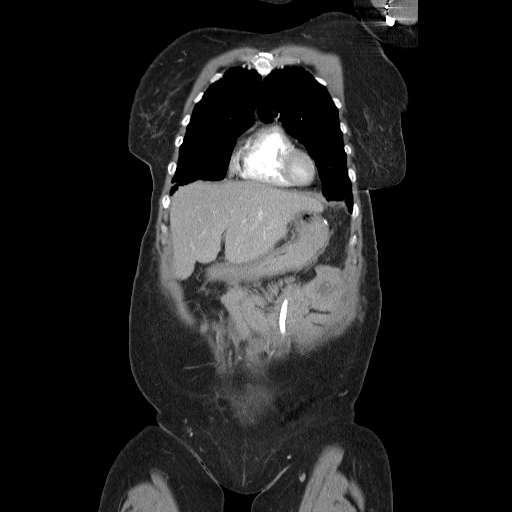
[im 41/92  soft-tissue]
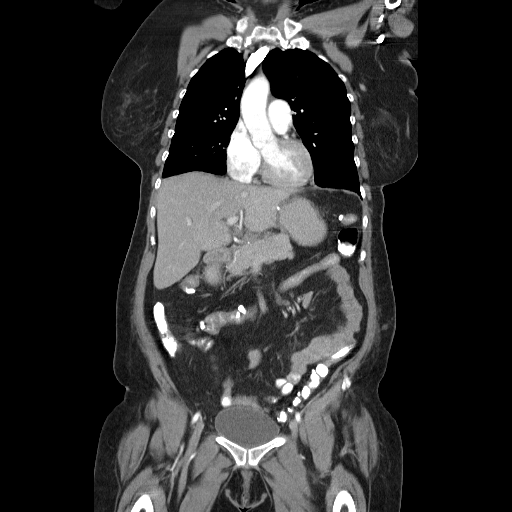
[im 51/92  soft-tissue]
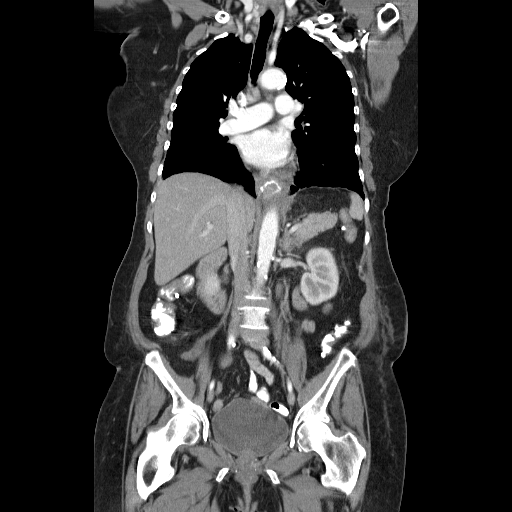

[16 of 46 positions shown; findings below may reference images not displayed]

FINDINGS: No enlarged axillary or supraclavicular lymph nodes.  No
enlarged mediastinal or hilar adenopathy.  No pericardial or
pleural effusion.

Trachea appears patent and is midline.

Dilated patulous esophagus with air-fluid level is identified.
This extends to the level of the GE junction.

There is no airspace consolidation.

Stable precarinal lymph node measuring 1 cm, image 23.

There is a mild interstitial reticulation which appears similar to
the previous exam.

No suspicious pulmonary parenchymal nodule or mass identified.  The
visualized bony structures is unremarkable.
IMPRESSION: 1.  No acute cardiopulmonary abnormalities.  Similar appearance of
interstitial reticulation compared with previous exam.

CT ABDOMEN AND PELVIS
FINDINGS: Both adrenal glands appear normal.

The pancreas appears normal.

The subhepatic nodule along the posterior aspect of the right
hepatic lobe measures 1.7 x 1.1 cm, image 57.

No biliary ductal dilatation.

The pancreas appears within normal limits.

Postoperative change compatible with prior partial gastrectomy with
gastroesophageal anastomosis.

The spleen appears normal.

The adrenal glands are both normal.  Normal appearance of the
kidneys.

No enlarged upper abdominal lymph nodes.

There is no pelvic or inguinal adenopathy.

A percutaneous  jejunostomy tube is in place.

No abnormal fluid collections identified within the abdomen or
pelvis.

Review of the visualized osseous structures is significant for
multilevel lumbar spondylosis.
IMPRESSION: 1.  No evidence for upper abdominal or pelvic abscess.
2.  Prior partial gastrectomy with gastroc esophageal anastomoses.
3.  Percutaneous jejunostomy tube.

## 2012-05-10 ENCOUNTER — Emergency Department (INDEPENDENT_AMBULATORY_CARE_PROVIDER_SITE_OTHER): Payer: Medicare Other

## 2012-05-10 ENCOUNTER — Emergency Department (HOSPITAL_COMMUNITY)
Admission: EM | Admit: 2012-05-10 | Discharge: 2012-05-10 | Disposition: A | Discharge status: Home / Self Care | Payer: Medicare Other | Source: Home / Self Care | Attending: Family Medicine | Admitting: Family Medicine

## 2012-05-10 ENCOUNTER — Encounter (HOSPITAL_COMMUNITY): Payer: Self-pay | Admitting: Emergency Medicine

## 2012-05-10 DIAGNOSIS — S20219A Contusion of unspecified front wall of thorax, initial encounter: Secondary | ICD-10-CM

## 2012-05-10 NOTE — ED Provider Notes (Signed)
History     CSN: [id number]  Arrival date & time 05/10/12  1312   First MD Initiated Contact with Patient 05/10/12 1406      Chief Complaint  Patient presents with  . Flank Pain    (Consider location/radiation/quality/duration/timing/severity/associated sxs/prior treatment) Patient is a 79 y.o. female presenting with chest pain. The history is provided by the patient.  Chest Pain The chest pain began 1 - 2 hours ago (fell in store, no other injury except right chest.). The pain is associated with breathing. The severity of the pain is mild. The quality of the pain is described as sharp. The pain does not radiate. Chest pain is worsened by deep breathing. Pertinent negatives for primary symptoms include no shortness of breath and no palpitations.     Past Medical History  Diagnosis Date  . Dyslipidemia   . Arthritis     Hx of osteoarthritis  . Shortness of breath     WITH EXERTION   . COPD (chronic obstructive pulmonary disease)   . Anemia   . History of chemotherapy     carboplatin/taxol  . Lung mass   . 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 History of radiation therapy 01/13/11-02/20/11    abdomen    Past Surgical History  Procedure Date  . Esophagogastrectomy, j tube 11/08/2010   . Cholecystectomy   . Abdominal hysterectomy   . Cholecystectomy   . Cataract extraction     RIGHT EYE   . Port-a-cath removal 05/13/2011    Procedure: REMOVAL PORT-A-CATH;  Surgeon: Almond Lint, MD;  Location: [facility] OR;  Service: General;  Laterality: N/A;  Removal port-a-cath.  . Portacath placement   . Eye surgery   . Bronchoscopy 08/28/11    rt upper lung mass bx  . Cardia resected 11/2010  . Wedge resection 09/18/11    right vats Dr. Sheliah Plane    Family History  Problem Relation Age of Onset  . Cancer Sister     unknown    History  Substance Use Topics  . Smoking status: Former Smoker -- 0.2 packs/day for 30 years    Types: Cigarettes    Quit date: 05/16/1988  . Smokeless  tobacco: Never Used  . Alcohol Use: No    OB History    Grav Para Term Preterm Abortions TAB SAB Ect Mult Living                  Review of Systems  Constitutional: Negative.   Respiratory: Negative for chest tightness and shortness of breath.   Cardiovascular: Positive for chest pain. Negative for palpitations.  Gastrointestinal: Negative.   Neurological: Negative.     Allergies  Review of patient's allergies indicates no known allergies.  Home Medications   Current Outpatient Rx  Name  Route  Sig  Dispense  Refill  . CALCIUM CARBONATE-VITAMIN D 500-200 MG-UNIT PO TABS   Oral   Take 1 tablet by mouth every other day.          . B-12 100 MCG PO TABS   Oral   Take 1 tablet by mouth as needed.          . IRON 325 (65 FE) MG PO TABS   Oral   Take 1 tablet by mouth daily.          . OMEGA-3 FATTY ACIDS 1000 MG PO CAPS   Oral   Take 1 g by mouth daily.          Marland Kitchen  ALBUTEROL SULFATE HFA 108 (90 BASE) MCG/ACT IN AERS   Inhalation   Inhale 3 puffs into the lungs every 4 (four) hours as needed. For shortness of breath          . FLUOCINONIDE 0.05 % EX CREA   Topical   Apply 1 application topically 2 (two) times daily.          Marland Kitchen HYDROXYZINE HCL 10 MG PO TABS   Oral   Take 10 mg by mouth daily as needed. For itching.         Marland Kitchen LOVASTATIN 20 MG PO TABS   Oral   Take 20 mg by mouth daily.           . CENTRUM SILVER PO   Oral   Take 1 tablet by mouth daily.          . ICAPS PO   Oral   Take 1 tablet by mouth every other day.          Marland Kitchen VALACYCLOVIR HCL 1 G PO TABS   Oral   Take 1,000 mg by mouth 3 (three) times daily.            BP 132/73  Pulse 82  Temp 98.4 F (36.9 C) (Oral)  Resp 18  SpO2 96%  Physical Exam  Nursing note and vitals reviewed. Constitutional: She is oriented to person, place, and time. She appears well-developed and well-nourished.  HENT:  Head: Normocephalic and atraumatic.  Eyes: Pupils are equal, round,  and reactive to light.  Neck: Normal range of motion.  Cardiovascular: Normal rate, regular rhythm, normal heart sounds and intact distal pulses.   Pulmonary/Chest: Breath sounds normal. She exhibits tenderness.  Abdominal: Soft. Bowel sounds are normal.  Musculoskeletal: She exhibits no tenderness.  Neurological: She is alert and oriented to person, place, and time.  Skin: Skin is warm and dry.    ED Course  Procedures (including critical care time)  Labs Reviewed - No data to display Dg Ribs Unilateral W/chest Right  05/10/2012  *RADIOLOGY REPORT*  Clinical Data: Flank pain.  Larey Seat and hit right chest.  RIGHT RIBS AND CHEST - 3+ VIEW  Comparison: 11/13/2011  Findings: Lungs are hyperexpanded as before.  There is some scarring in the bases. Interstitial markings are diffusely coarsened with chronic features. The cardiopericardial silhouette is within normal limits for size.  Oblique views of the right ribs show no evidence for a displaced right-sided rib fracture.  No pneumothorax.  No evidence for pleural effusion.  IMPRESSION: Underlying emphysema without evidence for a displaced right-sided rib fracture.   Original Report Authenticated By: Kennith Center, M.D.      1. Contusion of chest wall       MDM  X-rays reviewed and report per radiologist.         Linna Hoff, MD 05/11/12 1401

## 2012-05-10 NOTE — ED Notes (Signed)
Pt c/o soreness on right side ribcage since 12:00pm States she slipped and fell onto tile flooring in a store  Pain increases when she takes deep breaths Denies: head inj/LOC  She is alert and responsive w/no signs of acute distress.

## 2012-09-13 ENCOUNTER — Other Ambulatory Visit: Payer: Self-pay

## 2012-09-13 DIAGNOSIS — D381 Neoplasm of uncertain behavior of trachea, bronchus and lung: Secondary | ICD-10-CM

## 2012-10-21 ENCOUNTER — Encounter: Payer: Self-pay | Admitting: Cardiothoracic Surgery

## 2012-10-21 ENCOUNTER — Other Ambulatory Visit: Payer: Self-pay

## 2012-10-21 ENCOUNTER — Ambulatory Visit (INDEPENDENT_AMBULATORY_CARE_PROVIDER_SITE_OTHER): Payer: Medicare Other | Admitting: Cardiothoracic Surgery

## 2012-10-21 ENCOUNTER — Ambulatory Visit
Admission: RE | Admit: 2012-10-21 | Discharge: 2012-10-21 | Disposition: A | Discharge status: Home / Self Care | Payer: Medicare Other | Source: Ambulatory Visit | Attending: Cardiothoracic Surgery | Admitting: Cardiothoracic Surgery

## 2012-10-21 VITALS — BP 147/83 | HR 80 | Resp 16 | Ht 59.0 in | Wt 98.0 lb

## 2012-10-21 DIAGNOSIS — Z902 Acquired absence of lung [part of]: Secondary | ICD-10-CM

## 2012-10-21 DIAGNOSIS — Z9889 Other specified postprocedural states: Secondary | ICD-10-CM

## 2012-10-21 DIAGNOSIS — D381 Neoplasm of uncertain behavior of trachea, bronchus and lung: Secondary | ICD-10-CM

## 2012-10-21 DIAGNOSIS — C801 Malignant (primary) neoplasm, unspecified: Secondary | ICD-10-CM

## 2012-10-21 NOTE — Progress Notes (Signed)
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 She notes that over the past several months she has lost about 6 pounds. She has had some right chest wall discomfort over the past several months. In January of 2014 she fell and there was concern about fractured ribs but none were detected on chest x-ray done in the emergency room. She is not seen medical oncology since her oncologist left the practice.   History  Smoking status  . Former Smoker -- 0.25 packs/day for 30 years  . Types: Cigarettes  . Quit date: 05/16/1988  Smokeless tobacco  . Never Used       No Known Allergies  Current Outpatient Prescriptions  Medication Sig Dispense Refill  . albuterol (PROVENTIL HFA;VENTOLIN HFA) 108 (90 BASE) MCG/ACT inhaler Inhale 3 puffs into the lungs every 4 (four) hours as needed. For shortness of breath       . calcium-vitamin D (OSCAL WITH D) 500-200 MG-UNIT per tablet Take 1 tablet by mouth every other day.       . cholecalciferol (VITAMIN D) 1000 UNITS tablet Take 1,000 Units by mouth daily.      . Cyanocobalamin (B-12) 100 MCG TABS Take 1 tablet by mouth as needed.       . Ferrous Sulfate (IRON) 325 (65 FE) MG TABS Take 1 tablet by mouth daily.       . fish oil-omega-3 fatty acids 1000 MG capsule Take 1 g by mouth daily.       . fluocinonide cream (LIDEX) 0.05 % Apply 1 application topically 2 (two) times daily.       . hydrOXYzine (ATARAX) 10 MG tablet Take 10 mg by mouth daily as  needed. For itching.      . mirtazapine (REMERON) 15 MG tablet Take 15 mg by mouth at bedtime.      . Multiple Vitamins-Minerals (CENTRUM SILVER PO) Take 1 tablet by mouth daily.       . Multiple Vitamins-Minerals (ICAPS PO) Take 1 tablet by mouth every other day.        No current facility-administered medications for this visit.     Physical Exam: BP 147/83  Pulse 80  Resp 16  Ht 4\' 11"  (1.499 m)  Wt 98 lb (44.453 kg)  BMI 19.78 kg/m2  SpO2 94%  General appearance: alert, cooperative and no distress Neurologic: intact Heart: regular rate and rhythm, S1, S2 normal, no murmur, click, rub or gallop and normal apical impulse Lungs: clear to auscultation bilaterally Abdomen: soft, non-tender; bowel sounds normal; no masses,  no organomegaly Extremities: extremities normal, atraumatic, no cyanosis or edema and Homans sign is negative, no sign of DVT Wound: all well healed No cervical or supraclavicular or axillary adenopathy appreciated  Diagnostic Studies & Laboratory data:         Recent Radiology Findings: Ct Chest Wo Contrast  10/21/2012   *  RADIOLOGY REPORT*  Clinical Data: Evaluate lung lesion  CT CHEST WITHOUT CONTRAST  Technique:  Multidetector CT imaging of the chest was performed following the standard protocol without IV contrast.  Comparison: 03/25/2012  Findings: There is no pleural effusion identified.  Moderate changes of centrilobular emphysema identified.  Interval development of multi focal pleural base lesions within the right lung.  Index right upper lobe subpleural lung lesion measures 2.5 cm, image 13/series 4.  Within the lateral right upper lobe there is a lesion measuring 3.6 x 1.5 cm, image 24/series 4. There is associated the erosive changes involving the adjacent fourth rib. Lesion overlying the right lower lobe measures 3.9 x 1.6 cm. This also demonstrates destructive and erosive changes involving the overlying 7th rib.  There is no pleural effusion identified.   The left lung appears clear.  Heart size appears within normal limits.  No enlarged mediastinal or hilar lymph nodes.  No pericardial effusion.  Postsurgical changes involving the stomach noted.  The esophagus appears dilated and fluid-filled.  Limited imaging through the upper abdomen shows a calcified granuloma within the right lobe of liver.  IMPRESSION:  1.  Multi focal pleural based lesions within the right hemithorax are noted and are concerning for tumor recurrence. 2.  Evidence of direct tumor extension and involvement of the right fourth and seventh ribs. 3.  Postoperative changes involving the stomach with dilated and fluid-filled esophagus.   Original Report Authenticated By: Signa Kell, M.D.       Recent Labs: Lab Results  Component Value Date   WBC 6.1 09/17/2011   HGB 11.1* 09/17/2011   HCT 34.0* 09/17/2011   PLT 159 09/17/2011   GLUCOSE 86 09/17/2011   ALT 37* 09/17/2011   AST 46* 09/17/2011   NA 140 09/17/2011   K 3.6 09/17/2011   CL 108 09/17/2011   CREATININE 0.57 09/17/2011   BUN 5* 09/17/2011   CO2 26 09/17/2011   INR 0.93 09/12/2011   Path from resection 09/2011 Lung, wedge biopsy/resection, Right upper - SPINDLE CELL CARCINOMA. - MARGINS UNINVOLVED BY CARCINOMA. - SEE COMMENT. Microscopic Comment LUNG Specimen, including laterality: Right lung Procedure: Wedge resection Specimen integrity (intact/disrupted): Intact Tumor site: Upper lobe Tumor focality: Single focus Maximum tumor size (cm): 3.8 cm Histologic type: Spindle cell Grade: G4 (undifferentiated). Margins: Uninvolved by carcinoma Visceral pleura invasion: Present Tumor extension: Involves pulmonary parenchyma and visceral pleura Treatment effect (if treated with neoadjuvant therapy): N/A Lymph -Vascular invasion: Present Lymph nodes: None submitted TNM code: pT2a, pNX Ancillary studies: None performed Non-neoplastic lung: N/A Comments: Sections from the lesion show a spindle cell neoplasm with focal  areas of necrosis and occasional atypical mitotic figures. The single cells have a somewhat intersecting fascicle appearance while in other areas the spindle cells appear to be in a more myxoid background. A panel of immunostains was performed in an attempt to further characterize this neoplasm with the following results: Smooth muscle actin - strongly positive, diffuse. 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 Resection of right upper lobe spindle cell tumor 09/2011 Now with evidence of metastatic disease involving the right lung and right ribs I reviewed with her the findings on the CT scan and have recommended PET scan and needle biopsy of the right chest lesion She will repeat referred back to medical oncology to  consider further treatment options depending on pathologic findings I'll plan to see her back in 7-10 days after the scan and needle biopsy have been completed.    [name] MD 10/21/2012 1:34 PM

## 2012-10-27 ENCOUNTER — Encounter (HOSPITAL_COMMUNITY): Payer: Self-pay

## 2012-10-27 ENCOUNTER — Encounter (HOSPITAL_COMMUNITY)
Admission: RE | Admit: 2012-10-27 | Discharge: 2012-10-27 | Disposition: A | Discharge status: Home / Self Care | Payer: Medicare Other | Source: Ambulatory Visit | Attending: Cardiothoracic Surgery | Admitting: Cardiothoracic Surgery

## 2012-10-27 ENCOUNTER — Other Ambulatory Visit: Payer: Self-pay | Admitting: Radiology

## 2012-10-27 DIAGNOSIS — D381 Neoplasm of uncertain behavior of trachea, bronchus and lung: Secondary | ICD-10-CM | POA: Insufficient documentation

## 2012-10-27 LAB — GLUCOSE, CAPILLARY: Glucose-Capillary: 75 mg/dL (ref 70–99)

## 2012-10-27 MED ORDER — FLUDEOXYGLUCOSE F - 18 (FDG) INJECTION
17.8000 | Freq: Once | INTRAVENOUS | Status: AC | PRN
  Administered 2012-10-27: 17.8 via INTRAVENOUS

## 2012-10-28 ENCOUNTER — Encounter (HOSPITAL_COMMUNITY): Payer: Self-pay | Admitting: Pharmacy Technician

## 2012-11-01 ENCOUNTER — Other Ambulatory Visit: Payer: Self-pay | Admitting: *Deleted

## 2012-11-01 ENCOUNTER — Ambulatory Visit (HOSPITAL_COMMUNITY)
Admission: RE | Admit: 2012-11-01 | Discharge: 2012-11-01 | Disposition: A | Discharge status: Home / Self Care | Payer: Medicare Other | Source: Ambulatory Visit | Attending: Cardiothoracic Surgery | Admitting: Cardiothoracic Surgery

## 2012-11-01 ENCOUNTER — Encounter (HOSPITAL_COMMUNITY): Payer: Self-pay

## 2012-11-01 ENCOUNTER — Ambulatory Visit (HOSPITAL_COMMUNITY)
Admission: RE | Admit: 2012-11-01 | Discharge: 2012-11-01 | Disposition: A | Discharge status: Home / Self Care | Payer: Medicare Other | Source: Ambulatory Visit | Attending: Diagnostic Radiology | Admitting: Diagnostic Radiology

## 2012-11-01 VITALS — BP 134/68 | HR 70 | Temp 98.0°F | Resp 20 | Ht 59.0 in | Wt 98.0 lb

## 2012-11-01 DIAGNOSIS — D381 Neoplasm of uncertain behavior of trachea, bronchus and lung: Secondary | ICD-10-CM

## 2012-11-01 DIAGNOSIS — Z9089 Acquired absence of other organs: Secondary | ICD-10-CM | POA: Insufficient documentation

## 2012-11-01 DIAGNOSIS — J4489 Other specified chronic obstructive pulmonary disease: Secondary | ICD-10-CM | POA: Insufficient documentation

## 2012-11-01 DIAGNOSIS — Z9221 Personal history of antineoplastic chemotherapy: Secondary | ICD-10-CM | POA: Insufficient documentation

## 2012-11-01 DIAGNOSIS — C3490 Malignant neoplasm of unspecified part of unspecified bronchus or lung: Secondary | ICD-10-CM | POA: Insufficient documentation

## 2012-11-01 DIAGNOSIS — E785 Hyperlipidemia, unspecified: Secondary | ICD-10-CM | POA: Insufficient documentation

## 2012-11-01 DIAGNOSIS — J449 Chronic obstructive pulmonary disease, unspecified: Secondary | ICD-10-CM | POA: Insufficient documentation

## 2012-11-01 LAB — CBC
MCH: 28.3 pg (ref 26.0–34.0)
MCHC: 32 g/dL (ref 30.0–36.0)
Platelets: 175 10*3/uL (ref 150–400)

## 2012-11-01 LAB — PROTIME-INR: Prothrombin Time: 12.5 seconds (ref 11.6–15.2)

## 2012-11-01 MED ORDER — MIDAZOLAM HCL 2 MG/2ML IJ SOLN
INTRAMUSCULAR | Status: AC
  Filled 2012-11-01: qty 6

## 2012-11-01 MED ORDER — SODIUM CHLORIDE 0.9 % IV SOLN
Freq: Once | INTRAVENOUS | Status: AC
  Administered 2012-11-01: 10:00:00 via INTRAVENOUS

## 2012-11-01 MED ORDER — HYDROCODONE-ACETAMINOPHEN 5-325 MG PO TABS
1.0000 | ORAL_TABLET | ORAL | Status: DC | PRN
  Filled 2012-11-01: qty 2

## 2012-11-01 MED ORDER — FENTANYL CITRATE 0.05 MG/ML IJ SOLN
INTRAMUSCULAR | Status: AC
  Filled 2012-11-01: qty 6

## 2012-11-01 MED ORDER — FENTANYL CITRATE 0.05 MG/ML IJ SOLN
INTRAMUSCULAR | Status: AC | PRN
  Administered 2012-11-01 (×2): 25 ug via INTRAVENOUS

## 2012-11-01 MED ORDER — MIDAZOLAM HCL 2 MG/2ML IJ SOLN
INTRAMUSCULAR | Status: AC | PRN
  Administered 2012-11-01 (×2): 0.5 mg via INTRAVENOUS

## 2012-11-01 NOTE — H&P (Signed)
Chief Complaint: "I'm here for a biopsy" Referring Physician:Gerhardt HPI: [name] is an 79 y.o. female with findings of pleural based lung masses. She has had workup including PET scan and is now referred for biopsy for tissue diagnosis. PMHx and meds reviewed.  Past Medical History:  Past Medical History  Diagnosis Date  . Dyslipidemia   . Arthritis     Hx of osteoarthritis  . Shortness of breath     WITH EXERTION   . COPD (chronic obstructive pulmonary disease)   . Anemia   . History of chemotherapy     carboplatin/taxol  . Lung mass   . 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 History of radiation therapy 01/13/11-02/20/11    abdomen    Past Surgical History:  Past Surgical History  Procedure Laterality Date  . Esophagogastrectomy, j tube 11/08/2010    . Cholecystectomy    . Abdominal hysterectomy    . Cholecystectomy    . Cataract extraction      RIGHT EYE   . Port-a-cath removal  05/13/2011    Procedure: REMOVAL PORT-A-CATH;  Surgeon: Almond Lint, MD;  Location: [facility] OR;  Service: General;  Laterality: N/A;  Removal port-a-cath.  . Portacath placement    . Eye surgery    . Bronchoscopy  08/28/11    rt upper lung mass bx  . Cardia resected  11/2010  . Wedge resection  09/18/11    right vats Dr. Sheliah Plane    Family History:  Family History  Problem Relation Age of Onset  . Cancer Sister     unknown    Social History:  reports that she quit smoking about 24 years ago. Her smoking use included Cigarettes. She has a 7.5 pack-year smoking history. She has never used smokeless tobacco. She reports that she does not drink alcohol or use illicit drugs.  Allergies: No Known Allergies  Medications: albuterol (PROVENTIL HFA;VENTOLIN HFA) 108 (90 BASE) MCG/ACT inhaler (Taking) Sig - Route: Inhale 3 puffs into the lungs every 4 (four) hours as needed. For shortness of breath - Inhalation Class: Historical Med calcium-vitamin D (OSCAL WITH D) 500-200 MG-UNIT per tablet  (Taking) Sig - Route: Take 1 tablet by mouth every other day. - Oral Class: Historical Med Number of times this order has been changed since signing: 2 [address] cholecalciferol (VITAMIN D) 1000 UNITS tablet (Taking) Sig - Route: Take 1,000 Units by mouth daily. - Oral Class: Historical Med Number of times this order has been changed since signing: 1 [address] Cyanocobalamin (B-12) 100 MCG TABS (Taking) Sig - Route: Take 1 tablet by mouth as needed. - Oral Class: Historical Med Number of times this order has been changed since signing: 2 [address] Ferrous Sulfate (IRON) 325 (65 FE) MG TABS (Taking) Sig - Route: Take 1 tablet by mouth daily. - Oral Class: Historical Med Number of times this order has been changed since signing: 2 [address] fish oil-omega-3 fatty acids 1000 MG capsule (Taking) Sig - Route: Take 1 g by mouth daily. - Oral Class: Historical Med Number of times this order has been changed since signing: 2 [address] hydrOXYzine (ATARAX) 10 MG tablet (Taking) Sig - Route: Take 10 mg by mouth daily as needed. For itching. - Oral Class: Historical Med Number of times this order has been changed since signing: 2 [address] mirtazapine (REMERON) 15 MG tablet (Taking) Sig - Route: Take 15 mg by mouth at bedtime. -  Oral Class: Historical Med Number of times this order has been changed since signing: 1 Order [address] Multiple Vitamins-Minerals (CENTRUM SILVER PO) (Taking) Sig - Route: Take 1 tablet by mouth daily. - Oral Class: Historical Med Number of times this order has been changed since signing: 2 Order [address] Multiple Vitamins-Minerals (ICAPS PO) (Taking) Sig - Route: Take 1 tablet by mouth every other day   Please HPI for pertinent positives, otherwise complete 10 system ROS negative.  Physical Exam: BP 146/82  Pulse 66  Temp(Src) 97.2 F (36.2 C) (Oral)  Resp 18  Ht 4\' 11"  (1.499 m)  Wt 98 lb (44.453 kg)  BMI 19.78 kg/m2  SpO2 95% Body  mass index is 19.78 kg/(m^2).   General Appearance:  Alert, cooperative, no distress, appears stated age  Head:  Normocephalic, without obvious abnormality, atraumatic  ENT: Unremarkable  Lungs:   Clear to auscultation bilaterally, no w/r/r.  Chest Wall:  No tenderness or deformity  Heart:  Regular rate and rhythm, S1, S2 normal, no murmur, rub or gallop.  Neurologic: Normal affect, no gross deficits.   Results for orders placed during the hospital encounter of 11/01/12 (from the past 48 hour(s))  APTT     Status: None   Collection Time    11/01/12  9:30 AM      Result Value Range   aPTT 34  24 - 37 seconds  CBC     Status: None   Collection Time    11/01/12  9:30 AM      Result Value Range   WBC 4.3  4.0 - 10.5 K/uL   RBC 4.24  3.87 - 5.11 MIL/uL   Hemoglobin 12.0  12.0 - 15.0 g/dL   HCT 16.1  09.6 - 04.5 %   MCV 88.4  78.0 - 100.0 fL   MCH 28.3  26.0 - 34.0 pg   MCHC 32.0  30.0 - 36.0 g/dL   RDW 40.9  81.1 - 91.4 %   Platelets 175  150 - 400 K/uL  PROTIME-INR     Status: None   Collection Time    11/01/12  9:30 AM      Result Value Range   Prothrombin Time 12.5  11.6 - 15.2 seconds   INR 0.95  0.00 - 1.49   No results found.  Assessment/Plan Multiple right sided pleural based masses. Discussed CT guided biopsy of right lateral chest wall lesion under sedation. Explained procedure, risks, complications. Labs reviewed. Consent signed in chart  Brayton El PA-C 11/01/2012, 10:58 AM

## 2012-11-01 NOTE — Procedures (Signed)
CT guided core biopsies of a right chest wall/pleural mass.  No immediate complication.

## 2012-11-02 ENCOUNTER — Telehealth: Payer: Self-pay | Admitting: *Deleted

## 2012-11-02 ENCOUNTER — Ambulatory Visit (INDEPENDENT_AMBULATORY_CARE_PROVIDER_SITE_OTHER): Payer: Medicare Other | Admitting: Cardiothoracic Surgery

## 2012-11-02 ENCOUNTER — Encounter: Payer: Self-pay | Admitting: Cardiothoracic Surgery

## 2012-11-02 VITALS — BP 125/82 | HR 84 | Resp 18 | Ht 59.0 in | Wt 98.0 lb

## 2012-11-02 DIAGNOSIS — Z9889 Other specified postprocedural states: Secondary | ICD-10-CM

## 2012-11-02 DIAGNOSIS — C801 Malignant (primary) neoplasm, unspecified: Secondary | ICD-10-CM

## 2012-11-03 ENCOUNTER — Telehealth: Payer: Self-pay | Admitting: Internal Medicine

## 2012-11-03 NOTE — Telephone Encounter (Signed)
C/D 11/03/12 for appt. 11/04/12

## 2012-11-03 NOTE — Progress Notes (Signed)
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 She notes that over the past several months she has lost about 6 pounds. She has had some right chest wall discomfort over the past several months. In January of 2014 she fell and there was concern about fractured ribs but none were detected on chest x-ray done in the emergency room. She is not seen medical oncology since her oncologist left the practice.   History  Smoking status  . Former Smoker -- 0.25 packs/day for 30 years  . Types: Cigarettes  . Quit date: 05/16/1988  Smokeless tobacco  . Never Used       No Known Allergies  Current Outpatient Prescriptions  Medication Sig Dispense Refill  . albuterol (PROVENTIL HFA;VENTOLIN HFA) 108 (90 BASE) MCG/ACT inhaler Inhale 3 puffs into the lungs every 4 (four) hours as needed. For shortness of breath       . calcium-vitamin D (OSCAL WITH D) 500-200 MG-UNIT per tablet Take 1 tablet by mouth every other day.       . cholecalciferol (VITAMIN D) 1000 UNITS tablet Take 1,000 Units by mouth daily.      . Cyanocobalamin (B-12) 100 MCG TABS Take 1 tablet by mouth as needed.       . Ferrous Sulfate (IRON) 325 (65 FE) MG TABS Take 1 tablet by mouth daily.       . fish oil-omega-3 fatty acids 1000 MG capsule Take 1 g by mouth daily.       . hydrOXYzine (ATARAX) 10 MG tablet Take 10 mg by mouth daily as needed. For itching.      . mirtazapine (REMERON) 15 MG tablet Take 15 mg by mouth at  bedtime.      . Multiple Vitamins-Minerals (CENTRUM SILVER PO) Take 1 tablet by mouth daily.       . Multiple Vitamins-Minerals (ICAPS PO) Take 1 tablet by mouth every other day.        No current facility-administered medications for this visit.     Physical Exam: BP 125/82  Pulse 84  Resp 18  Ht 4\' 11"  (1.499 m)  Wt 98 lb (44.453 kg)  BMI 19.78 kg/m2  SpO2 92%  General appearance: alert, cooperative and no distress Neurologic: intact Heart: regular rate and rhythm, S1, S2 normal, no murmur, click, rub or gallop and normal apical impulse Lungs: clear to auscultation bilaterally Abdomen: soft, non-tender; bowel sounds normal; no masses,  no organomegaly Extremities: extremities normal, atraumatic, no cyanosis or edema and Homans sign is negative, no sign of DVT Wound: all well healed No cervical or supraclavicular or axillary adenopathy appreciated  Diagnostic Studies & Laboratory data:         Recent Radiology Findings: Dg Chest 1 View  11/01/2012   *RADIOLOGY REPORT*  Clinical Data: 79 year old female status post right chest biopsy.  CHEST - 1 VIEW  Comparison: CT guided biopsy images 1209 hours the same day and earlier.  Findings: Upright AP portable view at 1355 hours.  No pneumothorax identified.  Peripheral increased opacity in the right apex and along the right lateral chest wall.  Stable lung volumes.  No pulmonary edema or definite effusion.  Stable cardiac size and mediastinal contours.  Stable visualized osseous structures.  IMPRESSION: No pneumothorax identified status post right  chest biopsy of pleural based mass.   Original Report Authenticated By: Erskine Speed, M.D.   Ct Biopsy  11/01/2012   *RADIOLOGY REPORT*  Clinical history:History of right lung spindle cell tumor resection.  The patient has new pleural-based masses.  The patient needs a tissue diagnosis.  PROCEDURE(S): CT GUIDED BIOPSY OF A RIGHT PLEURAL MASS  Physician: Rachelle Hora. Henn, MD  Medications:Versed 1 mg,  Fentanyl 50 mcg. A radiology nurse monitored the patient for moderate sedation.  Moderate sedation time:10 minutes  Procedure:The procedure was explained to the patient.  The risks and benefits of the procedure were discussed and the patient's questions were addressed.  Informed consent was obtained from the patient.  The patient was placed on her left side.  CT images through the chest were obtained.  Three pleural based lesions were identified and the lesion in the right lower chest was selected for biopsy.  The right mid axillary region was prepped and draped in a sterile fashion.  The skin was anesthetized with lidocaine.  17 gauge guide needle was directed into the pleural based lesion with CT guidance.  Four core biopsies were obtained with an 18 gauge core device.  Samples placed in formalin.  17 gauge needle was removed without complication.  Findings:Right pleural based masses.  The lesion in the lower right lateral chest was biopsied.  The biopsied lesion roughly measures 3.2 cm in maximum diameter.  No evidence for a pneumothorax after the biopsy.  Complications: None  Impression:CT guided core biopsies of a pleural-based mass in the right lower chest.   Original Report Authenticated By: Richarda Overlie, M.D.       Recent Labs: Lab Results  Component Value Date   WBC 4.3 11/01/2012   HGB 12.0 11/01/2012   HCT 37.5 11/01/2012   PLT 175 11/01/2012   GLUCOSE 86 09/17/2011   ALT 37* 09/17/2011   AST 46* 09/17/2011   NA 140 09/17/2011   K 3.6 09/17/2011   CL 108 09/17/2011   CREATININE 0.57 09/17/2011   BUN 5* 09/17/2011   CO2 26 09/17/2011   INR 0.95 11/01/2012   Path from resection 09/2011 Lung, wedge biopsy/resection, Right upper - SPINDLE CELL CARCINOMA. - MARGINS UNINVOLVED BY CARCINOMA. - SEE COMMENT. Microscopic Comment LUNG Specimen, including laterality: Right lung Procedure: Wedge resection Specimen integrity (intact/disrupted): Intact Tumor site: Upper lobe Tumor focality: Single  focus Maximum tumor size (cm): 3.8 cm Histologic type: Spindle cell Grade: G4 (undifferentiated). Margins: Uninvolved by carcinoma Visceral pleura invasion: Present Tumor extension: Involves pulmonary parenchyma and visceral pleura Treatment effect (if treated with neoadjuvant therapy): N/A Lymph -Vascular invasion: Present Lymph nodes: None submitted TNM code: pT2a, pNX Ancillary studies: None performed Non-neoplastic lung: N/A Comments: Sections from the lesion show a spindle cell neoplasm with focal areas of necrosis and occasional atypical mitotic figures. The single cells have a somewhat intersecting fascicle appearance while in other areas the spindle cells appear to be in a more myxoid background. A panel of immunostains was performed in an attempt to further characterize this neoplasm with the following results:  Smooth muscle actin - strongly positive, diffuse. 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 Resection of right upper lobe spindle cell tumor 09/2011 Now with evidence of metastatic disease involving the right lung and right ribs  Needle BX done with out side effects, no path done yet. Have arranged for patient to come to [facility] to see medical and radiation oncology  [name] MD 11/03/2012 12:07 PM

## 2012-11-04 ENCOUNTER — Encounter: Payer: Self-pay | Admitting: *Deleted

## 2012-11-04 ENCOUNTER — Ambulatory Visit (HOSPITAL_BASED_OUTPATIENT_CLINIC_OR_DEPARTMENT_OTHER): Payer: Medicare Other | Admitting: Internal Medicine

## 2012-11-04 ENCOUNTER — Ambulatory Visit
Admission: RE | Admit: 2012-11-04 | Discharge: 2012-11-04 | Disposition: A | Discharge status: Home / Self Care | Payer: Medicare Other | Source: Ambulatory Visit | Attending: Radiation Oncology | Admitting: Radiation Oncology

## 2012-11-04 ENCOUNTER — Encounter: Payer: Self-pay | Admitting: Internal Medicine

## 2012-11-04 ENCOUNTER — Telehealth: Payer: Self-pay | Admitting: Internal Medicine

## 2012-11-04 VITALS — BP 159/92 | HR 76 | Temp 98.3°F | Resp 20 | Ht 59.0 in | Wt 99.9 lb

## 2012-11-04 DIAGNOSIS — C801 Malignant (primary) neoplasm, unspecified: Secondary | ICD-10-CM | POA: Insufficient documentation

## 2012-11-04 NOTE — Progress Notes (Signed)
[facility] [phone]   [phone]  Multidisciplinary thoracic oncology clinic (MTOC)  CONSULT NOTE  REFERRING PHYSICIAN: Dr. Ramon Dredge B. 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  HPI [name] is a 79 y.o. female was past medical history significant for locally advanced adenocarcinoma of the stomach status post resection in July of 2012 under the care of Dr. Elige Radon followed by concurrent chemoradiation under the care of Dr. Mitzi Hansen and Dr. Dalene Carrow. The patient is doing fine and on routine follow up scan of the chest, abdomen and pelvis on 07/23/2014 there is a new 2.8 x 1.9 CM mass in the apex of the right upper lobe. A PET scan on 02/10/2012 showed 3.2 x 2.1 CM pleural based hypermetabolic mass in the medial aspect of the right lung apex consistent with malignancy. On 09/16/2011 the patient underwent wedge resection of the right upper lobe under the care of Dr. Tyrone Sage. The final pathology was consistent with spindle cell carcinoma. The patient was followed by observation. Repeat CT scan of the chest without contrast on 10/21/2012 showed interval development of multi focal pleural base lesions within the right lung. Index right upper lobe subpleural lung lesion measures 2.5 cm. Within the lateral right upper lobe there is a lesion measuring 3.6 x 1.5 cm. There is associated the erosive changes involving the adjacent fourth rib. Lesion overlying the right lower lobe measures 3.9 x 1.6 cm. This also demonstrates destructive and erosive changes involving the overlying 7th rib. This was followed by a PET scan on 10/27/2012 and it showed multiple right-sided pleural based masses consistent with metastatic disease. There is a chest wall invasion of the more inferior and lateral lesion with rib destruction but no evidence of extrathoracic metastatic disease.  On 11/01/2012 the  patient underwent CT-guided biopsy of a right pleural mass and the final pathology (Accession: [id number]) showed recurrent poorly differentiated carcinoma. The biopsies are composed of malignant spindle cells with pleomorphic nuclei and dense stroma. The morphologic features are similar to those seen in the prior case [id number]. Immunostains demonstrate that the tumor cells are weakly patchy positive for CK AE1/AE3 and negative for CK7 and CK20 with appropriate controls. The overall findings are consistent with recurrent spindle cell/ sarcomatoid carcinoma. The patient was referred to me today for evaluation and recommendation regarding treatment of her condition. The patient is feeling fine today with no specific complaints except for tenderness in the right chest wound. She denied having any significant shortness breath, cough or hemoptysis. She is slowly losing weight and lost around 18 pounds in the last 18 months. She denied having any significant headache or visual changes. The patient denied having any significant nausea or vomiting or change in her bowel movement.   @SFHPI @  Past Medical History  Diagnosis Date  . Dyslipidemia   . Arthritis     Hx of osteoarthritis  . Shortness of breath     WITH EXERTION   . COPD (chronic obstructive pulmonary disease)   . Anemia   . History of chemotherapy     carboplatin/taxol  . Lung mass   . 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 History of radiation therapy 01/13/11-02/20/11    abdomen    Past Surgical History  Procedure Laterality Date  . Esophagogastrectomy, j tube 11/08/2010    . Cholecystectomy    . Abdominal hysterectomy    . Cholecystectomy    .  Cataract extraction      RIGHT EYE   . Port-a-cath removal  05/13/2011    Procedure: REMOVAL PORT-A-CATH;  Surgeon: Almond Lint, MD;  Location: [facility] OR;  Service: General;  Laterality: N/A;  Removal port-a-cath.  . Portacath placement    . Eye surgery    . Bronchoscopy  08/28/11    rt upper lung  mass bx  . Cardia resected  11/2010  . Wedge resection  09/18/11    right vats Dr. Sheliah Plane    Family History  Problem Relation Age of Onset  . Cancer Sister     unknown    Social History History  Substance Use Topics  . Smoking status: Former Smoker -- 0.25 packs/day for 30 years    Types: Cigarettes    Quit date: 05/16/1988  . Smokeless tobacco: Never Used  . Alcohol Use: No    No Known Allergies  Current Outpatient Prescriptions  Medication Sig Dispense Refill  . albuterol (PROVENTIL HFA;VENTOLIN HFA) 108 (90 BASE) MCG/ACT inhaler Inhale 3 puffs into the lungs every 4 (four) hours as needed. For shortness of breath       . calcium-vitamin D (OSCAL WITH D) 500-200 MG-UNIT per tablet Take 1 tablet by mouth every other day.       . cholecalciferol (VITAMIN D) 1000 UNITS tablet Take 1,000 Units by mouth daily.      . Cyanocobalamin (B-12) 100 MCG TABS Take 1 tablet by mouth as needed.       . Ferrous Sulfate (IRON) 325 (65 FE) MG TABS Take 1 tablet by mouth daily.       . fish oil-omega-3 fatty acids 1000 MG capsule Take 1 g by mouth daily.       . hydrOXYzine (ATARAX) 10 MG tablet Take 10 mg by mouth daily as needed. For itching.      . mirtazapine (REMERON) 15 MG tablet Take 15 mg by mouth at bedtime.      . Multiple Vitamins-Minerals (CENTRUM SILVER PO) Take 1 tablet by mouth daily.       . Multiple Vitamins-Minerals (ICAPS PO) Take 1 tablet by mouth every other day.        No current facility-administered medications for this visit.    Review of Systems  A comprehensive review of systems was negative except for: Constitutional: positive for fatigue Respiratory: positive for Tenderness at the right chest wall.  Physical Exam  QMV:HQION, healthy, no distress, well nourished and well developed SKIN: skin color, texture, turgor are normal HEAD: Normocephalic EYES: normal, PERRLA EARS: External ears normal OROPHARYNX:no exudate and no erythema  NECK: supple, no  adenopathy LYMPH:  no palpable lymphadenopathy, no hepatosplenomegaly BREAST:not examined LUNGS: clear to auscultation  HEART: regular rate & rhythm, no murmurs and no gallops ABDOMEN:abdomen soft, non-tender, normal bowel sounds and no masses or organomegaly BACK: Back symmetric, no curvature. EXTREMITIES:no joint deformities, effusion, or inflammation, no edema, no skin discoloration  NEURO: alert & oriented x 3 with fluent speech, no focal motor/sensory deficits  PERFORMANCE STATUS: ECOG 1  LABORATORY DATA: Lab Results  Component Value Date   WBC 4.3 11/01/2012   HGB 12.0 11/01/2012   HCT 37.5 11/01/2012   MCV 88.4 11/01/2012   PLT 175 11/01/2012      Chemistry      Component Value Date/Time   NA 140 09/17/2011 0435   NA 143 07/23/2011 0916   K 3.6 09/17/2011 0435   K 3.8 07/23/2011 0916   CL 108 09/17/2011  0435   CL 104 07/23/2011 0916   CO2 26 09/17/2011 0435   CO2 23 07/23/2011 0916   BUN 5* 09/17/2011 0435   BUN 7 07/23/2011 0916   CREATININE 0.57 09/17/2011 0435   CREATININE 0.8 07/23/2011 0916      Component Value Date/Time   CALCIUM 9.0 09/17/2011 0435   CALCIUM 8.9 07/23/2011 0916   ALKPHOS 100 09/17/2011 0435   ALKPHOS 109* 07/23/2011 0916   AST 46* 09/17/2011 0435   AST 31 07/23/2011 0916   ALT 37* 09/17/2011 0435   BILITOT 0.5 09/17/2011 0435   BILITOT 0.60 07/23/2011 0916       RADIOGRAPHIC STUDIES: Dg Chest 1 View  11/01/2012   *RADIOLOGY REPORT*  Clinical Data: 79 year old female status post right chest biopsy.  CHEST - 1 VIEW  Comparison: CT guided biopsy images 1209 hours the same day and earlier.  Findings: Upright AP portable view at 1355 hours.  No pneumothorax identified.  Peripheral increased opacity in the right apex and along the right lateral chest wall.  Stable lung volumes.  No pulmonary edema or definite effusion.  Stable cardiac size and mediastinal contours.  Stable visualized osseous structures.  IMPRESSION: No pneumothorax identified status post right   chest biopsy of pleural based mass.   Original Report Authenticated By: Erskine Speed, M.D.   Ct Chest Wo Contrast  10/21/2012   *RADIOLOGY REPORT*  Clinical Data: Evaluate lung lesion  CT CHEST WITHOUT CONTRAST  Technique:  Multidetector CT imaging of the chest was performed following the standard protocol without IV contrast.  Comparison: 03/25/2012  Findings: There is no pleural effusion identified.  Moderate changes of centrilobular emphysema identified.  Interval development of multi focal pleural base lesions within the right lung.  Index right upper lobe subpleural lung lesion measures 2.5 cm, image 13/series 4.  Within the lateral right upper lobe there is a lesion measuring 3.6 x 1.5 cm, image 24/series 4. There is associated the erosive changes involving the adjacent fourth rib. Lesion overlying the right lower lobe measures 3.9 x 1.6 cm. This also demonstrates destructive and erosive changes involving the overlying 7th rib.  There is no pleural effusion identified.  The left lung appears clear.  Heart size appears within normal limits.  No enlarged mediastinal or hilar lymph nodes.  No pericardial effusion.  Postsurgical changes involving the stomach noted.  The esophagus appears dilated and fluid-filled.  Limited imaging through the upper abdomen shows a calcified granuloma within the right lobe of liver.  IMPRESSION:  1.  Multi focal pleural based lesions within the right hemithorax are noted and are concerning for tumor recurrence. 2.  Evidence of direct tumor extension and involvement of the right fourth and seventh ribs. 3.  Postoperative changes involving the stomach with dilated and fluid-filled esophagus.   Original Report Authenticated By: Signa Kell, M.D.   Nm Pet Image Restag (ps) Skull Base To Thigh  10/27/2012   *RADIOLOGY REPORT*  Clinical Data: Subsequent treatment strategy for chest wall mass with history of lung cancer. Gastric cancer.  NUCLEAR MEDICINE PET SKULL BASE TO THIGH   Fasting Blood Glucose:  75  Technique:  18.5 mCi F-18 FDG was injected intravenously. CT data was obtained and used for attenuation correction and anatomic localization only.  (This was not acquired as a diagnostic CT examination.) Additional exam technical data entered on technologist worksheet.  Comparison:  Chest CT 10/21/2012.  PET 08/11/2011  Findings:  Neck: No abnormal hypermetabolism.  Chest:  Minimal right  apical pleural nodularity and hypermetabolism on image 42/series 2.  This measures a S.U.V. max of 4.1. 3 right-sided pleural based masses. An anterior lesion measures 2.7 cm and a S.U.V. max of 7.5 on image 53/series 2. More inferior anterior lesion measures 2.1 x 3.8 cm and a S.U.V. max of 8.9 on image 73. More inferior and lateral lesion measures 1.8 x 3.2 cm and a S.U.V. max of 9.2 on image 84.  This demonstrates direct chest wall invasion and destruction of the seventh lateral right rib.  Abdomen/Pelvis:  Surgical changes at the gastroesophageal junction with wall thickening involving the remaining stomach.  This is likely chronic.  There is focal hypermetabolism in the region of the antrum which measures 5.4 cm on image 117.  Skeleton:  No hypermetabolic osseous metastasis.  CT  images performed for attenuation correction demonstrate no significant findings within the neck.  Chest findings deferred to recent diagnostic CT.  Development of trace bilateral pleural effusions.  Coronary artery atherosclerosis.  IMPRESSION:  1.  Multiple right-sided pleural based masses, most consistent with metastatic disease.  As described on the prior CT, there is chest wall invasion of the more inferior and lateral lesion with rib destruction. 2.  No evidence of extrathoracic metastatic disease.  3.  Favor physiologic hypermetabolism within the gastric antrum. Concurrent wall thickening for which gastritis cannot be excluded. 4.  Development of bilateral pleural effusions since 10/21/2012.   Original Report  Authenticated By: Jeronimo Greaves, M.D.   Ct Biopsy  11/01/2012   *RADIOLOGY REPORT*  Clinical history:History of right lung spindle cell tumor resection.  The patient has new pleural-based masses.  The patient needs a tissue diagnosis.  PROCEDURE(S): CT GUIDED BIOPSY OF A RIGHT PLEURAL MASS  Physician: Rachelle Hora. Henn, MD  Medications:Versed 1 mg, Fentanyl 50 mcg. A radiology nurse monitored the patient for moderate sedation.  Moderate sedation time:10 minutes  Procedure:The procedure was explained to the patient.  The risks and benefits of the procedure were discussed and the patient's questions were addressed.  Informed consent was obtained from the patient.  The patient was placed on her left side.  CT images through the chest were obtained.  Three pleural based lesions were identified and the lesion in the right lower chest was selected for biopsy.  The right mid axillary region was prepped and draped in a sterile fashion.  The skin was anesthetized with lidocaine.  17 gauge guide needle was directed into the pleural based lesion with CT guidance.  Four core biopsies were obtained with an 18 gauge core device.  Samples placed in formalin.  17 gauge needle was removed without complication.  Findings:Right pleural based masses.  The lesion in the lower right lateral chest was biopsied.  The biopsied lesion roughly measures 3.2 cm in maximum diameter.  No evidence for a pneumothorax after the biopsy.  Complications: None  Impression:CT guided core biopsies of a pleural-based mass in the right lower chest.   Original Report Authenticated By: Richarda Overlie, M.D.    ASSESSMENT: This is a very pleasant 79 years old African American female with recurrent spindle cell carcinoma, sarcomatoid carcinoma of the right lung with multiple right lung pleural based nodules.    PLAN: I have a lengthy discussion with the patient today about her condition. I recommended for the patient to see Dr. Kathrynn Running for consideration of  palliative radiotherapy to this area. He will see her at the multidisciplinary thoracic oncology clinic today for consideration of palliative radiotherapy. I would  not consider the patient for systemic chemotherapy for now but I may consider this in the future if she developed any other disease recurrence. I will arrange for the patient to have repeat CT scan of the chest in 3 months for restaging of her disease. The patient agreed to the current plan. She was advised to call immediately if she has any concerning symptoms in the interval.  All questions were answered. The patient knows to call the clinic with any problems, questions or concerns. We can certainly see the patient much sooner if necessary.  Thank you so much for allowing me to participate in the care of [name]. I will continue to follow up the patient with you and assist in her care.  I spent 35 minutes counseling the patient face to face. The total time spent in the appointment was 50 minutes.   Marcella Charlson K. 11/04/2012, 4:00 PM

## 2012-11-04 NOTE — Patient Instructions (Addendum)
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 Recommended for palliative radiotherapy for now. Followup visit in 3 months with repeat CT scan of the chest.

## 2012-11-04 NOTE — Telephone Encounter (Signed)
gave pt appt for October 2014 lab and MD, printed AVS

## 2012-11-04 NOTE — Progress Notes (Signed)
[facility] Clinical Social Work  Clinical Social Work met with patient and MD during MTOC today. The patient scored a 0 on the distress thermometer and stated "why would I bother worrying about anything?".   The patient was previously diagnosed with gastric cancer and is familiar with radiation and chemotherapy treatments.  She has no concerns at this time. CSW encouraged patient to call CSW with any questions or concerns.    Kathrin Penner, MSW, LCSW Clinical Social Worker [facility] [phone]

## 2012-11-07 ENCOUNTER — Encounter: Payer: Self-pay | Admitting: Radiation Oncology

## 2012-11-07 NOTE — Progress Notes (Signed)
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 female who was treated for locally advanced adenocarcinoma of the stomach status post resection in July of 2012 under the care of Dr. Elige Radon followed by concurrent chemoradiation under the care of Dr. Mitzi Hansen and Dr. Dalene Carrow. In routine follow up scan of the chest, abdomen and pelvis on 07/23/2011 there was a new 2.8 x 1.9 CM mass in the apex of the right upper lobe. A PET scan on 08/11/2011 confirmed a 3.2 x 2.1 CM pleural based hypermetabolic mass in the medial aspect of the right lung apex consistent with malignancy. On 09/16/2011 the patient underwent wedge resection of the right upper lobe under the care of Dr. Tyrone Sage. The final pathology was consistent with spindle cell carcinoma. The patient was followed by observation. CT scan of the chest without contrast on 10/21/2012 showed interval development of multi focal pleural base lesions within the right lung. Index right upper lobe subpleural lung lesion measures 2.5 cm. Within the lateral right upper lobe there is a lesion measuring 3.6 x 1.5 cm. There is associated the erosive changes involving the adjacent fourth rib. Lesion overlying the right lower lobe measures 3.9 x 1.6 cm. This also demonstrates destructive and erosive changes involving the overlying 7th rib. This was followed by a PET scan on 10/27/2012 and it showed multiple right-sided pleural based masses consistent with metastatic disease. There is a chest wall invasion of the more inferior and lateral lesion  with rib destruction but no evidence of extrathoracic metastatic disease.  On 11/01/2012 the patient underwent CT-guided biopsy of a right pleural mass and the final pathology showed recurrent poorly differentiated carcinoma. The biopsies are composed of malignant spindle cells with pleomorphic nuclei and dense stroma. The morphologic features are consistent with recurrent spindle cell/ sarcomatoid carcinoma.  The patient was referred to me today for evaluation and recommendation regarding treatment of her condition.   PREVIOUS RADIATION THERAPY: Yes as above  PAST MEDICAL HISTORY:  has a past medical history of Dyslipidemia; Arthritis; Shortness of breath; COPD (chronic obstructive pulmonary disease); Anemia; History of chemotherapy; Lung mass; Gastric cancer (November 08, 2010); and History of radiation therapy (01/13/11-02/20/11).    PAST SURGICAL HISTORY: Past Surgical History  Procedure Laterality Date  . Esophagogastrectomy, j tube 11/08/2010    . Cholecystectomy    . Abdominal hysterectomy    . Cholecystectomy    . Cataract extraction      RIGHT EYE   . Port-a-cath removal  05/13/2011    Procedure: REMOVAL PORT-A-CATH;  Surgeon: Almond Lint, MD;  Location: [facility] OR;  Service: General;  Laterality: N/A;  Removal port-a-cath.  . Portacath placement    . Eye surgery    . Bronchoscopy  08/28/11    rt upper lung mass bx  . Cardia resected  11/2010  . Wedge resection  09/18/11    right vats Dr. Sheliah Plane    FAMILY HISTORY: family history includes Cancer in her sister.  SOCIAL HISTORY:  reports that she quit smoking about 24 years ago. Her smoking use included Cigarettes. She has a 7.5  pack-year smoking history. She has never used smokeless tobacco. She reports that she does not drink alcohol or use illicit drugs.  ALLERGIES: Review of patient's allergies indicates no known allergies.  MEDICATIONS:  Current Outpatient Prescriptions  Medication Sig Dispense Refill  . albuterol (PROVENTIL  HFA;VENTOLIN HFA) 108 (90 BASE) MCG/ACT inhaler Inhale 3 puffs into the lungs every 4 (four) hours as needed. For shortness of breath       . calcium-vitamin D (OSCAL WITH D) 500-200 MG-UNIT per tablet Take 1 tablet by mouth every other day.       . cholecalciferol (VITAMIN D) 1000 UNITS tablet Take 1,000 Units by mouth daily.      . Cyanocobalamin (B-12) 100 MCG TABS Take 1 tablet by mouth as needed.       . Ferrous Sulfate (IRON) 325 (65 FE) MG TABS Take 1 tablet by mouth daily.       . fish oil-omega-3 fatty acids 1000 MG capsule Take 1 g by mouth daily.       . hydrOXYzine (ATARAX) 10 MG tablet Take 10 mg by mouth daily as needed. For itching.      . mirtazapine (REMERON) 15 MG tablet Take 15 mg by mouth at bedtime.      . Multiple Vitamins-Minerals (CENTRUM SILVER PO) Take 1 tablet by mouth daily.       . Multiple Vitamins-Minerals (ICAPS PO) Take 1 tablet by mouth every other day.        No current facility-administered medications for this encounter.    REVIEW OF SYSTEMS:  A 15 point review of systems is documented in the electronic medical record. This was obtained by the nursing staff. However, I reviewed this with the patient to discuss relevant findings and make appropriate changes.  A comprehensive review of systems was negative.   PHYSICAL EXAM:   Per Mohamed RUE:AVWUJ, healthy, no distress, well nourished and well developed  SKIN: skin color, texture, turgor are normal  HEAD: Normocephalic  EYES: normal, PERRLA  EARS: External ears normal  OROPHARYNX:no exudate and no erythema  NECK: supple, no adenopathy  LYMPH: no palpable lymphadenopathy, no hepatosplenomegaly  BREAST:not examined  LUNGS: clear to auscultation  HEART: regular rate & rhythm, no murmurs and no gallops  ABDOMEN:abdomen soft, non-tender, normal bowel sounds and no masses or organomegaly  BACK: Back symmetric, no curvature.  EXTREMITIES:no joint deformities, effusion, or inflammation, no edema, no skin  discoloration  NEURO: alert & oriented x 3 with fluent speech, no focal motor/sensory deficits   LABORATORY DATA:  Lab Results  Component Value Date   WBC 4.3 11/01/2012   HGB 12.0 11/01/2012   HCT 37.5 11/01/2012   MCV 88.4 11/01/2012   PLT 175 11/01/2012   Lab Results  Component Value Date   NA 140 09/17/2011   K 3.6 09/17/2011   CL 108 09/17/2011   CO2 26 09/17/2011   Lab Results  Component Value Date   ALT 37* 09/17/2011   AST 46* 09/17/2011   ALKPHOS 100 09/17/2011   BILITOT 0.5 09/17/2011     RADIOGRAPHY: Dg Chest 1 View  11/01/2012   *RADIOLOGY REPORT*  Clinical Data: 79 year old female status post right chest biopsy.  CHEST - 1 VIEW  Comparison: CT guided biopsy images 1209 hours the same day and earlier.  Findings: Upright AP portable view at 1355 hours.  No pneumothorax identified.  Peripheral increased opacity in the right apex and along the right lateral chest wall.  Stable lung volumes.  No  pulmonary edema or definite effusion.  Stable cardiac size and mediastinal contours.  Stable visualized osseous structures.  IMPRESSION: No pneumothorax identified status post right  chest biopsy of pleural based mass.   Original Report Authenticated By: Erskine Speed, M.D.   Ct Chest Wo Contrast  10/21/2012   *RADIOLOGY REPORT*  Clinical Data: Evaluate lung lesion  CT CHEST WITHOUT CONTRAST  Technique:  Multidetector CT imaging of the chest was performed following the standard protocol without IV contrast.  Comparison: 03/25/2012  Findings: There is no pleural effusion identified.  Moderate changes of centrilobular emphysema identified.  Interval development of multi focal pleural base lesions within the right lung.  Index right upper lobe subpleural lung lesion measures 2.5 cm, image 13/series 4.  Within the lateral right upper lobe there is a lesion measuring 3.6 x 1.5 cm, image 24/series 4. There is associated the erosive changes involving the adjacent fourth rib. Lesion overlying the right lower  lobe measures 3.9 x 1.6 cm. This also demonstrates destructive and erosive changes involving the overlying 7th rib.  There is no pleural effusion identified.  The left lung appears clear.  Heart size appears within normal limits.  No enlarged mediastinal or hilar lymph nodes.  No pericardial effusion.  Postsurgical changes involving the stomach noted.  The esophagus appears dilated and fluid-filled.  Limited imaging through the upper abdomen shows a calcified granuloma within the right lobe of liver.  IMPRESSION:  1.  Multi focal pleural based lesions within the right hemithorax are noted and are concerning for tumor recurrence. 2.  Evidence of direct tumor extension and involvement of the right fourth and seventh ribs. 3.  Postoperative changes involving the stomach with dilated and fluid-filled esophagus.   Original Report Authenticated By: Signa Kell, M.D.   Nm Pet Image Restag (ps) Skull Base To Thigh  10/27/2012   *RADIOLOGY REPORT*  Clinical Data: Subsequent treatment strategy for chest wall mass with history of lung cancer. Gastric cancer.  NUCLEAR MEDICINE PET SKULL BASE TO THIGH  Fasting Blood Glucose:  75  Technique:  18.5 mCi F-18 FDG was injected intravenously. CT data was obtained and used for attenuation correction and anatomic localization only.  (This was not acquired as a diagnostic CT examination.) Additional exam technical data entered on technologist worksheet.  Comparison:  Chest CT 10/21/2012.  PET 08/11/2011  Findings:  Neck: No abnormal hypermetabolism.  Chest:  Minimal right apical pleural nodularity and hypermetabolism on image 42/series 2.  This measures a S.U.V. max of 4.1. 3 right-sided pleural based masses. An anterior lesion measures 2.7 cm and a S.U.V. max of 7.5 on image 53/series 2. More inferior anterior lesion measures 2.1 x 3.8 cm and a S.U.V. max of 8.9 on image 73. More inferior and lateral lesion measures 1.8 x 3.2 cm and a S.U.V. max of 9.2 on image 84.  This  demonstrates direct chest wall invasion and destruction of the seventh lateral right rib.  Abdomen/Pelvis:  Surgical changes at the gastroesophageal junction with wall thickening involving the remaining stomach.  This is likely chronic.  There is focal hypermetabolism in the region of the antrum which measures 5.4 cm on image 117.  Skeleton:  No hypermetabolic osseous metastasis.  CT  images performed for attenuation correction demonstrate no significant findings within the neck.  Chest findings deferred to recent diagnostic CT.  Development of trace bilateral pleural effusions.  Coronary artery atherosclerosis.  IMPRESSION:  1.  Multiple right-sided pleural based masses, most consistent with metastatic disease.  As described on the prior CT, there is chest wall invasion of the more inferior and lateral lesion with rib destruction. 2.  No evidence of extrathoracic metastatic disease.  3.  Favor physiologic hypermetabolism within the gastric antrum. Concurrent wall thickening for which gastritis cannot be excluded. 4.  Development of bilateral pleural effusions since 10/21/2012.   Original Report Authenticated By: Jeronimo Greaves, M.D.   Ct Biopsy  11/01/2012   *RADIOLOGY REPORT*  Clinical history:History of right lung spindle cell tumor resection.  The patient has new pleural-based masses.  The patient needs a tissue diagnosis.  PROCEDURE(S): CT GUIDED BIOPSY OF A RIGHT PLEURAL MASS  Physician: Rachelle Hora. Henn, MD  Medications:Versed 1 mg, Fentanyl 50 mcg. A radiology nurse monitored the patient for moderate sedation.  Moderate sedation time:10 minutes  Procedure:The procedure was explained to the patient.  The risks and benefits of the procedure were discussed and the patient's questions were addressed.  Informed consent was obtained from the patient.  The patient was placed on her left side.  CT images through the chest were obtained.  Three pleural based lesions were identified and the lesion in the right lower chest  was selected for biopsy.  The right mid axillary region was prepped and draped in a sterile fashion.  The skin was anesthetized with lidocaine.  17 gauge guide needle was directed into the pleural based lesion with CT guidance.  Four core biopsies were obtained with an 18 gauge core device.  Samples placed in formalin.  17 gauge needle was removed without complication.  Findings:Right pleural based masses.  The lesion in the lower right lateral chest was biopsied.  The biopsied lesion roughly measures 3.2 cm in maximum diameter.  No evidence for a pneumothorax after the biopsy.  Complications: None  Impression:CT guided core biopsies of a pleural-based mass in the right lower chest.   Original Report Authenticated By: Richarda Overlie, M.D.     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 The patient may benefit from localized radiotherapy to her sites of active disease for palliation of pain.  PLAN:Today, I talked to the patient and family about the findings and work-up thus far.  We discussed the natural history of disease and general treatment, highlighting the role or radiotherapy in the management.  We discussed the available radiation techniques, and focused on the details of logistics and delivery.  We reviewed the anticipated acute and late sequelae associated with radiation in this setting.  The patient was encouraged to ask questions that I answered to the best of my ability.  I filled out a patient counseling form during our discussion including treatment diagrams.  We retained a copy for our records.  The patient would like to proceed with radiation and will be scheduled for CT simulation.  I spent 60 minutes minutes face to face with the patient and more than 50% of that time was spent in counseling and/or coordination of care.     ------------------------------------------------  Artist Pais. Kathrynn Running, M.D.

## 2012-11-12 ENCOUNTER — Ambulatory Visit
Admission: RE | Admit: 2012-11-12 | Discharge: 2012-11-12 | Disposition: A | Discharge status: Home / Self Care | Payer: Medicare Other | Source: Ambulatory Visit | Attending: Radiation Oncology | Admitting: Radiation Oncology

## 2012-11-12 DIAGNOSIS — C801 Malignant (primary) neoplasm, unspecified: Secondary | ICD-10-CM

## 2012-11-12 DIAGNOSIS — Z510 Encounter for antineoplastic radiation therapy: Secondary | ICD-10-CM | POA: Insufficient documentation

## 2012-11-12 DIAGNOSIS — C3490 Malignant neoplasm of unspecified part of unspecified bronchus or lung: Secondary | ICD-10-CM | POA: Insufficient documentation

## 2012-11-12 NOTE — Progress Notes (Signed)
  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  Identity was confirmed.  All relevant records and images related to the planned course of therapy were reviewed.  The patient freely provided informed written consent to proceed with treatment after reviewing the details related to the planned course of therapy. The consent form was witnessed and verified by the simulation staff.  Then, the patient was set-up in a stable reproducible  supine position for radiation therapy.  CT images were obtained.  Surface markings were placed.  The CT images were loaded into the planning software.  Then the target and avoidance structures were contoured.  Treatment planning then occurred.  The radiation prescription was entered and confirmed.  Then, I designed and supervised the construction of a total of 1 medically necessary complex treatment device in the form of a body fix pillow.  I have requested : Intensity Modulated Radiotherapy (IMRT) is medically necessary for this case for the following reason:  The tumor involves multiple sites of the chest wall wrapping in a concave manner around the healthy lung and conventional radiation will likely exceed lung tolerance, but, I will compare 3D and IMRT plans.   PLAN:  The patient will receive 66 Gy in 33 fractions.  ________________________________  Artist Pais Kathrynn Running, M.D.

## 2012-11-23 ENCOUNTER — Ambulatory Visit: Payer: Medicare Other | Admitting: Radiation Oncology

## 2012-11-24 ENCOUNTER — Ambulatory Visit
Admission: RE | Admit: 2012-11-24 | Discharge: 2012-11-24 | Disposition: A | Discharge status: Home / Self Care | Payer: Medicare Other | Source: Ambulatory Visit | Attending: Radiation Oncology | Admitting: Radiation Oncology

## 2012-11-25 ENCOUNTER — Encounter: Payer: Self-pay | Admitting: Radiation Oncology

## 2012-11-25 ENCOUNTER — Ambulatory Visit
Admission: RE | Admit: 2012-11-25 | Discharge: 2012-11-25 | Disposition: A | Discharge status: Home / Self Care | Payer: Medicare Other | Source: Ambulatory Visit | Attending: Radiation Oncology | Admitting: Radiation Oncology

## 2012-11-25 NOTE — Progress Notes (Signed)
  Radiation Oncology         [phone] ________________________________  Name: [name] MRN: [id number]  Date: 11/26/2012  DOB: [birth year]  Weekly Radiation Therapy Management  Current Dose: 6 Gy     Planned Dose:  66 Gy  Narrative . . . . . . . . The patient presents for routine under treatment assessment.                                                        The patient is without complaint.                                 Set-up films were reviewed.                                 The chart was checked. Physical Findings. . .  weight is 97 lb 14.4 oz (44.407 kg). Her blood pressure is 136/86 and her pulse is 63. Her respiration is 18. . Weight essentially stable.  No significant changes. Impression . . . . . . . The patient is tolerating radiation. Plan . . . . . . . . . . . . Continue treatment as planned.  ________________________________  Artist Pais. Kathrynn Running, M.D.

## 2012-11-26 ENCOUNTER — Encounter: Payer: Self-pay | Admitting: Radiation Oncology

## 2012-11-26 ENCOUNTER — Ambulatory Visit
Admission: RE | Admit: 2012-11-26 | Discharge: 2012-11-26 | Disposition: A | Discharge status: Home / Self Care | Payer: Medicare Other | Source: Ambulatory Visit | Attending: Radiation Oncology | Admitting: Radiation Oncology

## 2012-11-26 VITALS — BP 136/86 | HR 63 | Resp 18 | Wt 97.9 lb

## 2012-11-26 DIAGNOSIS — C801 Malignant (primary) neoplasm, unspecified: Secondary | ICD-10-CM

## 2012-11-26 DIAGNOSIS — R918 Other nonspecific abnormal finding of lung field: Secondary | ICD-10-CM

## 2012-11-26 MED ORDER — RADIAPLEXRX EX GEL
Freq: Once | CUTANEOUS | Status: AC
  Administered 2012-11-26: 12:00:00 via TOPICAL

## 2012-11-26 NOTE — Progress Notes (Signed)
Denies cough. Reports shortness of breath with exertion. Denies pain or difficulty swallowing. Denies fatigue. Reports that she works in her yard and tending to her mother's house regularly. Denies nausea, vomiting, headache or dizziness.

## 2012-11-29 ENCOUNTER — Ambulatory Visit
Admission: RE | Admit: 2012-11-29 | Discharge: 2012-11-29 | Disposition: A | Discharge status: Home / Self Care | Payer: Medicare Other | Source: Ambulatory Visit | Attending: Radiation Oncology | Admitting: Radiation Oncology

## 2012-11-30 ENCOUNTER — Ambulatory Visit
Admission: RE | Admit: 2012-11-30 | Discharge: 2012-11-30 | Disposition: A | Discharge status: Home / Self Care | Payer: Medicare Other | Source: Ambulatory Visit | Attending: Radiation Oncology | Admitting: Radiation Oncology

## 2012-11-30 NOTE — Addendum Note (Signed)
Encounter addended by: Agnes Lawrence, RN on: 11/30/2012 11:50 AM<BR>     Documentation filed: Inpatient Document Flowsheet, Notes Section, Inpatient Patient Education, Chief Complaint Section

## 2012-11-30 NOTE — Progress Notes (Signed)
Late entry 11/26/2012. Oriented patient to staff and routine of the clinic. Provided patient with RADIATION THERAPY AND YOU handbook then, reviewed pertinent information. Educated patient on potential side effects and management such as, fatigue. Skin changes, worsening cough, etc. Provided patient with radiaplex gel and directed upon use. All questions answered. Patient verbalized understanding of all reviewed.

## 2012-12-01 ENCOUNTER — Ambulatory Visit
Admission: RE | Admit: 2012-12-01 | Discharge: 2012-12-01 | Disposition: A | Discharge status: Home / Self Care | Payer: Medicare Other | Source: Ambulatory Visit | Attending: Radiation Oncology | Admitting: Radiation Oncology

## 2012-12-02 ENCOUNTER — Ambulatory Visit
Admission: RE | Admit: 2012-12-02 | Discharge: 2012-12-02 | Disposition: A | Discharge status: Home / Self Care | Payer: Medicare Other | Source: Ambulatory Visit | Attending: Radiation Oncology | Admitting: Radiation Oncology

## 2012-12-03 ENCOUNTER — Encounter: Payer: Self-pay | Admitting: Radiation Oncology

## 2012-12-03 ENCOUNTER — Ambulatory Visit
Admission: RE | Admit: 2012-12-03 | Discharge: 2012-12-03 | Disposition: A | Discharge status: Home / Self Care | Payer: Medicare Other | Source: Ambulatory Visit | Attending: Radiation Oncology | Admitting: Radiation Oncology

## 2012-12-03 VITALS — BP 160/90 | HR 67 | Temp 97.8°F | Resp 20 | Wt 100.3 lb

## 2012-12-03 DIAGNOSIS — C801 Malignant (primary) neoplasm, unspecified: Secondary | ICD-10-CM

## 2012-12-03 NOTE — Progress Notes (Signed)
   Department of Radiation Oncology  Phone:  [phone] Fax:        [phone]  Weekly Treatment Note    Name: [name] Date: 12/03/2012 MRN: [id number] DOB: [birth year]   Current dose: 16 Gy  Current fraction: 8   MEDICATIONS: Current Outpatient Prescriptions  Medication Sig Dispense Refill  . albuterol (PROVENTIL HFA;VENTOLIN HFA) 108 (90 BASE) MCG/ACT inhaler Inhale 3 puffs into the lungs every 4 (four) hours as needed. For shortness of breath       . calcium-vitamin D (OSCAL WITH D) 500-200 MG-UNIT per tablet Take 1 tablet by mouth every other day.       . cholecalciferol (VITAMIN D) 1000 UNITS tablet Take 1,000 Units by mouth daily.      . Cyanocobalamin (B-12) 100 MCG TABS Take 1 tablet by mouth as needed.       . Ferrous Sulfate (IRON) 325 (65 FE) MG TABS Take 1 tablet by mouth daily.       . fish oil-omega-3 fatty acids 1000 MG capsule Take 1 g by mouth daily.       . hydrOXYzine (ATARAX) 10 MG tablet Take 10 mg by mouth daily as needed. For itching.      . mirtazapine (REMERON) 15 MG tablet Take 15 mg by mouth at bedtime.      . Multiple Vitamins-Minerals (CENTRUM SILVER PO) Take 1 tablet by mouth daily.       . Multiple Vitamins-Minerals (ICAPS PO) Take 1 tablet by mouth every other day.       . Wound Cleansers (RADIAPLEX EX) Apply topically.       No current facility-administered medications for this encounter.     ALLERGIES: Review of patient's allergies indicates no known allergies.   LABORATORY DATA:  Lab Results  Component Value Date   WBC 4.3 11/01/2012   HGB 12.0 11/01/2012   HCT 37.5 11/01/2012   MCV 88.4 11/01/2012   PLT 175 11/01/2012   Lab Results  Component Value Date   NA 140 09/17/2011   K 3.6 09/17/2011   CL 108 09/17/2011   CO2 26 09/17/2011   Lab Results  Component Value Date   ALT 37* 09/17/2011   AST 46* 09/17/2011   ALKPHOS 100 09/17/2011   BILITOT 0.5 09/17/2011     NARRATIVE: [name] was seen today for weekly  treatment management. The chart was checked and the patient's films were reviewed. The patient states that she is doing very well. She has gained a couple of pounds over the last week. No nausea. No pain and no significant shortness of breath at this time.  PHYSICAL EXAMINATION: weight is 100 lb 4.8 oz (45.496 kg). Her oral temperature is 97.8 F (36.6 C). Her blood pressure is 160/90 and her pulse is 67. Her respiration is 20 and oxygen saturation is 99%.        ASSESSMENT: The patient is doing satisfactorily with treatment.  PLAN: We will continue with the patient's radiation treatment as planned.

## 2012-12-03 NOTE — Progress Notes (Addendum)
Weekly rad txs, 8/33 txs done, no c/o pain, no difficulty swallowing, or chewing, no nausea, couphs up a lttle pheglm at nighj, gained 2 lbs since last week, in good spirits female friend visiting this past week, has radiaplex  Using daily  No skin changes, no fatigue 10:32 AM

## 2012-12-06 ENCOUNTER — Ambulatory Visit
Admission: RE | Admit: 2012-12-06 | Discharge: 2012-12-06 | Disposition: A | Discharge status: Home / Self Care | Payer: Medicare Other | Source: Ambulatory Visit | Attending: Radiation Oncology | Admitting: Radiation Oncology

## 2012-12-07 ENCOUNTER — Ambulatory Visit
Admission: RE | Admit: 2012-12-07 | Discharge: 2012-12-07 | Disposition: A | Discharge status: Home / Self Care | Payer: Medicare Other | Source: Ambulatory Visit | Attending: Radiation Oncology | Admitting: Radiation Oncology

## 2012-12-08 ENCOUNTER — Ambulatory Visit
Admission: RE | Admit: 2012-12-08 | Discharge: 2012-12-08 | Disposition: A | Discharge status: Home / Self Care | Payer: Medicare Other | Source: Ambulatory Visit | Attending: Radiation Oncology | Admitting: Radiation Oncology

## 2012-12-09 ENCOUNTER — Ambulatory Visit
Admission: RE | Admit: 2012-12-09 | Discharge: 2012-12-09 | Disposition: A | Discharge status: Home / Self Care | Payer: Medicare Other | Source: Ambulatory Visit | Attending: Radiation Oncology | Admitting: Radiation Oncology

## 2012-12-10 ENCOUNTER — Ambulatory Visit
Admission: RE | Admit: 2012-12-10 | Discharge: 2012-12-10 | Disposition: A | Discharge status: Home / Self Care | Payer: Medicare Other | Source: Ambulatory Visit | Attending: Radiation Oncology | Admitting: Radiation Oncology

## 2012-12-10 ENCOUNTER — Encounter: Payer: Self-pay | Admitting: Radiation Oncology

## 2012-12-10 VITALS — BP 130/93 | HR 66 | Temp 97.3°F | Ht 59.0 in | Wt 98.2 lb

## 2012-12-10 DIAGNOSIS — C801 Malignant (primary) neoplasm, unspecified: Secondary | ICD-10-CM

## 2012-12-10 NOTE — Progress Notes (Signed)
[name] here for weekly under treat visit.  She has had 13 fractions to her right lung.  She does have occasional sharp pains in her right side.  She does have a cough and is brining up clear - yellowish sputum.  She denies shortness of breath, throat pain and fatigue.  Her skin is intact in the treatment area.  She is using radiaplex gel twice a day.

## 2012-12-10 NOTE — Progress Notes (Signed)
  Radiation Oncology         [phone] ________________________________  Name: [name] MRN: [id number]  Date: 12/10/2012  DOB: Dec 24, 1930  Weekly Radiation Therapy Management  Current Dose: 26 Gy     Planned Dose:  66 Gy  Narrative . . . . . . . . The patient presents for routine under treatment assessment.                                                      The patient is without complaint.                                 Set-up films were reviewed.                                 The chart was checked. Physical Findings. . .  height is 4\' 11"  (1.499 m) and weight is 98 lb 3.2 oz (44.543 kg). Her temperature is 97.3 F (36.3 C). Her blood pressure is 130/93 and her pulse is 66. Her oxygen saturation is 97%. . Weight essentially stable.  No significant changes. Impression . . . . . . . The patient is  tolerating radiation. Plan . . . . . . . . . . . . Continue treatment as planned.  ________________________________  Artist Pais. Kathrynn Running, M.D.

## 2012-12-13 ENCOUNTER — Ambulatory Visit
Admission: RE | Admit: 2012-12-13 | Discharge: 2012-12-13 | Disposition: A | Discharge status: Home / Self Care | Payer: Medicare Other | Source: Ambulatory Visit | Attending: Radiation Oncology | Admitting: Radiation Oncology

## 2012-12-14 ENCOUNTER — Ambulatory Visit
Admission: RE | Admit: 2012-12-14 | Discharge: 2012-12-14 | Disposition: A | Discharge status: Home / Self Care | Payer: Medicare Other | Source: Ambulatory Visit | Attending: Radiation Oncology | Admitting: Radiation Oncology

## 2012-12-15 ENCOUNTER — Ambulatory Visit
Admission: RE | Admit: 2012-12-15 | Discharge: 2012-12-15 | Disposition: A | Discharge status: Home / Self Care | Payer: Medicare Other | Source: Ambulatory Visit | Attending: Radiation Oncology | Admitting: Radiation Oncology

## 2012-12-15 VITALS — BP 145/79 | HR 62 | Temp 98.3°F | Ht 59.0 in | Wt 98.0 lb

## 2012-12-15 DIAGNOSIS — C801 Malignant (primary) neoplasm, unspecified: Secondary | ICD-10-CM

## 2012-12-15 MED ORDER — RADIAPLEXRX EX GEL
Freq: Once | CUTANEOUS | Status: AC
  Administered 2012-12-15: 11:00:00 via TOPICAL

## 2012-12-15 NOTE — Progress Notes (Signed)
   Department of Radiation Oncology  Phone:  [phone] Fax:        [phone]  Weekly Treatment Note    Name: [name] Date: 12/15/2012 MRN: [id number] DOB: Dec 27, 1930   Current dose: 32 Gy  Current fraction: 16   MEDICATIONS: Current Outpatient Prescriptions  Medication Sig Dispense Refill  . albuterol (PROVENTIL HFA;VENTOLIN HFA) 108 (90 BASE) MCG/ACT inhaler Inhale 3 puffs into the lungs every 4 (four) hours as needed. For shortness of breath       . calcium-vitamin D (OSCAL WITH D) 500-200 MG-UNIT per tablet Take 1 tablet by mouth every other day.       . cholecalciferol (VITAMIN D) 1000 UNITS tablet Take 1,000 Units by mouth daily.      . Cyanocobalamin (B-12) 100 MCG TABS Take 1 tablet by mouth as needed.       . Ferrous Sulfate (IRON) 325 (65 FE) MG TABS Take 1 tablet by mouth daily.       . fish oil-omega-3 fatty acids 1000 MG capsule Take 1 g by mouth daily.       . hydrOXYzine (ATARAX) 10 MG tablet Take 10 mg by mouth daily as needed. For itching.      . mirtazapine (REMERON) 15 MG tablet Take 15 mg by mouth at bedtime.      . Multiple Vitamins-Minerals (CENTRUM SILVER PO) Take 1 tablet by mouth daily.       . Multiple Vitamins-Minerals (ICAPS PO) Take 1 tablet by mouth every other day.       . Wound Cleansers (RADIAPLEX EX) Apply topically.       No current facility-administered medications for this encounter.     ALLERGIES: Review of patient's allergies indicates no known allergies.   LABORATORY DATA:  Lab Results  Component Value Date   WBC 4.3 11/01/2012   HGB 12.0 11/01/2012   HCT 37.5 11/01/2012   MCV 88.4 11/01/2012   PLT 175 11/01/2012   Lab Results  Component Value Date   NA 140 09/17/2011   K 3.6 09/17/2011   CL 108 09/17/2011   CO2 26 09/17/2011   Lab Results  Component Value Date   ALT 37* 09/17/2011   AST 46* 09/17/2011   ALKPHOS 100 09/17/2011   BILITOT 0.5 09/17/2011     NARRATIVE: [name] was seen today for weekly  treatment management. The chart was checked and the patient's films were reviewed. The patient is doing very well at this time. She has no significant complaints. She denies any esophagitis and also states that she is not having any fatigue.  PHYSICAL EXAMINATION: height is 4\' 11"  (1.499 m) and weight is 98 lb (44.453 kg). Her temperature is 98.3 F (36.8 C). Her blood pressure is 145/79 and her pulse is 62. Her oxygen saturation is 99%.        ASSESSMENT: The patient is doing satisfactorily with treatment.  PLAN: We will continue with the patient's radiation treatment as planned.

## 2012-12-15 NOTE — Progress Notes (Signed)
Larey Brick here for weekly under treat visit.  She has had 16 fractions to her right lung.  She denies pain.  She does have shortness of breath with activity.  She has a cough in the mornings.  She denies a sore throat.  She denies fatigue.  Her skin is intact in the treatment area.  She needs a refill of the radiaplex.  Another tube has been given.

## 2012-12-16 ENCOUNTER — Ambulatory Visit
Admission: RE | Admit: 2012-12-16 | Discharge: 2012-12-16 | Disposition: A | Discharge status: Home / Self Care | Payer: Medicare Other | Source: Ambulatory Visit | Attending: Radiation Oncology | Admitting: Radiation Oncology

## 2012-12-17 ENCOUNTER — Ambulatory Visit
Admission: RE | Admit: 2012-12-17 | Discharge: 2012-12-17 | Disposition: A | Discharge status: Home / Self Care | Payer: Medicare Other | Source: Ambulatory Visit | Attending: Radiation Oncology | Admitting: Radiation Oncology

## 2012-12-20 ENCOUNTER — Ambulatory Visit
Admission: RE | Admit: 2012-12-20 | Discharge: 2012-12-20 | Disposition: A | Discharge status: Home / Self Care | Payer: Medicare Other | Source: Ambulatory Visit | Attending: Radiation Oncology | Admitting: Radiation Oncology

## 2012-12-21 ENCOUNTER — Ambulatory Visit
Admission: RE | Admit: 2012-12-21 | Discharge: 2012-12-21 | Disposition: A | Discharge status: Home / Self Care | Payer: Medicare Other | Source: Ambulatory Visit | Attending: Radiation Oncology | Admitting: Radiation Oncology

## 2012-12-22 ENCOUNTER — Ambulatory Visit
Admission: RE | Admit: 2012-12-22 | Discharge: 2012-12-22 | Disposition: A | Discharge status: Home / Self Care | Payer: Medicare Other | Source: Ambulatory Visit | Attending: Radiation Oncology | Admitting: Radiation Oncology

## 2012-12-23 ENCOUNTER — Ambulatory Visit
Admission: RE | Admit: 2012-12-23 | Discharge: 2012-12-23 | Disposition: A | Discharge status: Home / Self Care | Payer: Medicare Other | Source: Ambulatory Visit | Attending: Radiation Oncology | Admitting: Radiation Oncology

## 2012-12-24 ENCOUNTER — Ambulatory Visit
Admission: RE | Admit: 2012-12-24 | Discharge: 2012-12-24 | Disposition: A | Discharge status: Home / Self Care | Payer: Medicare Other | Source: Ambulatory Visit | Attending: Radiation Oncology | Admitting: Radiation Oncology

## 2012-12-24 ENCOUNTER — Encounter: Payer: Self-pay | Admitting: Radiation Oncology

## 2012-12-24 VITALS — BP 142/80 | HR 76 | Temp 97.4°F | Resp 18 | Wt 97.9 lb

## 2012-12-24 DIAGNOSIS — C801 Malignant (primary) neoplasm, unspecified: Secondary | ICD-10-CM

## 2012-12-24 NOTE — Progress Notes (Signed)
  Radiation Oncology         [phone] ________________________________  Name: [name] MRN: [id number]  Date: 12/24/2012  DOB: [birth year]  Weekly Radiation Therapy Management  Current Dose: 44 Gy     Planned Dose:  66 Gy  Narrative . . . . . . . . The patient presents for routine under treatment assessment.                                                      The patient is without complaint.                                 Set-up films were reviewed and her masses are shrinking.                                 The chart was checked. Physical Findings. . .  weight is 97 lb 14.4 oz (44.407 kg). Her oral temperature is 97.4 F (36.3 C). Her blood pressure is 142/80 and her pulse is 76. Her respiration is 18 and oxygen saturation is 98%. . Weight essentially stable.  No significant changes. Impression . . . . . . . The patient is  tolerating radiation. Plan . . . . . . . . . . . . Continue treatment as planned.  ________________________________  Artist Pais. Kathrynn Running, M.D.

## 2012-12-24 NOTE — Progress Notes (Signed)
Denies pain. She does have shortness of breath with activity. She has a cough in the mornings. She denies a sore throat. She denies fatigue. Her skin is intact in the treatment area. No skin changes noted within treatment area. Reports using Radiaplex bid as directed.

## 2012-12-27 ENCOUNTER — Ambulatory Visit
Admission: RE | Admit: 2012-12-27 | Discharge: 2012-12-27 | Disposition: A | Discharge status: Home / Self Care | Payer: Medicare Other | Source: Ambulatory Visit | Attending: Radiation Oncology | Admitting: Radiation Oncology

## 2012-12-28 ENCOUNTER — Ambulatory Visit
Admission: RE | Admit: 2012-12-28 | Discharge: 2012-12-28 | Disposition: A | Discharge status: Home / Self Care | Payer: Medicare Other | Source: Ambulatory Visit | Attending: Radiation Oncology | Admitting: Radiation Oncology

## 2012-12-29 ENCOUNTER — Ambulatory Visit
Admission: RE | Admit: 2012-12-29 | Discharge: 2012-12-29 | Disposition: A | Discharge status: Home / Self Care | Payer: Medicare Other | Source: Ambulatory Visit | Attending: Radiation Oncology | Admitting: Radiation Oncology

## 2012-12-30 ENCOUNTER — Ambulatory Visit
Admission: RE | Admit: 2012-12-30 | Discharge: 2012-12-30 | Disposition: A | Discharge status: Home / Self Care | Payer: Medicare Other | Source: Ambulatory Visit | Attending: Radiation Oncology | Admitting: Radiation Oncology

## 2012-12-31 ENCOUNTER — Ambulatory Visit
Admission: RE | Admit: 2012-12-31 | Discharge: 2012-12-31 | Disposition: A | Discharge status: Home / Self Care | Payer: Medicare Other | Source: Ambulatory Visit | Attending: Radiation Oncology | Admitting: Radiation Oncology

## 2012-12-31 ENCOUNTER — Encounter: Payer: Self-pay | Admitting: Radiation Oncology

## 2012-12-31 VITALS — BP 131/72 | HR 70 | Resp 16 | Wt 97.1 lb

## 2012-12-31 DIAGNOSIS — C801 Malignant (primary) neoplasm, unspecified: Secondary | ICD-10-CM

## 2012-12-31 NOTE — Progress Notes (Signed)
Weight stable. Denies fatigue. Reports that she is working on taking the floor up in her mother's house. Denies cough or shortness of breath. Denies painful or difficult swallowing. No skin changes noted to chest or back.

## 2012-12-31 NOTE — Progress Notes (Signed)
  Radiation Oncology         [phone] ________________________________  Name: [name] MRN: [id number]  Date: 12/31/2012  DOB: 24-Oct-1930  Weekly Radiation Therapy Management  Current Dose: 56 Gy     Planned Dose:  66 Gy  Narrative . . . . . . . . The patient presents for routine under treatment assessment.                                                      The patient is without complaint.  No pain.                                 Set-up films were reviewed.                                 The chart was checked. Physical Findings. . .  weight is 97 lb 1.6 oz (44.044 kg). Her blood pressure is 131/72 and her pulse is 70. Her respiration is 16 and oxygen saturation is 96%. . Weight essentially stable.  No significant changes. Impression . . . . . . . The patient is  tolerating radiation. Plan . . . . . . . . . . . . Continue treatment as planned.  ________________________________  Artist Pais. Kathrynn Running, M.D.

## 2013-01-04 ENCOUNTER — Ambulatory Visit
Admission: RE | Admit: 2013-01-04 | Discharge: 2013-01-04 | Disposition: A | Discharge status: Home / Self Care | Payer: Medicare Other | Source: Ambulatory Visit | Attending: Radiation Oncology | Admitting: Radiation Oncology

## 2013-01-05 ENCOUNTER — Ambulatory Visit
Admission: RE | Admit: 2013-01-05 | Discharge: 2013-01-05 | Disposition: A | Discharge status: Home / Self Care | Payer: Medicare Other | Source: Ambulatory Visit | Attending: Radiation Oncology | Admitting: Radiation Oncology

## 2013-01-06 ENCOUNTER — Ambulatory Visit
Admission: RE | Admit: 2013-01-06 | Discharge: 2013-01-06 | Disposition: A | Discharge status: Home / Self Care | Payer: Medicare Other | Source: Ambulatory Visit | Attending: Radiation Oncology | Admitting: Radiation Oncology

## 2013-01-07 ENCOUNTER — Ambulatory Visit
Admission: RE | Admit: 2013-01-07 | Discharge: 2013-01-07 | Disposition: A | Discharge status: Home / Self Care | Payer: Medicare Other | Source: Ambulatory Visit | Attending: Radiation Oncology | Admitting: Radiation Oncology

## 2013-01-07 ENCOUNTER — Encounter: Payer: Self-pay | Admitting: Radiation Oncology

## 2013-01-07 VITALS — BP 135/82 | HR 58 | Resp 16 | Wt 96.9 lb

## 2013-01-07 DIAGNOSIS — C801 Malignant (primary) neoplasm, unspecified: Secondary | ICD-10-CM

## 2013-01-07 NOTE — Progress Notes (Signed)
Weight stable. Denies fatigue. Reports that she is working on taking the floor up in her mother's house. Denies cough or shortness of breath. Denies painful or difficult swallowing. No skin changes noted to chest or back. Reports she continues to use radiaplex bid as directed.

## 2013-01-07 NOTE — Progress Notes (Signed)
  Radiation Oncology         [phone] ________________________________  Name: [name] MRN: [id number]  Date: 01/07/2013  DOB: 1930-09-29  Weekly Radiation Therapy Management  Current Dose: 64 Gy     Planned Dose:  66 Gy  Narrative . . . . . . . . The patient presents for routine under treatment assessment.                                                      The patient is without complaint.                                 Set-up films were reviewed.                                 The chart was checked. Physical Findings. . .  weight is 96 lb 14.4 oz (43.954 kg). Her blood pressure is 135/82 and her pulse is 58. Her respiration is 16 and oxygen saturation is 100%. . Weight essentially stable.  No significant changes. Impression . . . . . . . The patient is  tolerating radiation. Plan . . . . . . . . . . . . Continue treatment as planned.  ________________________________  Artist Pais. Kathrynn Running, M.D.

## 2013-01-10 ENCOUNTER — Encounter: Payer: Self-pay | Admitting: Radiation Oncology

## 2013-01-10 ENCOUNTER — Ambulatory Visit
Admission: RE | Admit: 2013-01-10 | Discharge: 2013-01-10 | Disposition: A | Discharge status: Home / Self Care | Payer: Medicare Other | Source: Ambulatory Visit | Attending: Radiation Oncology | Admitting: Radiation Oncology

## 2013-01-31 NOTE — Progress Notes (Signed)
  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  Narrative: The patient tolerated radiation treatment relatively well.   She did not experience any significant esophagitis during radiation. She remained  to extremely active for age continuing to perform housework and yard work. Her chest discomfort improved during radiation and her daily alignment CT images showed some tumor regression during her course of therapy.  Plan: The patient has completed radiation treatment. The patient will return to radiation oncology clinic for routine followup in one month. I advised them to call or return sooner if they have any questions or concerns related to their recovery or treatment. ________________________________  Artist Pais. Kathrynn Running, M.D.

## 2013-02-03 ENCOUNTER — Other Ambulatory Visit (HOSPITAL_BASED_OUTPATIENT_CLINIC_OR_DEPARTMENT_OTHER): Payer: Medicare Other | Admitting: Lab

## 2013-02-03 ENCOUNTER — Encounter (HOSPITAL_COMMUNITY): Payer: Self-pay

## 2013-02-03 ENCOUNTER — Ambulatory Visit (HOSPITAL_COMMUNITY)
Admission: RE | Admit: 2013-02-03 | Discharge: 2013-02-03 | Disposition: A | Discharge status: Home / Self Care | Payer: Medicare Other | Source: Ambulatory Visit | Attending: Internal Medicine | Admitting: Internal Medicine

## 2013-02-03 DIAGNOSIS — C801 Malignant (primary) neoplasm, unspecified: Secondary | ICD-10-CM

## 2013-02-03 DIAGNOSIS — R0789 Other chest pain: Secondary | ICD-10-CM | POA: Insufficient documentation

## 2013-02-03 DIAGNOSIS — C782 Secondary malignant neoplasm of pleura: Secondary | ICD-10-CM

## 2013-02-03 DIAGNOSIS — I2510 Atherosclerotic heart disease of native coronary artery without angina pectoris: Secondary | ICD-10-CM | POA: Insufficient documentation

## 2013-02-03 DIAGNOSIS — C7800 Secondary malignant neoplasm of unspecified lung: Secondary | ICD-10-CM

## 2013-02-03 DIAGNOSIS — C494 Malignant neoplasm of connective and soft tissue of abdomen: Secondary | ICD-10-CM

## 2013-02-03 DIAGNOSIS — M8448XA Pathological fracture, other site, initial encounter for fracture: Secondary | ICD-10-CM | POA: Insufficient documentation

## 2013-02-03 DIAGNOSIS — J8410 Pulmonary fibrosis, unspecified: Secondary | ICD-10-CM | POA: Insufficient documentation

## 2013-02-03 LAB — CBC WITH DIFFERENTIAL/PLATELET
Basophils Absolute: 0 10*3/uL (ref 0.0–0.1)
EOS%: 3.6 % (ref 0.0–7.0)
HGB: 12.4 g/dL (ref 11.6–15.9)
LYMPH%: 21.4 % (ref 14.0–49.7)
MCH: 29.3 pg (ref 25.1–34.0)
MCV: 88.9 fL (ref 79.5–101.0)
MONO%: 17.7 % — ABNORMAL HIGH (ref 0.0–14.0)
RDW: 15.8 % — ABNORMAL HIGH (ref 11.2–14.5)

## 2013-02-03 LAB — COMPREHENSIVE METABOLIC PANEL (CC13)
AST: 24 U/L (ref 5–34)
Albumin: 3.4 g/dL — ABNORMAL LOW (ref 3.5–5.0)
Alkaline Phosphatase: 112 U/L (ref 40–150)
BUN: 12.3 mg/dL (ref 7.0–26.0)
Creatinine: 0.9 mg/dL (ref 0.6–1.1)
Potassium: 4.7 mEq/L (ref 3.5–5.1)
Total Bilirubin: 0.3 mg/dL (ref 0.20–1.20)

## 2013-02-03 MED ORDER — IOHEXOL 300 MG/ML  SOLN
80.0000 mL | Freq: Once | INTRAMUSCULAR | Status: AC | PRN
  Administered 2013-02-03: 80 mL via INTRAVENOUS

## 2013-02-07 ENCOUNTER — Encounter: Payer: Self-pay | Admitting: Internal Medicine

## 2013-02-07 ENCOUNTER — Telehealth: Payer: Self-pay | Admitting: *Deleted

## 2013-02-07 ENCOUNTER — Telehealth: Payer: Self-pay | Admitting: Internal Medicine

## 2013-02-07 ENCOUNTER — Ambulatory Visit (HOSPITAL_BASED_OUTPATIENT_CLINIC_OR_DEPARTMENT_OTHER): Payer: Medicare Other | Admitting: Internal Medicine

## 2013-02-07 VITALS — BP 116/71 | HR 90 | Temp 97.5°F | Resp 20 | Ht 59.0 in | Wt 99.8 lb

## 2013-02-07 DIAGNOSIS — C494 Malignant neoplasm of connective and soft tissue of abdomen: Secondary | ICD-10-CM

## 2013-02-07 DIAGNOSIS — C3491 Malignant neoplasm of unspecified part of right bronchus or lung: Secondary | ICD-10-CM

## 2013-02-07 DIAGNOSIS — C7800 Secondary malignant neoplasm of unspecified lung: Secondary | ICD-10-CM

## 2013-02-07 DIAGNOSIS — C782 Secondary malignant neoplasm of pleura: Secondary | ICD-10-CM

## 2013-02-07 NOTE — Patient Instructions (Signed)
We discussed treatment options today including observation versus systemic chemotherapy with carboplatin and paclitaxel.

## 2013-02-07 NOTE — Telephone Encounter (Signed)
Gave pt appt for lab, Ml and injections, emailed Marcelino Duster regarding chemo for October 2014

## 2013-02-07 NOTE — Telephone Encounter (Signed)
Per staff message and POF I have scheduled appts.  JMW  

## 2013-02-07 NOTE — Progress Notes (Signed)
[facility] [phone]   [phone]) [phone]  OFFICE PROGRESS NOTE  [name], MD 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  2) locally advanced adenocarcinoma of the stomach status post resection in July of 2002 followed by concurrent chemoradiation under the care of Dr. Mitzi Hansen and Dr. Dalene Carrow.  PRIOR THERAPY: 1) status post wedge resection of the right upper lobe under the care of Dr. Tyrone Sage on 09/16/2011 and the final pathology was consistent with a spindle cell carcinoma. 2) status post curative radiotherapy to the recurrent disease in the right lung under the care of Dr. Kathrynn Running completed on 01/10/2013.  CURRENT THERAPY: None.  INTERVAL HISTORY: [name] 79 y.o. female returns to the clinic today for followup visit. The patient tolerated her recent course of curative radiotherapy to the recurrent disease in the right lung under the care of Dr. Kathrynn Running fairly well. She denied having any significant chest pain, shortness of breath, cough or hemoptysis. She has no nausea or vomiting, she has no weight loss or night sweats. The patient had repeat CT scan of the chest performed recently and she is here for evaluation and discussion of her scan results.  MEDICAL HISTORY: Past Medical History  Diagnosis Date  . Dyslipidemia   . Arthritis     Hx of osteoarthritis  . Shortness of breath     WITH EXERTION   . COPD (chronic obstructive pulmonary disease)   . Anemia   . History of chemotherapy     carboplatin/taxol  . Lung mass   . 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 History of radiation therapy 01/13/11-02/20/11    abdomen    ALLERGIES:  has No Known Allergies.  MEDICATIONS:  Current Outpatient Prescriptions  Medication Sig Dispense Refill  . albuterol (PROVENTIL  HFA;VENTOLIN HFA) 108 (90 BASE) MCG/ACT inhaler Inhale 3 puffs into the lungs every 4 (four) hours as needed. For shortness of breath       . calcium-vitamin D (OSCAL WITH D) 500-200 MG-UNIT per tablet Take 1 tablet by mouth every other day.       . cholecalciferol (VITAMIN D) 1000 UNITS tablet Take 1,000 Units by mouth daily.      . Cyanocobalamin (B-12) 100 MCG TABS Take 1 tablet by mouth as needed.       . Ferrous Sulfate (IRON) 325 (65 FE) MG TABS Take 1 tablet by mouth daily.       . fish oil-omega-3 fatty acids 1000 MG capsule Take 1 g by mouth daily.       . hydrOXYzine (ATARAX) 10 MG tablet Take 10 mg by mouth daily as needed. For itching.      . mirtazapine (REMERON) 15 MG tablet Take 15 mg by mouth at bedtime.      . Multiple Vitamins-Minerals (CENTRUM SILVER PO) Take 1 tablet by mouth daily.       . Multiple Vitamins-Minerals (ICAPS PO) Take 1 tablet by mouth every other day.       . Wound Cleansers (RADIAPLEX EX) Apply topically.       No current facility-administered medications for this visit.    SURGICAL HISTORY:  Past Surgical History  Procedure Laterality Date  . Esophagogastrectomy, j tube 11/08/2010    . Cholecystectomy    . Abdominal hysterectomy    .  Cholecystectomy    . Cataract extraction      RIGHT EYE   . Port-a-cath removal  05/13/2011    Procedure: REMOVAL PORT-A-CATH;  Surgeon: Almond Lint, MD;  Location: [facility] OR;  Service: General;  Laterality: N/A;  Removal port-a-cath.  . Portacath placement    . Eye surgery    . Bronchoscopy  08/28/11    rt upper lung mass bx  . Cardia resected  11/2010  . Wedge resection  09/18/11    right vats Dr. Sheliah Plane    REVIEW OF SYSTEMS:  Constitutional: negative Eyes: negative Ears, nose, mouth, throat, and face: negative Respiratory: negative Cardiovascular: negative Gastrointestinal: negative Genitourinary:negative Integument/breast: negative Hematologic/lymphatic: negative Musculoskeletal:negative Neurological:  negative Behavioral/Psych: negative Endocrine: negative Allergic/Immunologic: negative   PHYSICAL EXAMINATION: General appearance: alert, cooperative and no distress Head: Normocephalic, without obvious abnormality, atraumatic Neck: no adenopathy, no JVD, supple, symmetrical, trachea midline and thyroid not enlarged, symmetric, no tenderness/mass/nodules Lymph nodes: Cervical, supraclavicular, and axillary nodes normal. Resp: clear to auscultation bilaterally Back: symmetric, no curvature. ROM normal. No CVA tenderness. Cardio: regular rate and rhythm, S1, S2 normal, no murmur, click, rub or gallop GI: soft, non-tender; bowel sounds normal; no masses,  no organomegaly Extremities: extremities normal, atraumatic, no cyanosis or edema Neurologic: Alert and oriented X 3, normal strength and tone. Normal symmetric reflexes. Normal coordination and gait  ECOG PERFORMANCE STATUS: 1 - Symptomatic but completely ambulatory  Blood pressure 116/71, pulse 90, temperature 97.5 F (36.4 C), temperature source Oral, resp. rate 20, height 4\' 11"  (1.499 m), weight 99 lb 12.8 oz (45.269 kg).  LABORATORY DATA: Lab Results  Component Value Date   WBC 2.7* 02/03/2013   HGB 12.4 02/03/2013   HCT 37.7 02/03/2013   MCV 88.9 02/03/2013   PLT 169 02/03/2013      Chemistry      Component Value Date/Time   NA 143 02/03/2013 0830   NA 140 09/17/2011 0435   NA 143 07/23/2011 0916   K 4.7 02/03/2013 0830   K 3.6 09/17/2011 0435   K 3.8 07/23/2011 0916   CL 108 09/17/2011 0435   CL 104 07/23/2011 0916   CO2 25 02/03/2013 0830   CO2 26 09/17/2011 0435   CO2 23 07/23/2011 0916   BUN 12.3 02/03/2013 0830   BUN 5* 09/17/2011 0435   BUN 7 07/23/2011 0916   CREATININE 0.9 02/03/2013 0830   CREATININE 0.57 09/17/2011 0435   CREATININE 0.8 07/23/2011 0916      Component Value Date/Time   CALCIUM 10.4 02/03/2013 0830   CALCIUM 9.0 09/17/2011 0435   CALCIUM 8.9 07/23/2011 0916   ALKPHOS 112 02/03/2013 0830   ALKPHOS 100  09/17/2011 0435   ALKPHOS 109* 07/23/2011 0916   AST 24 02/03/2013 0830   AST 46* 09/17/2011 0435   AST 31 07/23/2011 0916   ALT 20 02/03/2013 0830   ALT 37* 09/17/2011 0435   ALT 22 07/23/2011 0916   BILITOT 0.30 02/03/2013 0830   BILITOT 0.5 09/17/2011 0435   BILITOT 0.60 07/23/2011 0916       RADIOGRAPHIC STUDIES: Ct Chest W Contrast  02/03/2013   CLINICAL DATA:  Spindle cell carcinoma diagnosed in 2013. Chemotherapy and radiation therapy. Prior wedge resection. Chest pain.  EXAM: CT CHEST WITH CONTRAST  TECHNIQUE: Multidetector CT imaging of the chest was performed during intravenous contrast administration.  CONTRAST:  80mL OMNIPAQUE IOHEXOL 300 MG/ML  SOLN  COMPARISON:  PET 10/27/2012. Chest CT 10/21/12.  FINDINGS: Lungs/Pleura: Secretions within the  trachea. Mild centrilobular emphysema. Minimal interstitial lung disease with subpleural reticulation. No definite craniocaudal predominance.  Similar scarring in the right lower lobe.  No new pulmonary parenchymal lesions.  Resolution of pleural fluid since 10/27/2012. Superior anterior right-sided pleural-based mass which measures 2.1 x 1.7 cm on image 14/ series 3 versus 2.5 x 2.6 cm on the prior exam  More anterior and inferior pleural-based lesion which measures 3.5 x 1.3 cm on image 26/ series 5 compares with 3.6 x 1.5 cm on the prior exam.  Inferior lateral pleural-based lesion which measures 3.5 x 1.7 cm on image 35/ series 5 compares with 3.9 x 1.6 cm on the prior exam.  Heart/Mediastinum: No supraclavicular adenopathy. Aortic and coronary artery atherosclerosis. Normal heart size, without pericardial effusion. No central pulmonary embolism, on this non-dedicated study. No mediastinal or hilar adenopathy. Surgical changes at the gastroesophageal junction. Question distal esophageal wall thickening on image 41/series 2.  Upper Abdomen: Old granulomatous disease in the liver. Normal but incompletely imaged adrenal glands. A 1.2 cm nodule is identified  posterior to the right kidney. New or markedly increased since 10/27/2012. Example image 51/series 2. Definitely new since 06/12/2011.  Bones/Musculoskeletal: Chest wall and osseous involvement about the lateral right 4th rib on image 25/series 2. Progressive chest wall involvement with pathologic fracture of the lateral right 5th rib on image 28/series 2.  Seventh lateral right rib destruction which is grossly similar on image image 33/series 2.  IMPRESSION: 1. Decreased size of right-sided pleural based masses. Progression of chest wall invasion and rib destruction. 2. Developing right perirenal lesion, incompletely imaged but suspicious for metastatic disease. Dedicated abdominal imaging with CT may be informative. 3. Esophageal wall thickening/esophagitis suspected. 4. Mild nonspecific interstitial lung disease. Question nonspecific interstitial pneumonitis.   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 Unfortunately the recent CT scan of the chest showed mixed response with decrease in the size of the right pleural based masses but progression of the chest wall invasion and rib destruction as well as development of right perirenal lesion suspicious for metastatic disease. I have a lengthy discussion with the patient today about her current condition. I gave her the option of continuous observation and close monitoring with repeat CT scan of the chest, abdomen and pelvis in 3 months versus consideration of systemic chemotherapy with carboplatin and paclitaxel. The patient still undecided regarding her treatment options.  She agreed to schedule chemotherapy next week and think about her options during this time. She would be treated with carboplatin  for AUC of 5 and paclitaxel 175 mg/M2 every 3 weeks with Neulasta support if she decided to proceed with the chemotherapy. I discussed with the patient adverse effect of this treatment including but not limited to alopecia, myelosuppression, nausea and vomiting, peripheral neuropathy, liver or renal dysfunction. I will arrange a followup visit for the patient based on her final decision regarding treatment. She was advised to call immediately if she has any concerning symptoms in the interval.  The patient voices understanding of current disease status and treatment options and is in agreement with the current care plan.  All questions were answered. The patient knows to call the clinic with any problems, questions or concerns. We can certainly see the patient much  sooner if necessary.  I spent 15 minutes counseling the patient face to face. The total time spent in the appointment was 25 minutes.

## 2013-02-08 ENCOUNTER — Telehealth: Payer: Self-pay | Admitting: *Deleted

## 2013-02-08 ENCOUNTER — Telehealth: Payer: Self-pay | Admitting: Internal Medicine

## 2013-02-08 NOTE — Telephone Encounter (Signed)
Pt called stating she is not ready to start chemotherapy yet.  She is requesting to cancel all appts that have been scheduled in October through November.  She states that she will call us to reschedule the appts.  She states she is still in the process of informing family so she is not ready yet.  Will cancel appts and forward msg to Dr Donnald Garre.  SLJ

## 2013-02-10 ENCOUNTER — Telehealth: Payer: Self-pay | Admitting: *Deleted

## 2013-02-10 ENCOUNTER — Ambulatory Visit
Admission: RE | Admit: 2013-02-10 | Discharge: 2013-02-10 | Disposition: A | Discharge status: Home / Self Care | Payer: Medicare Other | Source: Ambulatory Visit | Attending: Radiation Oncology | Admitting: Radiation Oncology

## 2013-02-10 ENCOUNTER — Encounter: Payer: Self-pay | Admitting: Radiation Oncology

## 2013-02-10 VITALS — BP 127/60 | HR 76 | Resp 16 | Wt 99.1 lb

## 2013-02-10 DIAGNOSIS — C801 Malignant (primary) neoplasm, unspecified: Secondary | ICD-10-CM

## 2013-02-10 NOTE — Progress Notes (Signed)
Despite imaging result patient denies painful or difficult swallowing. States, "I feel great." Reports that she remains active and has a lot of energy thus the patient doesn't understand why her cancer is spreading. Denies cough or shortness of breath. Denies headache, dizziness, nausea or vomiting. Weight stable. Denies pain.

## 2013-02-10 NOTE — Telephone Encounter (Signed)
CALLED PATIENT TO INFORM OF TEST FOR 04-12-13- ARRIVAL TIME -12:45 PM, SPOKE WITH PATIENT AND SHE IS AWARE OF THIS TEST AND FU VISIT WITH DR. MANNING ON 04-14-13- AT 8:45 AM, SPOKE WITH PATIENT AND SHE IS AWARE OF THESE APPTS.

## 2013-02-10 NOTE — Progress Notes (Signed)
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  Recent CT scan of the chest showed mixed response with decrease in the size of the right pleural based masses but progression of the chest wall invasion and rib destruction as well as development of right perirenal lesion suspicious for metastatic disease. Dr. Arbutus Ped had a lengthy discussion with the patient about her current condition. He gave her the option of continuous observation and close monitoring with repeat CT scan of the chest, abdomen and pelvis in 3 months versus consideration of systemic chemotherapy with carboplatin and paclitaxel. The patient still undecided regarding her treatment options.   She remains largely asymptomatic with an excellent performance status.                              ALLERGIES:  has No Known Allergies.  Meds: Current Outpatient Prescriptions  Medication Sig Dispense Refill  . albuterol (PROVENTIL HFA;VENTOLIN HFA) 108 (90 BASE) MCG/ACT inhaler Inhale 3 puffs into the lungs every 4 (four) hours as needed. For shortness of breath       . calcium-vitamin D (OSCAL WITH D) 500-200 MG-UNIT per tablet Take 1 tablet by mouth every other day.       . cholecalciferol (VITAMIN D) 1000 UNITS tablet Take 1,000 Units by mouth daily.      . Cyanocobalamin (B-12) 100 MCG TABS Take 1 tablet by mouth as needed.       . Ferrous Sulfate (IRON) 325 (65 FE) MG TABS Take 1 tablet by mouth daily.       . fish oil-omega-3 fatty acids 1000 MG  capsule Take 1 g by mouth daily.       . hydrOXYzine (ATARAX) 10 MG tablet Take 10 mg by mouth daily as needed. For itching.      . mirtazapine (REMERON) 15 MG tablet Take 15 mg by mouth at bedtime.      . Multiple Vitamins-Minerals (CENTRUM SILVER PO) Take 1 tablet by mouth daily.       . Multiple Vitamins-Minerals (ICAPS PO) Take 1 tablet by mouth every other day.       . Wound Cleansers (RADIAPLEX EX) Apply topically.       No current facility-administered medications for this encounter.    Physical Findings: The patient is in no acute distress. Patient is alert and oriented.  weight is 99 lb 1.6 oz (44.951 kg). Her blood pressure is 127/60 and her pulse is 76. Her respiration is 16 and oxygen saturation is 93%. .  No significant changes.  Lab Findings: Lab Results  Component Value Date   WBC 2.7* 02/03/2013   HGB 12.4 02/03/2013   HCT 37.7 02/03/2013   MCV 88.9 02/03/2013   PLT 169 02/03/2013    @LASTCHEM @  Radiographic Findings: Ct Chest W Contrast  02/03/2013   CLINICAL DATA:  Spindle cell carcinoma diagnosed in 2013. Chemotherapy and radiation therapy. Prior wedge resection. Chest pain.  EXAM: CT CHEST WITH  CONTRAST  TECHNIQUE: Multidetector CT imaging of the chest was performed during intravenous contrast administration.  CONTRAST:  80mL OMNIPAQUE IOHEXOL 300 MG/ML  SOLN  COMPARISON:  PET 10/27/2012. Chest CT 10/21/12.  FINDINGS: Lungs/Pleura: Secretions within the trachea. Mild centrilobular emphysema. Minimal interstitial lung disease with subpleural reticulation. No definite craniocaudal predominance.  Similar scarring in the right lower lobe.  No new pulmonary parenchymal lesions.  Resolution of pleural fluid since 10/27/2012. Superior anterior right-sided pleural-based mass which measures 2.1 x 1.7 cm on image 14/ series 3 versus 2.5 x 2.6 cm on the prior exam  More anterior and inferior pleural-based lesion which measures 3.5 x 1.3 cm on image 26/ series 5 compares with 3.6 x 1.5  cm on the prior exam.  Inferior lateral pleural-based lesion which measures 3.5 x 1.7 cm on image 35/ series 5 compares with 3.9 x 1.6 cm on the prior exam.  Heart/Mediastinum: No supraclavicular adenopathy. Aortic and coronary artery atherosclerosis. Normal heart size, without pericardial effusion. No central pulmonary embolism, on this non-dedicated study. No mediastinal or hilar adenopathy. Surgical changes at the gastroesophageal junction. Question distal esophageal wall thickening on image 41/series 2.  Upper Abdomen: Old granulomatous disease in the liver. Normal but incompletely imaged adrenal glands. A 1.2 cm nodule is identified posterior to the right kidney. New or markedly increased since 10/27/2012. Example image 51/series 2. Definitely new since 06/12/2011.  Bones/Musculoskeletal: Chest wall and osseous involvement about the lateral right 4th rib on image 25/series 2. Progressive chest wall involvement with pathologic fracture of the lateral right 5th rib on image 28/series 2.  Seventh lateral right rib destruction which is grossly similar on image image 33/series 2.  IMPRESSION: 1. Decreased size of right-sided pleural based masses. Progression of chest wall invasion and rib destruction. 2. Developing right perirenal lesion, incompletely imaged but suspicious for metastatic disease. Dedicated abdominal imaging with CT may be informative. 3. Esophageal wall thickening/esophagitis suspected. 4. Mild nonspecific interstitial lung disease. Question nonspecific interstitial pneumonitis.   Electronically Signed   By: Jeronimo Greaves   On: 02/03/2013 10:27   Impression:  The patient is recovering from the effects of radiation.  She has responded to treatment at the treated sites in the right upper lobe and the two pleural based lesions.  The extent of rib destruction has increased at one of the pleural based mets potentially representing progressive tumor versus radiation effects.  She has a new lesion  posteriorly along the chest wall adjacent to the right kidney potentially amenable to salvage radiation if clinically appropriate.  Plan:  The patient is still considering chemo versus surveillance.  If she elects surveillance, I would suggest repeat CT in 2 months.  _____________________________________  Artist Pais. Kathrynn Running, M.D.

## 2013-02-15 ENCOUNTER — Other Ambulatory Visit: Payer: Medicare Other | Admitting: Lab

## 2013-02-15 ENCOUNTER — Ambulatory Visit: Payer: Medicare Other

## 2013-02-16 ENCOUNTER — Ambulatory Visit: Payer: Medicare Other

## 2013-02-23 ENCOUNTER — Ambulatory Visit: Payer: Medicare Other | Admitting: Physician Assistant

## 2013-02-23 ENCOUNTER — Other Ambulatory Visit: Payer: Medicare Other | Admitting: Lab

## 2013-03-01 ENCOUNTER — Other Ambulatory Visit: Payer: Medicare Other | Admitting: Lab

## 2013-03-08 ENCOUNTER — Other Ambulatory Visit: Payer: Medicare Other | Admitting: Lab

## 2013-03-08 ENCOUNTER — Ambulatory Visit: Payer: Medicare Other

## 2013-03-09 ENCOUNTER — Ambulatory Visit: Payer: Medicare Other

## 2013-03-15 ENCOUNTER — Other Ambulatory Visit: Payer: Medicare Other | Admitting: Lab

## 2013-04-12 ENCOUNTER — Ambulatory Visit (HOSPITAL_COMMUNITY)
Admission: RE | Admit: 2013-04-12 | Discharge: 2013-04-12 | Disposition: A | Discharge status: Home / Self Care | Payer: Medicare Other | Source: Ambulatory Visit | Attending: Radiation Oncology | Admitting: Radiation Oncology

## 2013-04-12 DIAGNOSIS — C801 Malignant (primary) neoplasm, unspecified: Secondary | ICD-10-CM

## 2013-04-12 DIAGNOSIS — C169 Malignant neoplasm of stomach, unspecified: Secondary | ICD-10-CM | POA: Insufficient documentation

## 2013-04-12 DIAGNOSIS — M47814 Spondylosis without myelopathy or radiculopathy, thoracic region: Secondary | ICD-10-CM | POA: Insufficient documentation

## 2013-04-12 DIAGNOSIS — R599 Enlarged lymph nodes, unspecified: Secondary | ICD-10-CM | POA: Insufficient documentation

## 2013-04-12 DIAGNOSIS — M8448XA Pathological fracture, other site, initial encounter for fracture: Secondary | ICD-10-CM | POA: Insufficient documentation

## 2013-04-12 DIAGNOSIS — J984 Other disorders of lung: Secondary | ICD-10-CM | POA: Insufficient documentation

## 2013-04-12 DIAGNOSIS — R222 Localized swelling, mass and lump, trunk: Secondary | ICD-10-CM | POA: Insufficient documentation

## 2013-04-14 ENCOUNTER — Ambulatory Visit
Admission: RE | Admit: 2013-04-14 | Discharge: 2013-04-14 | Disposition: A | Discharge status: Home / Self Care | Payer: Medicare Other | Source: Ambulatory Visit | Attending: Radiation Oncology | Admitting: Radiation Oncology

## 2013-04-14 ENCOUNTER — Encounter: Payer: Self-pay | Admitting: Radiation Oncology

## 2013-04-14 VITALS — BP 156/70 | HR 52 | Temp 97.6°F | Resp 18 | Wt 99.0 lb

## 2013-04-14 DIAGNOSIS — C801 Malignant (primary) neoplasm, unspecified: Secondary | ICD-10-CM

## 2013-04-14 NOTE — Progress Notes (Signed)
Despite imaging result patient denies painful or difficult swallowing. States, "I feel great." Reports that she remains active and has a lot of energy. Denies cough or shortness of breath. Denies headache, dizziness, nausea or vomiting. Weight stable. Denies pain.

## 2013-04-14 NOTE — Progress Notes (Signed)
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  She remains without complaint and has an excellent performance status.                              ALLERGIES:  has No Known Allergies.  Meds: Current Outpatient Prescriptions  Medication Sig Dispense Refill  . albuterol (PROVENTIL HFA;VENTOLIN HFA) 108 (90 BASE) MCG/ACT inhaler Inhale 3 puffs into the lungs every 4 (four) hours as needed. For shortness of breath       . calcium-vitamin D (OSCAL WITH D) 500-200 MG-UNIT per tablet Take 1 tablet by mouth every other day.       . cholecalciferol (VITAMIN D) 1000 UNITS tablet Take 1,000 Units by mouth daily.      . Cyanocobalamin (B-12) 100 MCG TABS Take 1 tablet by mouth as needed.       . Ferrous Sulfate (IRON) 325 (65 FE) MG TABS Take 1 tablet by mouth daily.       . fish oil-omega-3 fatty acids 1000 MG capsule Take 1 g by mouth daily.       . hydrOXYzine (ATARAX) 10 MG tablet Take 10 mg by mouth daily as needed. For itching.      . mirtazapine (REMERON) 15 MG tablet Take 15 mg by mouth at bedtime.      . Multiple Vitamins-Minerals (CENTRUM SILVER PO) Take 1 tablet by mouth daily.       . Multiple Vitamins-Minerals (ICAPS PO) Take 1 tablet by mouth every other day.       . Wound Cleansers (RADIAPLEX EX) Apply topically.       No current facility-administered medications for this encounter.    Physical Findings: The patient is in no acute distress. Patient is alert and oriented.  weight is 99 lb  (44.906 kg). Her oral temperature is 97.6 F (36.4 C). Her blood pressure is 156/70 and her pulse is 52. Her respiration is 18 and oxygen saturation is 93%. .  No significant changes.  Lab Findings: Lab Results  Component Value Date   WBC 2.7* 02/03/2013   HGB 12.4 02/03/2013   HCT 37.7 02/03/2013   MCV 88.9 02/03/2013   PLT 169 02/03/2013    @LASTCHEM @  Radiographic Findings: Ct Chest Wo Contrast  04/12/2013   CLINICAL DATA:  Gastric cancer  EXAM: CT CHEST WITHOUT CONTRAST  TECHNIQUE: Multidetector CT imaging of the chest was performed following the standard protocol without IV contrast.  COMPARISON:  02/03/2013  FINDINGS: Right upper lobe subpleural mass measures 0.8 x 2.1 cm, image 13/series 2. Previously 1.7 x 2.1 cm. More caudal, there is a subpleural mass measuring 1 x 2.9 cm, image 25/series 2. Previously 1.3 x 3.2 cm. Subpleural mass overlying the right middle and right lower lobe measures 1.0 x 2.6 cm, image 31/ series 5. Previously 1.7 x 3.5 cm. Enlarging right apical mass measures 2.2 cm, image 6/series 2. Previously 1.5 cm. Within the deep posterior diaphragmatic  sulcus there is a mass measuring 2.5 x 1.4 cm, image 49/series 2. Previously this measured 1.4 by 1.0 cm. Again noted stress set there is been interval progression of interstitial reticulation and subpleural honeycombing within the right lung base.  The heart size appears normal. Borderline enlarged mediastinal lymph nodes are identified. There is a left paratracheal lymph node which measures 1 cm, image 23/series 2. Previously 0.8 cm. No axillary or supraclavicular adenopathy.  Limited imaging through the upper abdomen shows no acute findings. Postoperative changes involving the stomach are again noted.  Review of the visualized osseous structures shows changes of mild spondylosis. There are multiple pathologic fractures involving the lateral aspect of the right ribs.  IMPRESSION: 1. Mixed response to therapy. 2. Masses within the  right lung apex and deep, posterior costophrenic sulcus have increased in size from previous exam. The the lesions referenced on previous exam have decreased in size in the interval. 3. Multiple right lateral rib fractures are likely pathologic.   Electronically Signed   By: Signa Kell M.D.   On: 04/12/2013 15:45    Impression:  The patient has control of 3 previously treated pleural/chest wall masses, but, isolated progression in 2 untreated areas at the apex and diaphragm on the right.  Plan:  At this point, we could consider SBRT for the progressive 2 oligometastatic deposits given her excellent performance status and good response to previous radiation.  Today, I talked to the patient about the findings and work-up thus far.  We discussed the natural history of her disease and general treatment, highlighting the role or SBRT in the management of oligometastases.  We discussed the available radiation techniques, and focused on the details of logistics and delivery.  We reviewed the anticipated acute and late sequelae associated with radiation in this setting.  The patient was encouraged to ask questions that I answered to the best of my ability.  I filled out a patient counseling form during our discussion including treatment diagrams.  We retained a copy for our records.  The patient would like to proceed with radiation and will be scheduled for CT simulation on 05/13/13.  I spent 20 minutes minutes face to face with the patient and more than 50% of that time was spent in counseling and/or coordination of care.   _____________________________________  Artist Pais. Kathrynn Running, M.D.

## 2013-04-15 ENCOUNTER — Other Ambulatory Visit: Payer: Self-pay | Admitting: Radiation Oncology

## 2013-05-13 ENCOUNTER — Ambulatory Visit
Admission: RE | Admit: 2013-05-13 | Discharge: 2013-05-13 | Disposition: A | Discharge status: Home / Self Care | Payer: Medicare Other | Source: Ambulatory Visit | Attending: Radiation Oncology | Admitting: Radiation Oncology

## 2013-05-13 DIAGNOSIS — C801 Malignant (primary) neoplasm, unspecified: Secondary | ICD-10-CM

## 2013-05-13 DIAGNOSIS — Z510 Encounter for antineoplastic radiation therapy: Secondary | ICD-10-CM | POA: Insufficient documentation

## 2013-05-13 DIAGNOSIS — C3490 Malignant neoplasm of unspecified part of unspecified bronchus or lung: Secondary | ICD-10-CM | POA: Insufficient documentation

## 2013-05-13 NOTE — Progress Notes (Signed)
  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  Identity was confirmed.  All relevant records and images related to the planned course of therapy were reviewed.  The patient freely provided informed written consent to proceed with treatment after reviewing the details related to the planned course of therapy. The consent form was witnessed and verified by the simulation staff.  Then, the patient was set-up in a stable reproducible  supine position for radiation therapy.  A BodyFix immobilization pillow was fabricated for reproducible positioning.  Then I personally applied the abdominal compression paddle to limit respiratory excursion.  4D respiratoy motion management CT images were obtained.  Surface markings were placed.  The CT images were loaded into the planning software.  Then, using Cine, MIP, and standard views, the internal target volume (ITV) and planning target volumes (PTV) were delinieated, and avoidance structures were contoured.  Treatment planning then occurred.  The radiation prescription was entered and confirmed.  A total of two complex treatment devices were fabricated in the form of the BodyFix immobilization pillow and a neck accuform cushion.  I have requested : 3D Simulation  I have requested a DVH of the following structures: Heart, Lungs, Esophagus, Chest Wall, Brachial Plexus, Major Blood Vessels, and targets.  PLAN:  The patient will receive 50 Gy in 5 fractions.  ________________________________  Sheral Apley Tammi Klippel, M.D.

## 2013-05-23 ENCOUNTER — Encounter: Payer: Self-pay | Admitting: Radiation Oncology

## 2013-05-25 ENCOUNTER — Ambulatory Visit
Admission: RE | Admit: 2013-05-25 | Discharge: 2013-05-25 | Disposition: A | Discharge status: Home / Self Care | Payer: Medicare Other | Source: Ambulatory Visit | Attending: Radiation Oncology | Admitting: Radiation Oncology

## 2013-05-25 ENCOUNTER — Encounter: Payer: Self-pay | Admitting: Radiation Oncology

## 2013-05-25 DIAGNOSIS — C801 Malignant (primary) neoplasm, unspecified: Secondary | ICD-10-CM

## 2013-05-25 NOTE — Progress Notes (Signed)
  Radiation Oncology         (336) [phone] ________________________________  Name: [name] MRN: [id number]  Date: 05/25/2013  DOB: May 20, 1930  Stereotactic Body Radiotherapy Treatment Procedure Note  NARRATIVE:  [name] was brought to the stereotactic radiation treatment machine and placed supine on the CT couch. The patient was set up for stereotactic body radiotherapy on the body fix pillow.  3D TREATMENT PLANNING AND DOSIMETRY:  The patient's radiation plan was reviewed and approved prior to starting treatment.  It showed 3-dimensional radiation distributions overlaid onto the planning CT.  The [facility] for the target structures as well as the organs at risk were reviewed. The documentation of this is filed in the radiation oncology EMR.  SIMULATION VERIFICATION:  The patient underwent CT imaging on the treatment unit.  These were carefully aligned to document that the ablative radiation dose would cover the target volume and maximally spare the nearby organs at risk according to the planned distribution.  SPECIAL TREATMENT PROCEDURE: [name] received high dose ablative stereotactic body radiotherapy to the planned target volume without unforeseen complications. Treatment was delivered uneventfully. The high doses associated with stereotactic body radiotherapy and the significant potential risks require careful treatment set up and patient monitoring constituting a special treatment procedure   STEREOTACTIC TREATMENT MANAGEMENT:  Following delivery, the patient was evaluated clinically. The patient tolerated treatment without significant acute effects, and was discharged to home in stable condition.    PLAN: Continue treatment as planned.  ________________________________  Sheral Apley. Tammi Klippel, M.D.

## 2013-05-27 ENCOUNTER — Encounter: Payer: Self-pay | Admitting: Radiation Oncology

## 2013-05-27 ENCOUNTER — Ambulatory Visit
Admission: RE | Admit: 2013-05-27 | Discharge: 2013-05-27 | Disposition: A | Discharge status: Home / Self Care | Payer: Medicare Other | Source: Ambulatory Visit | Attending: Radiation Oncology | Admitting: Radiation Oncology

## 2013-05-27 VITALS — BP 152/87 | HR 89 | Resp 18 | Wt 95.6 lb

## 2013-05-27 DIAGNOSIS — C801 Malignant (primary) neoplasm, unspecified: Secondary | ICD-10-CM

## 2013-05-27 NOTE — Progress Notes (Signed)
Patient's only complaint today is increased shortness of breath. Pulse ox 91% on room air. Denies difficulty swallowing or cough. Denies skin changes within treatment field. Denies fatigue. Explains she has been busy today cleaning out her closet. Patient reports her skin feel dry and itchy all over. Provided patient with aquaphor samples to use on skin at night before bed. Requested she avoiding put products on her skin four hours prior to treatment within treatment field. Patient has gas heat in her home and I suspect this is a contributing factor to her dry skin.

## 2013-05-27 NOTE — Progress Notes (Signed)
  Radiation Oncology         [phone] ________________________________  Name: [name] MRN: [id number]  Date: 05/27/2013  DOB: 24-Jan-1931  Weekly Radiation Therapy Management  Current Dose: 20 Gy     Planned Dose:  50 Gy  Narrative . . . . . . . . The patient presents for routine under treatment assessment.                                   The patient is without complaint.                                 Set-up films were reviewed.                                 The chart was checked. Physical Findings. . .  weight is 95 lb 9.6 oz (43.364 kg). Her blood pressure is 152/87 and her pulse is 89. Her respiration is 18 and oxygen saturation is 91%. . Weight essentially stable.  No significant changes. Impression . . . . . . . The patient is tolerating radiation. Plan . . . . . . . . . . . . Continue treatment as planned.  ________________________________  Sheral Apley. Tammi Klippel, M.D.

## 2013-05-27 NOTE — Progress Notes (Signed)
  Radiation Oncology         (336) [phone] ________________________________  Name: [name] MRN: [id number]  Date: 05/27/2013  DOB: 05-Apr-1931  Stereotactic Body Radiotherapy Treatment Procedure Note  CURRENT FRACTION:    2  PLANNED FRACTIONS:  5  NARRATIVE:  [name] was brought to the stereotactic radiation treatment machine and placed supine on the CT couch. The patient was set up for stereotactic body radiotherapy on the body fix pillow.  3D TREATMENT PLANNING AND DOSIMETRY:  The patient's radiation plan was reviewed and approved prior to starting treatment.  It showed 3-dimensional radiation distributions overlaid onto the planning CT.  [facility] for the target structures as well as the organs at risk were reviewed. The documentation of this is filed in the radiation oncology EMR.  SIMULATION VERIFICATION:  The patient underwent CT imaging on the treatment unit.  These were carefully aligned to document that the ablative radiation dose would cover the target volume and maximally spare the nearby organs at risk according to the planned distribution.  SPECIAL TREATMENT PROCEDURE: [name] received high dose ablative stereotactic body radiotherapy to the planned target volume without unforeseen complications. Treatment was delivered uneventfully. The high doses associated with stereotactic body radiotherapy and the significant potential risks require careful treatment set up and patient monitoring constituting a special treatment procedure   STEREOTACTIC TREATMENT MANAGEMENT:  Following delivery, the patient was evaluated clinically. The patient tolerated treatment without significant acute effects, and was discharged to home in stable condition.    PLAN: Continue treatment as planned.  ________________________________  Sheral Apley. Tammi Klippel, M.D.

## 2013-05-30 ENCOUNTER — Ambulatory Visit
Admission: RE | Admit: 2013-05-30 | Discharge: 2013-05-30 | Disposition: A | Discharge status: Home / Self Care | Payer: Medicare Other | Source: Ambulatory Visit | Attending: Radiation Oncology | Admitting: Radiation Oncology

## 2013-05-30 DIAGNOSIS — C160 Malignant neoplasm of cardia: Secondary | ICD-10-CM

## 2013-05-30 DIAGNOSIS — C801 Malignant (primary) neoplasm, unspecified: Secondary | ICD-10-CM

## 2013-05-31 NOTE — Progress Notes (Signed)
  Radiation Oncology [phone]  ________________________________  Name: [name] MRN: [id number]  Date: 05/27/2013 DOB: 07-06-1930  Stereotactic Body Radiotherapy Treatment Procedure Note  CURRENT FRACTION: 3 PLANNED FRACTIONS: 5  NARRATIVE: [name] was brought to the stereotactic radiation treatment machine and placed supine on the CT couch. The patient was set up for stereotactic body radiotherapy on the body fix pillow.  3D TREATMENT PLANNING AND DOSIMETRY: The patient's radiation plan was reviewed and approved prior to starting treatment. It showed 3-dimensional radiation distributions overlaid onto the planning CT. The [facility] for the target structures as well as the organs at risk were reviewed. The documentation of this is filed in the radiation oncology EMR.  SIMULATION VERIFICATION: The patient underwent CT imaging on the treatment unit. These were carefully aligned to document that the ablative radiation dose would cover the target volume and maximally spare the nearby organs at risk according to the planned distribution.  SPECIAL TREATMENT PROCEDURE: [name] received high dose ablative stereotactic body radiotherapy to the planned target volume without unforeseen complications. Treatment was delivered uneventfully. The high doses associated with stereotactic body radiotherapy and the significant potential risks require careful treatment set up and patient monitoring constituting a special treatment procedure  STEREOTACTIC TREATMENT MANAGEMENT: Following delivery, the patient was evaluated clinically. The patient tolerated treatment without significant acute effects, and was discharged to home in stable condition.  PLAN: Continue treatment as planned.

## 2013-06-01 ENCOUNTER — Ambulatory Visit
Admission: RE | Admit: 2013-06-01 | Discharge: 2013-06-01 | Disposition: A | Discharge status: Home / Self Care | Payer: Medicare Other | Source: Ambulatory Visit | Attending: Radiation Oncology | Admitting: Radiation Oncology

## 2013-06-01 DIAGNOSIS — C801 Malignant (primary) neoplasm, unspecified: Secondary | ICD-10-CM

## 2013-06-01 NOTE — Progress Notes (Signed)
  Radiation Oncology         [phone] ________________________________  Name: [name] MRN: [id number]  Date: 06/01/2013  DOB: 03-24-1931  Stereotactic Body Radiotherapy Treatment Procedure Note  CURRENT FRACTION:    4  PLANNED FRACTIONS:  5  NARRATIVE:  [name] was brought to the stereotactic radiation treatment machine and placed supine on the CT couch. The patient was set up for stereotactic body radiotherapy on the body fix pillow.  3D TREATMENT PLANNING AND DOSIMETRY:  The patient's radiation plan was reviewed and approved prior to starting treatment.  It showed 3-dimensional radiation distributions overlaid onto the planning CT.  The [facility] for the target structures as well as the organs at risk were reviewed. The documentation of this is filed in the radiation oncology EMR.  SIMULATION VERIFICATION:  The patient underwent CT imaging on the treatment unit.  These were carefully aligned to document that the ablative radiation dose would cover the target volume and maximally spare the nearby organs at risk according to the planned distribution.  SPECIAL TREATMENT PROCEDURE: [name] received high dose ablative stereotactic body radiotherapy to the planned target volume without unforeseen complications. Treatment was delivered uneventfully. The high doses associated with stereotactic body radiotherapy and the significant potential risks require careful treatment set up and patient monitoring constituting a special treatment procedure   STEREOTACTIC TREATMENT MANAGEMENT:  Following delivery, the patient was evaluated clinically. The patient tolerated treatment without significant acute effects, and was discharged to home in stable condition.    PLAN: Continue treatment as planned.  ________________________________  Sheral Apley. Tammi Klippel, M.D.

## 2013-06-03 ENCOUNTER — Ambulatory Visit
Admission: RE | Admit: 2013-06-03 | Discharge: 2013-06-03 | Disposition: A | Discharge status: Home / Self Care | Payer: Medicare Other | Source: Ambulatory Visit | Attending: Radiation Oncology | Admitting: Radiation Oncology

## 2013-06-03 ENCOUNTER — Encounter: Payer: Self-pay | Admitting: Radiation Oncology

## 2013-06-03 VITALS — BP 140/87 | HR 87 | Resp 16 | Wt 92.6 lb

## 2013-06-03 DIAGNOSIS — C801 Malignant (primary) neoplasm, unspecified: Secondary | ICD-10-CM

## 2013-06-03 DIAGNOSIS — C160 Malignant neoplasm of cardia: Secondary | ICD-10-CM

## 2013-06-03 NOTE — Progress Notes (Signed)
  Radiation Oncology         [phone] ________________________________  Name: [name] MRN: [id number]  Date: 06/03/2013  DOB: 07/18/1930  Weekly Radiation Therapy Management  Current Dose: 50 Gy     Planned Dose:  50 Gy  Narrative . . . . . . . . The patient presents for routine under treatment assessment.                                   The patient has some increase in dyspnea with exertion.                                 Set-up films were reviewed.                                 The chart was checked. Physical Findings. . .  weight is 92 lb 9.6 oz (42.003 kg). Her blood pressure is 140/87 and her pulse is 87. Her respiration is 16 and oxygen saturation is 95%. . Weight essentially stable.  No significant changes. Impression . . . . . . . The patient is tolerating radiation. Plan . . . . . . . . . . . . Continue treatment as planned.  ________________________________  Sheral Apley. Tammi Klippel, M.D.

## 2013-06-03 NOTE — Progress Notes (Signed)
Reports decreased energy level. Denies skin changes within treatment field. Provided patient with additional tube of radiaplex. Reports increased frequency of cough. Reports cough is productive with clear thick sputum. Denies difficulty swallowing. Reports mild shortness of breath. Provided patient with one month follow up appointment card.

## 2013-06-05 NOTE — Progress Notes (Signed)
  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  The lesion in her right apical lung was treated to 50 gray in 5 fractions of 10 gray 2.  The lesion in her right lower lung at the diaphragmatic insertion received 50 gray in 5 fractions of 10 gray  Beams/energy:    The patient was set up and immobilized using a body fix custom pillow. Image guidance was performed with cone beam CT in robotic couch adjustment prior to treatment at each of the 2 isocenters. 1.  The lesion in her right apical lung was treated using 3-D conformal optimized volumetric ARC therapy with 2 rotational arcs delivering 6 megavolt x-rays 2.  The lesion in her right lower lung at the diaphragmatic insertion was treated using 3-D conformal optimized volumetric ARC therapy with 2 rotational arcs delivering 6 megavolt x-rays  Narrative: The patient tolerated radiation treatment relatively well.   She did experience some fatigue during radiation.   Plan: The patient has completed radiation treatment. The patient will return to radiation oncology clinic for routine followup in one month. I advised them to call or return sooner if they have any questions or concerns related to their recovery or treatment. ________________________________  Sheral Apley. Tammi Klippel, M.D.

## 2013-06-05 NOTE — Progress Notes (Signed)
  Radiation Oncology         [phone]) [phone] ________________________________  Name: [name] MRN: [id number]  Date: 06/03/2013  DOB: Dec 21, 1930  Stereotactic Body Radiotherapy Treatment Procedure Note  CURRENT FRACTION:    5  PLANNED FRACTIONS:  5  NARRATIVE:  [name] was brought to the stereotactic radiation treatment machine and placed supine on the CT couch. The patient was set up for stereotactic body radiotherapy on the body fix pillow.  3D TREATMENT PLANNING AND DOSIMETRY:  The patient's radiation plan was reviewed and approved prior to starting treatment.  It showed 3-dimensional radiation distributions overlaid onto the planning CT.  [facility] for the target structures as well as the organs at risk were reviewed. The documentation of this is filed in the radiation oncology EMR.  SIMULATION VERIFICATION:  The patient underwent CT imaging on the treatment unit.  These were carefully aligned to document that the ablative radiation dose would cover the target volume and maximally spare the nearby organs at risk according to the planned distribution.  SPECIAL TREATMENT PROCEDURE: [name] received high dose ablative stereotactic body radiotherapy to the planned target volume without unforeseen complications. Treatment was delivered uneventfully. The high doses associated with stereotactic body radiotherapy and the significant potential risks require careful treatment set up and patient monitoring constituting a special treatment procedure   STEREOTACTIC TREATMENT MANAGEMENT:  Following delivery, the patient was evaluated clinically. The patient tolerated treatment without significant acute effects, and was discharged to home in stable condition.    PLAN: Complete treatment as planned.  ________________________________  Sheral Apley. Tammi Klippel, M.D.

## 2013-06-06 MED ORDER — RADIAPLEXRX EX GEL
Freq: Once | CUTANEOUS | Status: AC
  Administered 2013-06-06: 09:00:00 via TOPICAL

## 2013-06-06 NOTE — Addendum Note (Signed)
Encounter addended by: Heywood Footman, RN on: 06/06/2013  9:00 AM<BR>     Documentation filed: Inpatient MAR, Orders

## 2013-06-28 NOTE — Progress Notes (Signed)
  Radiation Oncology         [phone] ________________________________  Name: ANNALUCIA LAINO MRN: [id number]  Date: 05/23/2013  DOB: 1930-12-24  RESPIRATORY MOTION MANAGEMENT SIMULATION  NARRATIVE:  In order to account for effect of respiratory motion on target structures and other organs in the planning and delivery of radiotherapy, this patient underwent respiratory motion management simulation.  To accomplish this, when the patient was brought to the CT simulation planning suite, 4D respiratoy motion management CT images were obtained.  The CT images were loaded into the planning software.  Then, using a variety of tools including Cine, MIP, and standard views, the target volume and planning target volumes (PTV) were delineated.  Avoidance structures were contoured.  Treatment planning then occurred.  Dose volume histograms were generated and reviewed for each of the requested structure.  The resulting plan was carefully reviewed and approved today.  ------------------------------------------------  Sheral Apley. Tammi Klippel, M.D.

## 2013-06-30 ENCOUNTER — Ambulatory Visit: Payer: Medicare Other | Admitting: Radiation Oncology

## 2013-07-21 ENCOUNTER — Ambulatory Visit
Admission: RE | Admit: 2013-07-21 | Discharge: 2013-07-21 | Disposition: A | Discharge status: Home / Self Care | Payer: Medicare Other | Source: Ambulatory Visit | Attending: Radiation Oncology | Admitting: Radiation Oncology

## 2013-07-21 ENCOUNTER — Encounter: Payer: Self-pay | Admitting: Radiation Oncology

## 2013-07-21 DIAGNOSIS — C801 Malignant (primary) neoplasm, unspecified: Secondary | ICD-10-CM

## 2013-07-21 NOTE — Progress Notes (Addendum)
  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  Patient for routine follow up completion of radiation to right lung.Denies pain.Has shortness of breath on exertion only.Denies skin changes from radiation.Has rash on posterior upper leg which is clearing.taking a medication for this but doesn't know name of med.Oxygen saturation 90 % on room air She has some coughing occasionally productive.                             ALLERGIES:  has No Known Allergies.  Meds: Current Outpatient Prescriptions  Medication Sig Dispense Refill  . albuterol (PROVENTIL HFA;VENTOLIN HFA) 108 (90 BASE) MCG/ACT inhaler Inhale 3 puffs into the lungs every 4 (four) hours as needed. For shortness of breath       . calcium-vitamin D (OSCAL WITH D) 500-200 MG-UNIT per tablet Take 1 tablet by mouth every other day.       . cholecalciferol (VITAMIN D) 1000 UNITS tablet Take 1,000 Units by mouth daily.      . Cyanocobalamin (B-12) 100 MCG TABS Take 1 tablet by mouth as needed.       . Ferrous Sulfate (IRON) 325 (65 FE) MG TABS Take 1 tablet by mouth daily.       . fish oil-omega-3 fatty acids 1000 MG capsule Take 1 g by mouth daily.       . hydrOXYzine (ATARAX) 10 MG tablet Take 10 mg by mouth daily as needed. For itching.      . mirtazapine (REMERON) 15 MG tablet Take 15 mg by mouth at bedtime.      . Multiple Vitamins-Minerals (CENTRUM SILVER PO) Take 1 tablet by mouth daily.       . Multiple Vitamins-Minerals (ICAPS PO) Take 1 tablet by mouth every other day.       . Wound Cleansers (RADIAPLEX EX) Apply topically.       No current  facility-administered medications for this encounter.    Physical Findings: The patient is in no acute distress. Patient is alert and oriented.  No significant changes.  Lab Findings: Lab Results  Component Value Date   WBC 2.7* 02/03/2013   HGB 12.4 02/03/2013   HCT 37.7 02/03/2013   MCV 88.9 02/03/2013   PLT 169 02/03/2013     Impression:  The patient is recovering from the effects of radiation.   Plan:  Recommended Robitussin DM for cough.  CT chest now and then follow-up.   _____________________________________  Sheral Apley. Tammi Klippel, M.D.

## 2013-07-21 NOTE — Progress Notes (Signed)
Patient for routine follow up completion of radiation to  right lung.Denies pain.Has shortness of breath on exertion only.Denies skin changes from radiation.Has rash on posterior upper leg which is clearing.taking a medication for this but doesn't know name of med.Oxygen saturation 90 % on room air.

## 2013-07-22 ENCOUNTER — Telehealth: Payer: Self-pay | Admitting: *Deleted

## 2013-07-22 ENCOUNTER — Encounter: Payer: Self-pay | Admitting: Radiation Oncology

## 2013-07-22 NOTE — Telephone Encounter (Signed)
CALLED PATIENT TO INFORM OF TEST , LVM FOR A RETURN CALL

## 2013-08-15 ENCOUNTER — Ambulatory Visit (HOSPITAL_COMMUNITY)
Admission: RE | Admit: 2013-08-15 | Discharge: 2013-08-15 | Disposition: A | Discharge status: Home / Self Care | Payer: Medicare Other | Source: Ambulatory Visit | Attending: Radiation Oncology | Admitting: Radiation Oncology

## 2013-08-15 ENCOUNTER — Ambulatory Visit (HOSPITAL_COMMUNITY): Admission: RE | Admit: 2013-08-15 | Payer: Medicare Other | Source: Ambulatory Visit

## 2013-08-15 DIAGNOSIS — J438 Other emphysema: Secondary | ICD-10-CM | POA: Insufficient documentation

## 2013-08-15 DIAGNOSIS — C3490 Malignant neoplasm of unspecified part of unspecified bronchus or lung: Secondary | ICD-10-CM | POA: Insufficient documentation

## 2013-08-15 DIAGNOSIS — C801 Malignant (primary) neoplasm, unspecified: Secondary | ICD-10-CM

## 2013-08-15 DIAGNOSIS — C169 Malignant neoplasm of stomach, unspecified: Secondary | ICD-10-CM | POA: Insufficient documentation

## 2013-08-15 DIAGNOSIS — J8410 Pulmonary fibrosis, unspecified: Secondary | ICD-10-CM | POA: Insufficient documentation

## 2013-08-15 DIAGNOSIS — J984 Other disorders of lung: Secondary | ICD-10-CM | POA: Insufficient documentation

## 2013-08-15 DIAGNOSIS — I2510 Atherosclerotic heart disease of native coronary artery without angina pectoris: Secondary | ICD-10-CM | POA: Insufficient documentation

## 2013-08-15 DIAGNOSIS — Z923 Personal history of irradiation: Secondary | ICD-10-CM | POA: Insufficient documentation

## 2013-08-15 DIAGNOSIS — Z9221 Personal history of antineoplastic chemotherapy: Secondary | ICD-10-CM | POA: Insufficient documentation

## 2013-08-16 ENCOUNTER — Telehealth: Payer: Self-pay | Admitting: Radiation Oncology

## 2013-08-16 NOTE — Telephone Encounter (Signed)
Message copied by Heywood Footman on Tue Aug 16, 2013 11:02 AM ------      Message from: [place], [place]      Created: Mon Aug 15, 2013  7:23 PM       Please call this patient. Please let her know that her treated sites have improved on chest CT. She has followup scheduled with Dr. Earlie Server on April 20. I will be happy to see her also if she has any additional questions or concerns related to radiation. ------

## 2013-08-16 NOTE — Telephone Encounter (Signed)
As directed by Dr. Tammi Klippel phoned patient's cell. Niece, Hinton Dyer, answered. Explained that the treated sites have improved with radiation as evident by recent chest CT. Reminded her of her aunt's appointment with Dr. Earlie Server on April 20th. Explained there is no follow up scheduled with Dr. Tammi Klippel at this time but, we would be glad to see her with any future needs. She verbalized understanding and expressed appreciation for the call.

## 2013-08-22 ENCOUNTER — Encounter: Payer: Self-pay | Admitting: Internal Medicine

## 2013-08-22 ENCOUNTER — Ambulatory Visit (HOSPITAL_BASED_OUTPATIENT_CLINIC_OR_DEPARTMENT_OTHER): Payer: Medicare Other | Admitting: Internal Medicine

## 2013-08-22 ENCOUNTER — Telehealth: Payer: Self-pay | Admitting: Internal Medicine

## 2013-08-22 VITALS — BP 142/91 | HR 91 | Temp 97.8°F | Resp 20 | Ht 59.0 in | Wt 92.3 lb

## 2013-08-22 DIAGNOSIS — C3410 Malignant neoplasm of upper lobe, unspecified bronchus or lung: Secondary | ICD-10-CM

## 2013-08-22 DIAGNOSIS — C782 Secondary malignant neoplasm of pleura: Secondary | ICD-10-CM

## 2013-08-22 DIAGNOSIS — C801 Malignant (primary) neoplasm, unspecified: Secondary | ICD-10-CM

## 2013-08-22 NOTE — Progress Notes (Signed)
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  2) locally advanced adenocarcinoma of the stomach status post resection in July of 2002 followed by concurrent chemoradiation under the care of Dr. Lisbeth Renshaw and Dr. Jamse Arn.  PRIOR THERAPY: 1) status post wedge resection of the right upper lobe under the care of Dr. Servando Snare on 09/16/2011 and the final pathology was consistent with a spindle cell carcinoma. 2) status post curative radiotherapy to the recurrent disease in the right lung under the care of Dr. Tammi Klippel completed on 01/10/2013. 3) status post stereotactic radiotherapy to the metastatic deposits in the lung completed 06/03/2013 under the care of Dr. Tammi Klippel.  CURRENT THERAPY: Observation.  INTERVAL HISTORY: [name] 80 y.o. female returns to the clinic today for followup visit. She was recently treated with a course of stereotactic radiotherapy to the progressive lung mass under the care of Dr. Tammi Klippel recently and tolerated it fairly well. She denied having any significant chest pain, shortness of breath, cough or hemoptysis. She has no nausea or vomiting, she has no weight loss or night sweats. The patient had repeat CT scan of the chest performed recently and she is here for evaluation and discussion of her scan results.  MEDICAL HISTORY: Past Medical History  Diagnosis Date  . Dyslipidemia   . Arthritis     Hx of osteoarthritis  . Shortness of breath     WITH EXERTION   . COPD (chronic obstructive pulmonary disease)   . Anemia   . History of chemotherapy     carboplatin/taxol  . Lung mass   . 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 History of radiation therapy 01/13/11-02/20/11   abdomen    ALLERGIES:  has No Known Allergies.  MEDICATIONS:  Current Outpatient Prescriptions  Medication Sig Dispense Refill  . albuterol (PROVENTIL HFA;VENTOLIN HFA) 108 (90 BASE) MCG/ACT inhaler Inhale 3 puffs into the lungs every 4 (four) hours as needed. For shortness of breath       . calcium-vitamin D (OSCAL WITH D) 500-200 MG-UNIT per tablet Take 1 tablet by mouth every other day.       . cholecalciferol (VITAMIN D) 1000 UNITS tablet Take 1,000 Units by mouth daily.      . Cyanocobalamin (B-12) 100 MCG TABS Take 1 tablet by mouth as needed.       . Ferrous Sulfate (IRON) 325 (65 FE) MG TABS Take 1 tablet by mouth daily.       . fish oil-omega-3 fatty acids 1000 MG capsule Take 1 g by mouth daily.       . hydrOXYzine (ATARAX) 10 MG tablet Take 10 mg by mouth daily as needed. For itching.      . mirtazapine (REMERON) 15 MG tablet Take 15 mg by mouth at bedtime.      . Multiple Vitamins-Minerals (CENTRUM SILVER PO) Take 1 tablet by mouth daily.       . Multiple Vitamins-Minerals (ICAPS PO) Take 1 tablet by mouth every other day.       . Wound Cleansers (RADIAPLEX EX) Apply topically.       No current facility-administered medications for this visit.    SURGICAL HISTORY:  Past Surgical History  Procedure Laterality Date  .  Esophagogastrectomy, j tube 11/08/2010    . Cholecystectomy    . Abdominal hysterectomy    . Cholecystectomy    . Cataract extraction      RIGHT EYE   . Port-a-cath removal  05/13/2011    Procedure: REMOVAL PORT-A-CATH;  Surgeon: Stark Klein, MD;  Location: [facility];  Service: General;  Laterality: N/A;  Removal port-a-cath.  . Portacath placement    . Eye surgery    . Bronchoscopy  08/28/11    rt upper lung mass bx  . Cardia resected  11/2010  . Wedge resection  09/18/11    right vats Dr. Lanelle Bal    REVIEW OF SYSTEMS:  Constitutional: negative Eyes: negative Ears, nose, mouth, throat, and face: negative Respiratory: negative Cardiovascular:  negative Gastrointestinal: negative Genitourinary:negative Integument/breast: negative Hematologic/lymphatic: negative Musculoskeletal:negative Neurological: negative Behavioral/Psych: negative Endocrine: negative Allergic/Immunologic: negative   PHYSICAL EXAMINATION: General appearance: alert, cooperative and no distress Head: Normocephalic, without obvious abnormality, atraumatic Neck: no adenopathy, no JVD, supple, symmetrical, trachea midline and thyroid not enlarged, symmetric, no tenderness/mass/nodules Lymph nodes: Cervical, supraclavicular, and axillary nodes normal. Resp: clear to auscultation bilaterally Back: symmetric, no curvature. ROM normal. No CVA tenderness. Cardio: regular rate and rhythm, S1, S2 normal, no murmur, click, rub or gallop GI: soft, non-tender; bowel sounds normal; no masses,  no organomegaly Extremities: extremities normal, atraumatic, no cyanosis or edema Neurologic: Alert and oriented X 3, normal strength and tone. Normal symmetric reflexes. Normal coordination and gait  ECOG PERFORMANCE STATUS: 1 - Symptomatic but completely ambulatory  Blood pressure 142/91, pulse 91, temperature 97.8 F (36.6 C), temperature source Oral, resp. rate 20, height 4\' 11"  (1.499 m), weight 92 lb 4.8 oz (41.867 kg).  LABORATORY DATA: Lab Results  Component Value Date   WBC 2.7* 02/03/2013   HGB 12.4 02/03/2013   HCT 37.7 02/03/2013   MCV 88.9 02/03/2013   PLT 169 02/03/2013      Chemistry      Component Value Date/Time   NA 143 02/03/2013 0830   NA 140 09/17/2011 0435   NA 143 07/23/2011 0916   K 4.7 02/03/2013 0830   K 3.6 09/17/2011 0435   K 3.8 07/23/2011 0916   CL 108 09/17/2011 0435   CL 104 07/23/2011 0916   CO2 25 02/03/2013 0830   CO2 26 09/17/2011 0435   CO2 23 07/23/2011 0916   BUN 12.3 02/03/2013 0830   BUN 5* 09/17/2011 0435   BUN 7 07/23/2011 0916   CREATININE 0.9 02/03/2013 0830   CREATININE 0.57 09/17/2011 0435   CREATININE 0.8 07/23/2011 0916        Component Value Date/Time   CALCIUM 10.4 02/03/2013 0830   CALCIUM 9.0 09/17/2011 0435   CALCIUM 8.9 07/23/2011 0916   ALKPHOS 112 02/03/2013 0830   ALKPHOS 100 09/17/2011 0435   ALKPHOS 109* 07/23/2011 0916   AST 24 02/03/2013 0830   AST 46* 09/17/2011 0435   AST 31 07/23/2011 0916   ALT 20 02/03/2013 0830   ALT 37* 09/17/2011 0435   ALT 22 07/23/2011 0916   BILITOT 0.30 02/03/2013 0830   BILITOT 0.5 09/17/2011 0435   BILITOT 0.60 07/23/2011 0916       RADIOGRAPHIC STUDIES: Ct Chest Wo Contrast  08/15/2013   CLINICAL DATA:  Gastric cancer, lung cancer. Chemotherapy and radiation therapy complete.  EXAM: CT CHEST WITHOUT CONTRAST  TECHNIQUE: Multidetector CT imaging of the chest was performed following the standard protocol without IV contrast.  COMPARISON:  CT CHEST W/O CM  dated 04/12/2013  FINDINGS: Mediastinal lymph nodes measure up to 11 mm in the lower right paratracheal station, stable. Hilar regions are difficult to definitively evaluate without IV contrast. No axillary adenopathy. Atherosclerotic calcification of the arterial vasculature, including three-vessel involvement of the coronary arteries. Heart size normal. No pericardial effusion.  Subpleural consolidation in the apical segment right upper lobe measures approximately 1.5 x 2.1 cm (previously 1.7 x 2.4 cm). Additional areas of focal pleural thickening in the right hemi thorax (series 6, images 10, 11, 19, 29 and 45) have improved in the interval as well. Emphysema. Postoperative changes in the medial aspect of the upper right hemi thorax. Centrilobular and paraseptal emphysema. Mild superimposed subpleural reticulation. Opacity along the lateral aspect of the right major fissure measures roughly 0.9 x 2.8 cm (image 26), stable. Bronchiectasis, coarsening and volume loss in the right lower lobe, as before. No pleural fluid. Airway is unremarkable.  Incidental imaging of the upper abdomen shows a visualized portions of the liver, adrenal  glands, kidneys, spleen and pancreas to be grossly unremarkable. Postoperative changes are seen in the stomach. No worrisome lytic or sclerotic lesions. Healing/healed right rib fractures versus thoracotomy changes.  IMPRESSION: 1. Interval improvement in areas of pleural/ subpleural thickening and nodularity in the right hemi thorax, without complete resolution. 2. Stable focal collection of fluid density along the lateral aspect of the right major fissure. 3. Mild subpleural fibrotic changes with more extensive fibrotic change in the right lower lobe, stable. 4. Extensive 3 vessel coronary artery calcification.   Electronically Signed   By: Lorin Picket M.D.   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  She also underwent a course of stereotactic radiotherapy to the pleural/subpleural area under the care of Dr. Tammi Klippel. Her recent scan showed interval improvement in the areas that were irradiated was no other evidence for disease progression. I discussed the scan results with the patient today.  I recommended for her to continue on observation with repeat CT scan of the chest, abdomen and pelvis in 3 months for evaluation of her disease. She was advised to call immediately if she has any concerning symptoms in the interval.  The patient voices understanding of current disease status and treatment options and is in agreement with the current care plan.  All questions were answered. The patient knows to call the clinic with any problems, questions or concerns. We can certainly see the patient much sooner if necessary.  Disclaimer: This note was dictated with voice recognition software. Similar sounding words can inadvertently be transcribed and may not be corrected upon review.

## 2013-08-22 NOTE — Telephone Encounter (Signed)
Gave pt appt for l;ab and MD on july 2015

## 2013-09-19 ENCOUNTER — Other Ambulatory Visit: Payer: Self-pay | Admitting: Physician Assistant

## 2013-11-18 ENCOUNTER — Encounter (HOSPITAL_COMMUNITY): Payer: Self-pay

## 2013-11-18 ENCOUNTER — Other Ambulatory Visit (HOSPITAL_BASED_OUTPATIENT_CLINIC_OR_DEPARTMENT_OTHER): Payer: Medicare Other

## 2013-11-18 ENCOUNTER — Ambulatory Visit (HOSPITAL_COMMUNITY)
Admission: RE | Admit: 2013-11-18 | Discharge: 2013-11-18 | Disposition: A | Discharge status: Home / Self Care | Payer: Medicare Other | Source: Ambulatory Visit | Attending: Internal Medicine | Admitting: Internal Medicine

## 2013-11-18 DIAGNOSIS — C3410 Malignant neoplasm of upper lobe, unspecified bronchus or lung: Secondary | ICD-10-CM

## 2013-11-18 DIAGNOSIS — Z90710 Acquired absence of both cervix and uterus: Secondary | ICD-10-CM | POA: Insufficient documentation

## 2013-11-18 DIAGNOSIS — C782 Secondary malignant neoplasm of pleura: Secondary | ICD-10-CM

## 2013-11-18 DIAGNOSIS — R599 Enlarged lymph nodes, unspecified: Secondary | ICD-10-CM | POA: Insufficient documentation

## 2013-11-18 DIAGNOSIS — J438 Other emphysema: Secondary | ICD-10-CM | POA: Insufficient documentation

## 2013-11-18 DIAGNOSIS — C801 Malignant (primary) neoplasm, unspecified: Secondary | ICD-10-CM

## 2013-11-18 DIAGNOSIS — J90 Pleural effusion, not elsewhere classified: Secondary | ICD-10-CM | POA: Insufficient documentation

## 2013-11-18 DIAGNOSIS — R0789 Other chest pain: Secondary | ICD-10-CM | POA: Insufficient documentation

## 2013-11-18 DIAGNOSIS — C169 Malignant neoplasm of stomach, unspecified: Secondary | ICD-10-CM | POA: Insufficient documentation

## 2013-11-18 DIAGNOSIS — J984 Other disorders of lung: Secondary | ICD-10-CM | POA: Insufficient documentation

## 2013-11-18 DIAGNOSIS — C3490 Malignant neoplasm of unspecified part of unspecified bronchus or lung: Secondary | ICD-10-CM | POA: Insufficient documentation

## 2013-11-18 DIAGNOSIS — Z4789 Encounter for other orthopedic aftercare: Secondary | ICD-10-CM | POA: Insufficient documentation

## 2013-11-18 LAB — CBC WITH DIFFERENTIAL/PLATELET
BASO%: 1.7 % (ref 0.0–2.0)
BASOS ABS: 0.1 10*3/uL (ref 0.0–0.1)
EOS ABS: 0.1 10*3/uL (ref 0.0–0.5)
EOS%: 1.8 % (ref 0.0–7.0)
HEMATOCRIT: 36.1 % (ref 34.8–46.6)
HEMOGLOBIN: 11.6 g/dL (ref 11.6–15.9)
LYMPH#: 1 10*3/uL (ref 0.9–3.3)
LYMPH%: 24.7 % (ref 14.0–49.7)
MCH: 31 pg (ref 25.1–34.0)
MCHC: 32 g/dL (ref 31.5–36.0)
MCV: 96.8 fL (ref 79.5–101.0)
MONO#: 0.7 10*3/uL (ref 0.1–0.9)
MONO%: 16.4 % — ABNORMAL HIGH (ref 0.0–14.0)
NEUT#: 2.2 10*3/uL (ref 1.5–6.5)
NEUT%: 55.4 % (ref 38.4–76.8)
PLATELETS: 183 10*3/uL (ref 145–400)
RBC: 3.73 10*6/uL (ref 3.70–5.45)
RDW: 18.3 % — ABNORMAL HIGH (ref 11.2–14.5)
WBC: 4 10*3/uL (ref 3.9–10.3)

## 2013-11-18 LAB — COMPREHENSIVE METABOLIC PANEL (CC13)
ALT: 18 U/L (ref 0–55)
ANION GAP: 8 meq/L (ref 3–11)
AST: 25 U/L (ref 5–34)
Albumin: 3.4 g/dL — ABNORMAL LOW (ref 3.5–5.0)
Alkaline Phosphatase: 100 U/L (ref 40–150)
BILIRUBIN TOTAL: 0.4 mg/dL (ref 0.20–1.20)
BUN: 8.6 mg/dL (ref 7.0–26.0)
CALCIUM: 9.9 mg/dL (ref 8.4–10.4)
CHLORIDE: 110 meq/L — AB (ref 98–109)
CO2: 26 meq/L (ref 22–29)
Creatinine: 0.9 mg/dL (ref 0.6–1.1)
GLUCOSE: 85 mg/dL (ref 70–140)
Potassium: 4.6 mEq/L (ref 3.5–5.1)
Sodium: 143 mEq/L (ref 136–145)
Total Protein: 6.7 g/dL (ref 6.4–8.3)

## 2013-11-18 MED ORDER — IOHEXOL 300 MG/ML  SOLN
80.0000 mL | Freq: Once | INTRAMUSCULAR | Status: AC | PRN
  Administered 2013-11-18: 80 mL via INTRAVENOUS

## 2013-11-23 ENCOUNTER — Telehealth: Payer: Self-pay | Admitting: Internal Medicine

## 2013-11-23 ENCOUNTER — Encounter: Payer: Self-pay | Admitting: Internal Medicine

## 2013-11-23 ENCOUNTER — Ambulatory Visit (HOSPITAL_BASED_OUTPATIENT_CLINIC_OR_DEPARTMENT_OTHER): Payer: Medicare Other | Admitting: Internal Medicine

## 2013-11-23 VITALS — BP 122/78 | HR 98 | Temp 98.5°F | Resp 18 | Ht 59.0 in | Wt 95.0 lb

## 2013-11-23 DIAGNOSIS — R0602 Shortness of breath: Secondary | ICD-10-CM

## 2013-11-23 DIAGNOSIS — C782 Secondary malignant neoplasm of pleura: Secondary | ICD-10-CM

## 2013-11-23 DIAGNOSIS — C3410 Malignant neoplasm of upper lobe, unspecified bronchus or lung: Secondary | ICD-10-CM

## 2013-11-23 DIAGNOSIS — C3491 Malignant neoplasm of unspecified part of right bronchus or lung: Secondary | ICD-10-CM

## 2013-11-23 DIAGNOSIS — R079 Chest pain, unspecified: Secondary | ICD-10-CM

## 2013-11-23 MED ORDER — PROCHLORPERAZINE MALEATE 10 MG PO TABS: 10.0000 mg | ORAL_TABLET | Freq: Four times a day (QID) | ORAL | Status: DC | PRN

## 2013-11-23 NOTE — Telephone Encounter (Signed)
gv adn printed appt sched and avs for pt for July adn Aug....sed added tx.

## 2013-11-23 NOTE — Progress Notes (Signed)
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  2) locally advanced adenocarcinoma of the stomach status post resection in July of 2002 followed by concurrent chemoradiation under the care of Dr. Lisbeth Renshaw and Dr. Jamse Arn.  PRIOR THERAPY: 1) status post wedge resection of the right upper lobe under the care of Dr. Servando Snare on 09/16/2011 and the final pathology was consistent with a spindle cell carcinoma. 2) status post curative radiotherapy to the recurrent disease in the right lung under the care of Dr. Tammi Klippel completed on 01/10/2013. 3) status post stereotactic radiotherapy to the metastatic deposits in the lung completed 06/03/2013 under the care of Dr. Tammi Klippel.  CURRENT THERAPY: Systemic chemotherapy with carboplatin for AUC of 5 and paclitaxel 175 mg/M2 every 3 weeks with Neulasta support. First dose 11/30/2013.  INTERVAL HISTORY: [name] 80 y.o. female returns to the clinic today for followup visit. The patient has been complaining of increasing pain and tightness on the right side of her chest with increasing shortness of breath for the last few weeks. She also lost few pounds recently.  She denied having any cough or hemoptysis. She has no nausea or vomiting, she has no fever, chills or night sweats. The patient had repeat CT scan of the chest performed recently and she is here for evaluation and discussion of her scan results.  MEDICAL HISTORY: Past Medical History  Diagnosis Date  . Dyslipidemia   . Arthritis     Hx of osteoarthritis  . Shortness of breath     WITH EXERTION   . COPD (chronic obstructive pulmonary disease)   . Anemia   . History of chemotherapy    carboplatin/taxol  . Lung mass   . 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 History of radiation therapy 01/13/11-02/20/11    abdomen    ALLERGIES:  has No Known Allergies.  MEDICATIONS:  Current Outpatient Prescriptions  Medication Sig Dispense Refill  . albuterol (PROVENTIL HFA;VENTOLIN HFA) 108 (90 BASE) MCG/ACT inhaler Inhale 3 puffs into the lungs every 4 (four) hours as needed. For shortness of breath       . calcium-vitamin D (OSCAL WITH D) 500-200 MG-UNIT per tablet Take 1 tablet by mouth every other day.       . cholecalciferol (VITAMIN D) 1000 UNITS tablet Take 1,000 Units by mouth daily.      . Cyanocobalamin (B-12) 100 MCG TABS Take 1 tablet by mouth as needed.       . Ferrous Sulfate (IRON) 325 (65 FE) MG TABS Take 1 tablet by mouth daily.       . fish oil-omega-3 fatty acids 1000 MG capsule Take 1 g by mouth daily.       . hydrOXYzine (ATARAX) 10 MG tablet Take 10 mg by mouth daily as needed. For itching.      . Multiple Vitamins-Minerals (CENTRUM SILVER PO) Take 1 tablet by mouth daily.       . Multiple Vitamins-Minerals (ICAPS PO) Take 1 tablet by mouth every other day.        No current facility-administered medications for this visit.    SURGICAL HISTORY:  Past Surgical History  Procedure Laterality Date  . Esophagogastrectomy, j tube 11/08/2010    .  Cholecystectomy    . Abdominal hysterectomy    . Cholecystectomy    . Cataract extraction      RIGHT EYE   . Port-a-cath removal  05/13/2011    Procedure: REMOVAL PORT-A-CATH;  Surgeon: Stark Klein, MD;  Location: [facility];  Service: General;  Laterality: N/A;  Removal port-a-cath.  . Portacath placement    . Eye surgery    . Bronchoscopy  08/28/11    rt upper lung mass bx  . Cardia resected  11/2010  . Wedge resection  09/18/11    right vats Dr. Lanelle Bal    REVIEW OF SYSTEMS:  Constitutional: negative Eyes: negative Ears, nose, mouth, throat, and face: negative Respiratory: negative Cardiovascular:  negative Gastrointestinal: negative Genitourinary:negative Integument/breast: negative Hematologic/lymphatic: negative Musculoskeletal:negative Neurological: negative Behavioral/Psych: negative Endocrine: negative Allergic/Immunologic: negative   PHYSICAL EXAMINATION: General appearance: alert, cooperative and no distress Head: Normocephalic, without obvious abnormality, atraumatic Neck: no adenopathy, no JVD, supple, symmetrical, trachea midline and thyroid not enlarged, symmetric, no tenderness/mass/nodules Lymph nodes: Cervical, supraclavicular, and axillary nodes normal. Resp: clear to auscultation bilaterally Back: symmetric, no curvature. ROM normal. No CVA tenderness. Cardio: regular rate and rhythm, S1, S2 normal, no murmur, click, rub or gallop GI: soft, non-tender; bowel sounds normal; no masses,  no organomegaly Extremities: extremities normal, atraumatic, no cyanosis or edema Neurologic: Alert and oriented X 3, normal strength and tone. Normal symmetric reflexes. Normal coordination and gait  ECOG PERFORMANCE STATUS: 1 - Symptomatic but completely ambulatory  Blood pressure 122/78, pulse 98, temperature 98.5 F (36.9 C), temperature source Oral, resp. rate 18, height 4\' 11"  (1.499 m), weight 95 lb (43.092 kg), SpO2 93.00%.  LABORATORY DATA: Lab Results  Component Value Date   WBC 4.0 11/18/2013   HGB 11.6 11/18/2013   HCT 36.1 11/18/2013   MCV 96.8 11/18/2013   PLT 183 11/18/2013      Chemistry      Component Value Date/Time   NA 143 11/18/2013 0914   NA 140 09/17/2011 0435   NA 143 07/23/2011 0916   K 4.6 11/18/2013 0914   K 3.6 09/17/2011 0435   K 3.8 07/23/2011 0916   CL 108 09/17/2011 0435   CL 104 07/23/2011 0916   CO2 26 11/18/2013 0914   CO2 26 09/17/2011 0435   CO2 23 07/23/2011 0916   BUN 8.6 11/18/2013 0914   BUN 5* 09/17/2011 0435   BUN 7 07/23/2011 0916   CREATININE 0.9 11/18/2013 0914   CREATININE 0.57 09/17/2011 0435   CREATININE 0.8 07/23/2011 0916       Component Value Date/Time   CALCIUM 9.9 11/18/2013 0914   CALCIUM 9.0 09/17/2011 0435   CALCIUM 8.9 07/23/2011 0916   ALKPHOS 100 11/18/2013 0914   ALKPHOS 100 09/17/2011 0435   ALKPHOS 109* 07/23/2011 0916   AST 25 11/18/2013 0914   AST 46* 09/17/2011 0435   AST 31 07/23/2011 0916   ALT 18 11/18/2013 0914   ALT 37* 09/17/2011 0435   ALT 22 07/23/2011 0916   BILITOT 0.40 11/18/2013 0914   BILITOT 0.5 09/17/2011 0435   BILITOT 0.60 07/23/2011 0916       RADIOGRAPHIC STUDIES: Ct Chest W Contrast  11/18/2013   CLINICAL DATA:  Followup gastric carcinoma and lung carcinoma. Completed radiation therapy and chemotherapy. Right-sided chest pain. Restaging.  EXAM: CT CHEST, ABDOMEN, AND PELVIS WITH CONTRAST  TECHNIQUE: Multidetector CT imaging of the chest, abdomen and pelvis was performed following the standard protocol during bolus administration of intravenous contrast.  CONTRAST:  59mL OMNIPAQUE IOHEXOL 300 MG/ML  SOLN  COMPARISON:  Chest CT on 08/15/2013, and PET-CT on 10/27/2012  FINDINGS:   CT CHEST FINDINGS  New small right pleural effusion is seen. Increased pleural and subpleural nodularity is seen in the peripheral right mid and lower lung fields. Two dominant enhancing masslike opacities in the peripheral right lung field are new, and measure 1.4 x 1.9 cm on image 29 and 2.0 x 2.9 cm on image 34. This is consistent with worsening metastatic disease.  Mild mediastinal lymphadenopathy in the precarinal and AP window regions is stable with largest lymph node measuring 11 mm on image 24. No new or increased lymphadenopathy identified within the thorax. Moderate emphysema again demonstrated. Several nondisplaced right lateral rib fractures are again seen which show early healing. No lytic or sclerotic bone lesions identified.    CT ABDOMEN AND PELVIS FINDINGS  The liver and adrenal glands are normal in appearance. Pancreas, spleen, and kidneys are also normal in appearance. No evidence of hydronephrosis.   Gastric surgical staples and mild diffuse gastric wall thickening are stable and chronic. No soft tissue masses or lymphadenopathy identified within the abdomen or pelvis. Prior hysterectomy noted. Adnexal regions are unremarkable in appearance.  No evidence of inflammatory process or abnormal fluid collections. No evidence of bowel obstruction. Large stool burden seen throughout the majority of the colon. No suspicious bone lesions identified.    IMPRESSION: Increased pleural and subpleural metastatic disease in the lateral right hemithorax, with small right pleural effusion.  Stable mild mediastinal lymphadenopathy.  Stable nondisplaced right lateral rib fractures. No lytic or sclerotic bone metastases identified.  No evidence of abdominal or pelvic metastatic disease.  Large stool burden noted; suggest clinical correlation for possible constipation.   Electronically Signed   By: Earle Gell M.D.   On: 11/18/2013 15:13   ASSESSMENT AND PLAN: This is a very pleasant 80 years old [place] American female with recurrent spindle cell carcinoma of the right lung consistent with sarcomatoid carcinoma, status post curative radiotherapy completed on 01/10/2013.  She also underwent a course of stereotactic radiotherapy to the pleural/subpleural area under the care of Dr. Tammi Klippel. Her recent scan showed evidence for disease progression with increased pleural and subpleural metastatic disease in the lateral right hemithorax. I discussed the scan results and showed the images to the patient today. She is currently asymptomatic from her disease with pain on the right side of the chest. I gave the patient the option of palliative care and hospice referral versus consideration of systemic chemotherapy. She is interested in treatment and she gives a verbal consent for treatment was carboplatin for AUC of 5 and paclitaxel 175 mg/M2 every 3 weeks with Neulasta support. I discussed with the patient adverse effect of this  treatment including but not limited to alopecia, myelosuppression, nausea and vomiting, peripheral neuropathy, liver or renal dysfunction. She is expected to start the first cycle of this treatment next week. I will call her pharmacy was prescription for Compazine 10 mg by mouth every 6 hours as needed for nausea. She would come back for followup visit in 2 weeks for reevaluation and management any adverse effect of her treatment. She was advised to call immediately if she has any concerning symptoms in the interval.  The patient voices understanding of current disease status and treatment options and is in agreement with the current care plan.  All questions were answered. The patient knows to call the clinic with any problems, questions or concerns.  We can certainly see the patient much sooner if necessary.  Disclaimer: This note was dictated with voice recognition software. Similar sounding words can inadvertently be transcribed and may not be corrected upon review.

## 2013-11-25 ENCOUNTER — Telehealth: Payer: Self-pay | Admitting: Medical Oncology

## 2013-11-25 NOTE — Telephone Encounter (Signed)
Pt asking if she can get chemo Monday . She is concerned because she still "has blisters from an allergic reaction to Remeron" ( said she saw Dr Allyson Sabal ). She still takes benadryl daily for the  Itching. I told her to keep appt and a nurse will se her in chemo room. I requested dermatology notes-Dr Allyson Sabal. I also told Aracelly to hold her  benadryl Monday morning before she comes in because she has some ordered before her chemotherapy. She voiced understanding.

## 2013-11-30 ENCOUNTER — Other Ambulatory Visit (HOSPITAL_BASED_OUTPATIENT_CLINIC_OR_DEPARTMENT_OTHER): Payer: Medicare Other

## 2013-11-30 ENCOUNTER — Ambulatory Visit (HOSPITAL_BASED_OUTPATIENT_CLINIC_OR_DEPARTMENT_OTHER): Payer: Medicare Other

## 2013-11-30 VITALS — BP 119/80 | HR 87 | Temp 97.6°F

## 2013-11-30 DIAGNOSIS — C3491 Malignant neoplasm of unspecified part of right bronchus or lung: Secondary | ICD-10-CM

## 2013-11-30 DIAGNOSIS — C801 Malignant (primary) neoplasm, unspecified: Secondary | ICD-10-CM

## 2013-11-30 DIAGNOSIS — C3410 Malignant neoplasm of upper lobe, unspecified bronchus or lung: Secondary | ICD-10-CM

## 2013-11-30 DIAGNOSIS — C782 Secondary malignant neoplasm of pleura: Secondary | ICD-10-CM

## 2013-11-30 DIAGNOSIS — Z5111 Encounter for antineoplastic chemotherapy: Secondary | ICD-10-CM

## 2013-11-30 LAB — CBC WITH DIFFERENTIAL/PLATELET
BASO%: 0.1 % (ref 0.0–2.0)
BASOS ABS: 0 10*3/uL (ref 0.0–0.1)
EOS%: 1.2 % (ref 0.0–7.0)
Eosinophils Absolute: 0.1 10*3/uL (ref 0.0–0.5)
HCT: 39 % (ref 34.8–46.6)
HGB: 12.3 g/dL (ref 11.6–15.9)
LYMPH%: 19.4 % (ref 14.0–49.7)
MCH: 31.1 pg (ref 25.1–34.0)
MCHC: 31.6 g/dL (ref 31.5–36.0)
MCV: 98.4 fL (ref 79.5–101.0)
MONO#: 0.6 10*3/uL (ref 0.1–0.9)
MONO%: 13.8 % (ref 0.0–14.0)
NEUT#: 2.8 10*3/uL (ref 1.5–6.5)
NEUT%: 65.5 % (ref 38.4–76.8)
Platelets: 193 10*3/uL (ref 145–400)
RBC: 3.97 10*6/uL (ref 3.70–5.45)
RDW: 16.2 % — AB (ref 11.2–14.5)
WBC: 4.3 10*3/uL (ref 3.9–10.3)
lymph#: 0.8 10*3/uL — ABNORMAL LOW (ref 0.9–3.3)

## 2013-11-30 LAB — COMPREHENSIVE METABOLIC PANEL (CC13)
ALBUMIN: 3.4 g/dL — AB (ref 3.5–5.0)
ALT: 13 U/L (ref 0–55)
ANION GAP: 10 meq/L (ref 3–11)
AST: 24 U/L (ref 5–34)
Alkaline Phosphatase: 98 U/L (ref 40–150)
BUN: 13.2 mg/dL (ref 7.0–26.0)
CALCIUM: 9.6 mg/dL (ref 8.4–10.4)
CO2: 23 meq/L (ref 22–29)
Chloride: 109 mEq/L (ref 98–109)
Creatinine: 0.9 mg/dL (ref 0.6–1.1)
Glucose: 105 mg/dl (ref 70–140)
POTASSIUM: 3.9 meq/L (ref 3.5–5.1)
Sodium: 142 mEq/L (ref 136–145)
Total Bilirubin: 0.42 mg/dL (ref 0.20–1.20)
Total Protein: 6.9 g/dL (ref 6.4–8.3)

## 2013-11-30 MED ORDER — SODIUM CHLORIDE 0.9 % IV SOLN
277.5000 mg | Freq: Once | INTRAVENOUS | Status: AC
  Administered 2013-11-30: 280 mg via INTRAVENOUS
  Filled 2013-11-30: qty 28

## 2013-11-30 MED ORDER — SODIUM CHLORIDE 0.9 % IV SOLN
Freq: Once | INTRAVENOUS | Status: AC
Start: 2013-11-30 — End: 2013-11-30
  Administered 2013-11-30: 09:00:00 via INTRAVENOUS

## 2013-11-30 MED ORDER — ONDANSETRON 16 MG/50ML IVPB (CHCC)
16.0000 mg | Freq: Once | INTRAVENOUS | Status: AC
  Administered 2013-11-30: 16 mg via INTRAVENOUS

## 2013-11-30 MED ORDER — FAMOTIDINE IN NACL 20-0.9 MG/50ML-% IV SOLN
INTRAVENOUS | Status: AC
  Filled 2013-11-30: qty 50

## 2013-11-30 MED ORDER — PACLITAXEL CHEMO INJECTION 300 MG/50ML
175.0000 mg/m2 | Freq: Once | INTRAVENOUS | Status: AC
  Administered 2013-11-30: 234 mg via INTRAVENOUS
  Filled 2013-11-30: qty 39

## 2013-11-30 MED ORDER — DEXAMETHASONE SODIUM PHOSPHATE 20 MG/5ML IJ SOLN
20.0000 mg | Freq: Once | INTRAMUSCULAR | Status: AC
  Administered 2013-11-30: 20 mg via INTRAVENOUS

## 2013-11-30 MED ORDER — DIPHENHYDRAMINE HCL 50 MG/ML IJ SOLN
INTRAMUSCULAR | Status: AC
  Filled 2013-11-30: qty 1

## 2013-11-30 MED ORDER — DEXAMETHASONE SODIUM PHOSPHATE 20 MG/5ML IJ SOLN
INTRAMUSCULAR | Status: AC
  Filled 2013-11-30: qty 5

## 2013-11-30 MED ORDER — ONDANSETRON 16 MG/50ML IVPB (CHCC)
INTRAVENOUS | Status: AC
  Filled 2013-11-30: qty 16

## 2013-11-30 MED ORDER — DIPHENHYDRAMINE HCL 50 MG/ML IJ SOLN
50.0000 mg | Freq: Once | INTRAMUSCULAR | Status: AC
  Administered 2013-11-30: 50 mg via INTRAVENOUS

## 2013-11-30 MED ORDER — FAMOTIDINE IN NACL 20-0.9 MG/50ML-% IV SOLN
20.0000 mg | Freq: Once | INTRAVENOUS | Status: AC
  Administered 2013-11-30: 20 mg via INTRAVENOUS

## 2013-11-30 NOTE — Progress Notes (Signed)
Pt received taxol/carboplatin in the past.  Per Nance Pew, RPH no need to treat patient as first time taxol.  Pt notified that need for [place] test dose may be necessary in the near future.  Pt verbalized understanding.

## 2013-11-30 NOTE — Patient Instructions (Signed)
[facility] Discharge Instructions for Patients Receiving Chemotherapy  Today you received the following chemotherapy agents taxol/carboplatin.    To help prevent nausea and vomiting after your treatment, we encourage you to take your nausea medication as directed.     If you develop nausea and vomiting that is not controlled by your nausea medication, call the clinic.   BELOW ARE SYMPTOMS THAT SHOULD BE REPORTED IMMEDIATELY:  *FEVER GREATER THAN 100.5 F  *CHILLS WITH OR WITHOUT FEVER  NAUSEA AND VOMITING THAT IS NOT CONTROLLED WITH YOUR NAUSEA MEDICATION  *UNUSUAL SHORTNESS OF BREATH  *UNUSUAL BRUISING OR BLEEDING  TENDERNESS IN MOUTH AND THROAT WITH OR WITHOUT PRESENCE OF ULCERS  *URINARY PROBLEMS  *BOWEL PROBLEMS  UNUSUAL RASH Items with * indicate a potential emergency and should be followed up as soon as possible.  Feel free to call the clinic you have any questions or concerns. The clinic phone number is [phone].   Paclitaxel injection What is this medicine? PACLITAXEL (PAK li TAX el) is a chemotherapy drug. It targets fast dividing cells, like cancer cells, and causes these cells to die. This medicine is used to treat ovarian cancer, breast cancer, and other cancers. This medicine may be used for other purposes; ask your health care provider or pharmacist if you have questions. COMMON BRAND NAME(S): Onxol, Taxol What should I tell my health care provider before I take this medicine? They need to know if you have any of these conditions: -blood disorders -irregular heartbeat -infection (especially a virus infection such as chickenpox, cold sores, or herpes) -liver disease -previous or ongoing radiation therapy -an unusual or allergic reaction to paclitaxel, alcohol, polyoxyethylated castor oil, other chemotherapy agents, other medicines, foods, dyes, or preservatives -pregnant or trying to get pregnant -breast-feeding How should I use this  medicine? This drug is given as an infusion into a vein. It is administered in a hospital or clinic by a specially trained health care professional. Talk to your pediatrician regarding the use of this medicine in children. Special care may be needed. Overdosage: If you think you have taken too much of this medicine contact a poison control center or emergency room at once. NOTE: This medicine is only for you. Do not share this medicine with others. What if I miss a dose? It is important not to miss your dose. Call your doctor or health care professional if you are unable to keep an appointment. What may interact with this medicine? Do not take this medicine with any of the following medications: -disulfiram -metronidazole This medicine may also interact with the following medications: -cyclosporine -diazepam -ketoconazole -medicines to increase blood counts like filgrastim, pegfilgrastim, sargramostim -other chemotherapy drugs like cisplatin, doxorubicin, epirubicin, etoposide, teniposide, vincristine -quinidine -testosterone -vaccines -verapamil Talk to your doctor or health care professional before taking any of these medicines: -acetaminophen -aspirin -ibuprofen -ketoprofen -naproxen This list may not describe all possible interactions. Give your health care provider a list of all the medicines, herbs, non-prescription drugs, or dietary supplements you use. Also tell them if you smoke, drink alcohol, or use illegal drugs. Some items may interact with your medicine. What should I watch for while using this medicine? Your condition will be monitored carefully while you are receiving this medicine. You will need important blood work done while you are taking this medicine. This drug may make you feel generally unwell. This is not uncommon, as chemotherapy can affect healthy cells as well as cancer cells. Report any side effects. Continue  your course of treatment even though you feel ill  unless your doctor tells you to stop. In some cases, you may be given additional medicines to help with side effects. Follow all directions for their use. Call your doctor or health care professional for advice if you get a fever, chills or sore throat, or other symptoms of a cold or flu. Do not treat yourself. This drug decreases your body's ability to fight infections. Try to avoid being around people who are sick. This medicine may increase your risk to bruise or bleed. Call your doctor or health care professional if you notice any unusual bleeding. Be careful brushing and flossing your teeth or using a toothpick because you may get an infection or bleed more easily. If you have any dental work done, tell your dentist you are receiving this medicine. Avoid taking products that contain aspirin, acetaminophen, ibuprofen, naproxen, or ketoprofen unless instructed by your doctor. These medicines may hide a fever. Do not become pregnant while taking this medicine. Women should inform their doctor if they wish to become pregnant or think they might be pregnant. There is a potential for serious side effects to an unborn child. Talk to your health care professional or pharmacist for more information. Do not breast-feed an infant while taking this medicine. Men are advised not to father a child while receiving this medicine. What side effects may I notice from receiving this medicine? Side effects that you should report to your doctor or health care professional as soon as possible: -allergic reactions like skin rash, itching or hives, swelling of the face, lips, or tongue -low blood counts - This drug may decrease the number of white blood cells, red blood cells and platelets. You may be at increased risk for infections and bleeding. -signs of infection - fever or chills, cough, sore throat, pain or difficulty passing urine -signs of decreased platelets or bleeding - bruising, pinpoint red spots on the skin,  black, tarry stools, nosebleeds -signs of decreased red blood cells - unusually weak or tired, fainting spells, lightheadedness -breathing problems -chest pain -high or low blood pressure -mouth sores -nausea and vomiting -pain, swelling, redness or irritation at the injection site -pain, tingling, numbness in the hands or feet -slow or irregular heartbeat -swelling of the ankle, feet, hands Side effects that usually do not require medical attention (report to your doctor or health care professional if they continue or are bothersome): -bone pain -complete hair loss including hair on your head, underarms, pubic hair, eyebrows, and eyelashes -changes in the color of fingernails -diarrhea -loosening of the fingernails -loss of appetite -muscle or joint pain -red flush to skin -sweating This list may not describe all possible side effects. Call your doctor for medical advice about side effects. You may report side effects to FDA at 1-800-FDA-1088. Where should I keep my medicine? This drug is given in a hospital or clinic and will not be stored at home. NOTE: This sheet is a summary. It may not cover all possible information. If you have questions about this medicine, talk to your doctor, pharmacist, or health care provider.  2015, [employer] Standard. (2012-06-14 16:41:21)  Carboplatin injection What is this medicine? CARBOPLATIN (KAR boe pla tin) is a chemotherapy drug. It targets fast dividing cells, like cancer cells, and causes these cells to die. This medicine is used to treat ovarian cancer and many other cancers. This medicine may be used for other purposes; ask your health care provider or pharmacist if you  have questions. COMMON BRAND NAME(S): Paraplatin What should I tell my health care provider before I take this medicine? They need to know if you have any of these conditions: -blood disorders -hearing problems -kidney disease -recent or ongoing radiation  therapy -an unusual or allergic reaction to carboplatin, cisplatin, other chemotherapy, other medicines, foods, dyes, or preservatives -pregnant or trying to get pregnant -breast-feeding How should I use this medicine? This drug is usually given as an infusion into a vein. It is administered in a hospital or clinic by a specially trained health care professional. Talk to your pediatrician regarding the use of this medicine in children. Special care may be needed. Overdosage: If you think you have taken too much of this medicine contact a poison control center or emergency room at once. NOTE: This medicine is only for you. Do not share this medicine with others. What if I miss a dose? It is important not to miss a dose. Call your doctor or health care professional if you are unable to keep an appointment. What may interact with this medicine? -medicines for seizures -medicines to increase blood counts like filgrastim, pegfilgrastim, sargramostim -some antibiotics like amikacin, gentamicin, neomycin, streptomycin, tobramycin -vaccines Talk to your doctor or health care professional before taking any of these medicines: -acetaminophen -aspirin -ibuprofen -ketoprofen -naproxen This list may not describe all possible interactions. Give your health care provider a list of all the medicines, herbs, non-prescription drugs, or dietary supplements you use. Also tell them if you smoke, drink alcohol, or use illegal drugs. Some items may interact with your medicine. What should I watch for while using this medicine? Your condition will be monitored carefully while you are receiving this medicine. You will need important blood work done while you are taking this medicine. This drug may make you feel generally unwell. This is not uncommon, as chemotherapy can affect healthy cells as well as cancer cells. Report any side effects. Continue your course of treatment even though you feel ill unless your doctor  tells you to stop. In some cases, you may be given additional medicines to help with side effects. Follow all directions for their use. Call your doctor or health care professional for advice if you get a fever, chills or sore throat, or other symptoms of a cold or flu. Do not treat yourself. This drug decreases your body's ability to fight infections. Try to avoid being around people who are sick. This medicine may increase your risk to bruise or bleed. Call your doctor or health care professional if you notice any unusual bleeding. Be careful brushing and flossing your teeth or using a toothpick because you may get an infection or bleed more easily. If you have any dental work done, tell your dentist you are receiving this medicine. Avoid taking products that contain aspirin, acetaminophen, ibuprofen, naproxen, or ketoprofen unless instructed by your doctor. These medicines may hide a fever. Do not become pregnant while taking this medicine. Women should inform their doctor if they wish to become pregnant or think they might be pregnant. There is a potential for serious side effects to an unborn child. Talk to your health care professional or pharmacist for more information. Do not breast-feed an infant while taking this medicine. What side effects may I notice from receiving this medicine? Side effects that you should report to your doctor or health care professional as soon as possible: -allergic reactions like skin rash, itching or hives, swelling of the face, lips, or tongue -signs of  infection - fever or chills, cough, sore throat, pain or difficulty passing urine -signs of decreased platelets or bleeding - bruising, pinpoint red spots on the skin, black, tarry stools, nosebleeds -signs of decreased red blood cells - unusually weak or tired, fainting spells, lightheadedness -breathing problems -changes in hearing -changes in vision -chest pain -high blood pressure -low blood counts - This  drug may decrease the number of white blood cells, red blood cells and platelets. You may be at increased risk for infections and bleeding. -nausea and vomiting -pain, swelling, redness or irritation at the injection site -pain, tingling, numbness in the hands or feet -problems with balance, talking, walking -trouble passing urine or change in the amount of urine Side effects that usually do not require medical attention (report to your doctor or health care professional if they continue or are bothersome): -hair loss -loss of appetite -metallic taste in the mouth or changes in taste This list may not describe all possible side effects. Call your doctor for medical advice about side effects. You may report side effects to FDA at 1-800-FDA-1088. Where should I keep my medicine? This drug is given in a hospital or clinic and will not be stored at home. NOTE: This sheet is a summary. It may not cover all possible information. If you have questions about this medicine, talk to your doctor, pharmacist, or health care provider.  2015, [employer] Standard. (2007-07-27 14:38:05)

## 2013-12-01 ENCOUNTER — Ambulatory Visit (HOSPITAL_BASED_OUTPATIENT_CLINIC_OR_DEPARTMENT_OTHER): Payer: Medicare Other

## 2013-12-01 ENCOUNTER — Telehealth: Payer: Self-pay | Admitting: *Deleted

## 2013-12-01 VITALS — BP 115/67 | HR 97 | Temp 97.8°F

## 2013-12-01 DIAGNOSIS — C801 Malignant (primary) neoplasm, unspecified: Secondary | ICD-10-CM

## 2013-12-01 DIAGNOSIS — C3410 Malignant neoplasm of upper lobe, unspecified bronchus or lung: Secondary | ICD-10-CM

## 2013-12-01 DIAGNOSIS — C782 Secondary malignant neoplasm of pleura: Secondary | ICD-10-CM

## 2013-12-01 DIAGNOSIS — Z5189 Encounter for other specified aftercare: Secondary | ICD-10-CM

## 2013-12-01 MED ORDER — PEGFILGRASTIM INJECTION 6 MG/0.6ML
6.0000 mg | Freq: Once | SUBCUTANEOUS | Status: AC
  Administered 2013-12-01: 6 mg via SUBCUTANEOUS
  Filled 2013-12-01: qty 0.6

## 2013-12-01 NOTE — Patient Instructions (Signed)
Call if temperature goes above 100.5.  Use Tylenol, Ibuprofen, or Aleve for bone aches. Continue to drink plenty of fluids.  Pegfilgrastim injection What is this medicine? PEGFILGRASTIM (peg fil GRA stim) is a long-acting granulocyte colony-stimulating factor that stimulates the growth of neutrophils, a type of white blood cell important in the body's fight against infection. It is used to reduce the incidence of fever and infection in patients with certain types of cancer who are receiving chemotherapy that affects the bone marrow. This medicine may be used for other purposes; ask your health care provider or pharmacist if you have questions. COMMON BRAND NAME(S): Neulasta What should I tell my health care provider before I take this medicine? They need to know if you have any of these conditions: -latex allergy -ongoing radiation therapy -sickle cell disease -skin reactions to acrylic adhesives (On-Body Injector only) -an unusual or allergic reaction to pegfilgrastim, filgrastim, other medicines, foods, dyes, or preservatives -pregnant or trying to get pregnant -breast-feeding How should I use this medicine? This medicine is for injection under the skin. If you get this medicine at home, you will be taught how to prepare and give the pre-filled syringe or how to use the On-body Injector. Refer to the patient Instructions for Use for detailed instructions. Use exactly as directed. Take your medicine at regular intervals. Do not take your medicine more often than directed. It is important that you put your used needles and syringes in a special sharps container. Do not put them in a trash can. If you do not have a sharps container, call your pharmacist or healthcare provider to get one. Talk to your pediatrician regarding the use of this medicine in children. Special care may be needed. Overdosage: If you think you have taken too much of this medicine contact a poison control center or emergency  room at once. NOTE: This medicine is only for you. Do not share this medicine with others. What if I miss a dose? It is important not to miss your dose. Call your doctor or health care professional if you miss your dose. If you miss a dose due to an On-body Injector failure or leakage, a new dose should be administered as soon as possible using a single prefilled syringe for manual use. What may interact with this medicine? Interactions have not been studied. Give your health care provider a list of all the medicines, herbs, non-prescription drugs, or dietary supplements you use. Also tell them if you smoke, drink alcohol, or use illegal drugs. Some items may interact with your medicine. This list may not describe all possible interactions. Give your health care provider a list of all the medicines, herbs, non-prescription drugs, or dietary supplements you use. Also tell them if you smoke, drink alcohol, or use illegal drugs. Some items may interact with your medicine. What should I watch for while using this medicine? You may need blood work done while you are taking this medicine. If you are going to need a MRI, CT scan, or other procedure, tell your doctor that you are using this medicine (On-Body Injector only). What side effects may I notice from receiving this medicine? Side effects that you should report to your doctor or health care professional as soon as possible: -allergic reactions like skin rash, itching or hives, swelling of the face, lips, or tongue -dizziness -fever -pain, redness, or irritation at site where injected -pinpoint red spots on the skin -shortness of breath or breathing problems -stomach or side pain, or  pain at the shoulder -swelling -tiredness -trouble passing urine Side effects that usually do not require medical attention (report to your doctor or health care professional if they continue or are bothersome): -bone pain -muscle pain This list may not describe  all possible side effects. Call your doctor for medical advice about side effects. You may report side effects to FDA at 1-800-FDA-1088. Where should I keep my medicine? Keep out of the reach of children. Store pre-filled syringes in a refrigerator between 2 and 8 degrees C (36 and 46 degrees F). Do not freeze. Keep in carton to protect from light. Throw away this medicine if it is left out of the refrigerator for more than 48 hours. Throw away any unused medicine after the expiration date. NOTE: This sheet is a summary. It may not cover all possible information. If you have questions about this medicine, talk to your doctor, pharmacist, or health care provider.  2015, [employer] Standard. (2013-07-21 16:14:05)

## 2013-12-01 NOTE — Telephone Encounter (Signed)
[name] here for Neulasta injection following 1st taxol/carbo chemo treatment.  States that she is doing well.  No nausea, vomiting diarrhea.  Is not drinking much.  Encouraged to drink more during the day to prevent dehydration and to flush kidneys.  All question answered.  Knows to call if she has any problems or concerns.

## 2013-12-07 ENCOUNTER — Other Ambulatory Visit (HOSPITAL_BASED_OUTPATIENT_CLINIC_OR_DEPARTMENT_OTHER): Payer: Medicare Other

## 2013-12-07 ENCOUNTER — Ambulatory Visit (HOSPITAL_BASED_OUTPATIENT_CLINIC_OR_DEPARTMENT_OTHER): Payer: Medicare Other | Admitting: Nurse Practitioner

## 2013-12-07 ENCOUNTER — Telehealth: Payer: Self-pay | Admitting: Nurse Practitioner

## 2013-12-07 VITALS — BP 142/75 | HR 87 | Temp 97.9°F | Resp 18 | Ht 59.0 in | Wt 92.7 lb

## 2013-12-07 DIAGNOSIS — C3491 Malignant neoplasm of unspecified part of right bronchus or lung: Secondary | ICD-10-CM

## 2013-12-07 DIAGNOSIS — C782 Secondary malignant neoplasm of pleura: Secondary | ICD-10-CM

## 2013-12-07 DIAGNOSIS — C3410 Malignant neoplasm of upper lobe, unspecified bronchus or lung: Secondary | ICD-10-CM

## 2013-12-07 DIAGNOSIS — C801 Malignant (primary) neoplasm, unspecified: Secondary | ICD-10-CM

## 2013-12-07 LAB — CBC WITH DIFFERENTIAL/PLATELET
BASO%: 0.4 % (ref 0.0–2.0)
Basophils Absolute: 0 10*3/uL (ref 0.0–0.1)
EOS%: 3.7 % (ref 0.0–7.0)
Eosinophils Absolute: 0.1 10*3/uL (ref 0.0–0.5)
HCT: 35.8 % (ref 34.8–46.6)
HGB: 11.3 g/dL — ABNORMAL LOW (ref 11.6–15.9)
LYMPH#: 0.7 10*3/uL — AB (ref 0.9–3.3)
LYMPH%: 20.2 % (ref 14.0–49.7)
MCH: 30.8 pg (ref 25.1–34.0)
MCHC: 31.4 g/dL — ABNORMAL LOW (ref 31.5–36.0)
MCV: 98.2 fL (ref 79.5–101.0)
MONO#: 1.1 10*3/uL — ABNORMAL HIGH (ref 0.1–0.9)
MONO%: 30.4 % — AB (ref 0.0–14.0)
NEUT#: 1.6 10*3/uL (ref 1.5–6.5)
NEUT%: 45.3 % (ref 38.4–76.8)
Platelets: 111 10*3/uL — ABNORMAL LOW (ref 145–400)
RBC: 3.65 10*6/uL — AB (ref 3.70–5.45)
RDW: 15.4 % — AB (ref 11.2–14.5)
WBC: 3.5 10*3/uL — ABNORMAL LOW (ref 3.9–10.3)

## 2013-12-07 LAB — COMPREHENSIVE METABOLIC PANEL (CC13)
ALK PHOS: 100 U/L (ref 40–150)
ALT: 14 U/L (ref 0–55)
AST: 20 U/L (ref 5–34)
Albumin: 3.5 g/dL (ref 3.5–5.0)
Anion Gap: 8 mEq/L (ref 3–11)
BUN: 11.6 mg/dL (ref 7.0–26.0)
CO2: 24 mEq/L (ref 22–29)
Calcium: 9.4 mg/dL (ref 8.4–10.4)
Chloride: 110 mEq/L — ABNORMAL HIGH (ref 98–109)
Creatinine: 0.9 mg/dL (ref 0.6–1.1)
Glucose: 88 mg/dl (ref 70–140)
POTASSIUM: 3.6 meq/L (ref 3.5–5.1)
SODIUM: 143 meq/L (ref 136–145)
TOTAL PROTEIN: 6.9 g/dL (ref 6.4–8.3)
Total Bilirubin: 0.43 mg/dL (ref 0.20–1.20)

## 2013-12-07 NOTE — Progress Notes (Signed)
  [facility] OFFICE PROGRESS NOTE   Diagnosis:  Recurrent spindle cell carcinoma, sarcomatoid carcinoma of the right lung.  INTERVAL HISTORY:   Ms. Bruni returns as scheduled. She completed cycle 1 Taxol/carboplatin 11/30/2013. She received Neulasta support. She had a single episode of nausea/vomiting following the chemotherapy. No mouth sores. No diarrhea or constipation. She denies numbness/tingling in her hands or feet. She had some "aching" discomfort of the lower body for a few days after treatment. She denies pain at present. No cough. She has stable dyspnea on exertion. No fever. No skin rash. She continues to have a good appetite and energy level.  Objective:  Vital signs in last 24 hours:  Blood pressure 142/75, pulse 87, temperature 97.9 F (36.6 C), temperature source Oral, resp. rate 18, height 4\' 11"  (1.499 m), weight 92 lb 11.2 oz (42.048 kg).    HEENT: No thrush or ulcerations. Lymphatics: No palpable cervical, supraclavicular or axillary lymph nodes. Resp: Lungs are clear. No wheezes or rales. Cardio: Regular rate and rhythm. GI: Abdomen soft and nontender. No hepatomegaly. Vascular: No leg edema. Calves nontender. Neuro: Vibratory sense mildly decreased over the fingertips per tuning fork exam.    Lab Results:  Lab Results  Component Value Date   WBC 3.5* 12/07/2013   HGB 11.3* 12/07/2013   HCT 35.8 12/07/2013   MCV 98.2 12/07/2013   PLT 111* 12/07/2013   NEUTROABS 1.6 12/07/2013    Imaging:  No results found.  Medications: I have reviewed the patient's current medications.  Assessment/Plan: 1. Recurrent spindle cell carcinoma, sarcomatoid carcinoma of the right lung.   Initial diagnosis dates to March 2013.   Status post wedge resection of the right upper lobe 09/16/2011 with final pathology consistent with a spindle cell carcinoma.   Status post radiation to recurrent disease in the right lung completed 01/10/2013.   Status post  stereotactic radiotherapy to metastatic deposits in the lung completed 06/03/2013.   Restaging CT evaluation 11/18/2013 with increased pleural and subpleural metastatic disease in the lateral right hemithorax with a small right pleural effusion; stable mild mediastinal lymphadenopathy.   Status post cycle 1 Taxol/carboplatin 11/30/2013. 2. 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   Disposition: Ms. Schram appears stable. She tolerated the first cycle of Taxol/carboplatin well. She will return for a followup visit and cycle 2 Taxol/carboplatin on 11/30/2013. She will contact the office in the interim with any problems.   Ned Card ANP/GNP-BC   12/07/2013  12:15 PM

## 2013-12-14 ENCOUNTER — Other Ambulatory Visit (HOSPITAL_BASED_OUTPATIENT_CLINIC_OR_DEPARTMENT_OTHER): Payer: Medicare Other

## 2013-12-14 DIAGNOSIS — C3410 Malignant neoplasm of upper lobe, unspecified bronchus or lung: Secondary | ICD-10-CM

## 2013-12-14 DIAGNOSIS — C782 Secondary malignant neoplasm of pleura: Secondary | ICD-10-CM

## 2013-12-14 DIAGNOSIS — C3491 Malignant neoplasm of unspecified part of right bronchus or lung: Secondary | ICD-10-CM

## 2013-12-14 LAB — CBC WITH DIFFERENTIAL/PLATELET
BASO%: 0.4 % (ref 0.0–2.0)
Basophils Absolute: 0.1 10*3/uL (ref 0.0–0.1)
EOS ABS: 0.2 10*3/uL (ref 0.0–0.5)
EOS%: 1.1 % (ref 0.0–7.0)
HCT: 37.8 % (ref 34.8–46.6)
HGB: 11.9 g/dL (ref 11.6–15.9)
LYMPH%: 6.5 % — ABNORMAL LOW (ref 14.0–49.7)
MCH: 30.9 pg (ref 25.1–34.0)
MCHC: 31.4 g/dL — ABNORMAL LOW (ref 31.5–36.0)
MCV: 98.3 fL (ref 79.5–101.0)
MONO#: 1.4 10*3/uL — ABNORMAL HIGH (ref 0.1–0.9)
MONO%: 7.7 % (ref 0.0–14.0)
NEUT%: 84.3 % — ABNORMAL HIGH (ref 38.4–76.8)
NEUTROS ABS: 15.3 10*3/uL — AB (ref 1.5–6.5)
PLATELETS: 179 10*3/uL (ref 145–400)
RBC: 3.85 10*6/uL (ref 3.70–5.45)
RDW: 15.9 % — ABNORMAL HIGH (ref 11.2–14.5)
WBC: 18.1 10*3/uL — ABNORMAL HIGH (ref 3.9–10.3)
lymph#: 1.2 10*3/uL (ref 0.9–3.3)

## 2013-12-14 LAB — COMPREHENSIVE METABOLIC PANEL (CC13)
ALT: 15 U/L (ref 0–55)
ANION GAP: 9 meq/L (ref 3–11)
AST: 21 U/L (ref 5–34)
Albumin: 3.8 g/dL (ref 3.5–5.0)
Alkaline Phosphatase: 174 U/L — ABNORMAL HIGH (ref 40–150)
BILIRUBIN TOTAL: 0.32 mg/dL (ref 0.20–1.20)
BUN: 9.5 mg/dL (ref 7.0–26.0)
CALCIUM: 10 mg/dL (ref 8.4–10.4)
CO2: 25 mEq/L (ref 22–29)
CREATININE: 0.9 mg/dL (ref 0.6–1.1)
Chloride: 108 mEq/L (ref 98–109)
GLUCOSE: 108 mg/dL (ref 70–140)
Potassium: 4 mEq/L (ref 3.5–5.1)
Sodium: 142 mEq/L (ref 136–145)
Total Protein: 7.4 g/dL (ref 6.4–8.3)

## 2013-12-20 ENCOUNTER — Encounter: Payer: Self-pay | Admitting: Physician Assistant

## 2013-12-20 ENCOUNTER — Telehealth: Payer: Self-pay | Admitting: *Deleted

## 2013-12-20 ENCOUNTER — Other Ambulatory Visit (HOSPITAL_BASED_OUTPATIENT_CLINIC_OR_DEPARTMENT_OTHER): Payer: Medicare Other

## 2013-12-20 ENCOUNTER — Telehealth: Payer: Self-pay | Admitting: Physician Assistant

## 2013-12-20 ENCOUNTER — Encounter: Payer: Self-pay | Admitting: Internal Medicine

## 2013-12-20 ENCOUNTER — Ambulatory Visit (HOSPITAL_BASED_OUTPATIENT_CLINIC_OR_DEPARTMENT_OTHER): Payer: Medicare Other | Admitting: Physician Assistant

## 2013-12-20 VITALS — BP 125/78 | HR 93 | Temp 97.8°F | Resp 18 | Ht 59.0 in | Wt 89.1 lb

## 2013-12-20 DIAGNOSIS — C782 Secondary malignant neoplasm of pleura: Secondary | ICD-10-CM

## 2013-12-20 DIAGNOSIS — C3410 Malignant neoplasm of upper lobe, unspecified bronchus or lung: Secondary | ICD-10-CM

## 2013-12-20 DIAGNOSIS — C801 Malignant (primary) neoplasm, unspecified: Secondary | ICD-10-CM

## 2013-12-20 DIAGNOSIS — C3491 Malignant neoplasm of unspecified part of right bronchus or lung: Secondary | ICD-10-CM

## 2013-12-20 LAB — CBC WITH DIFFERENTIAL/PLATELET
BASO%: 0.6 % (ref 0.0–2.0)
Basophils Absolute: 0.1 10*3/uL (ref 0.0–0.1)
EOS ABS: 0.2 10*3/uL (ref 0.0–0.5)
EOS%: 1.8 % (ref 0.0–7.0)
HEMATOCRIT: 33.9 % — AB (ref 34.8–46.6)
HEMOGLOBIN: 10.8 g/dL — AB (ref 11.6–15.9)
LYMPH#: 1.2 10*3/uL (ref 0.9–3.3)
LYMPH%: 10.3 % — ABNORMAL LOW (ref 14.0–49.7)
MCH: 31 pg (ref 25.1–34.0)
MCHC: 31.9 g/dL (ref 31.5–36.0)
MCV: 97.2 fL (ref 79.5–101.0)
MONO#: 2.1 10*3/uL — ABNORMAL HIGH (ref 0.1–0.9)
MONO%: 17.9 % — ABNORMAL HIGH (ref 0.0–14.0)
NEUT%: 69.4 % (ref 38.4–76.8)
NEUTROS ABS: 8.1 10*3/uL — AB (ref 1.5–6.5)
Platelets: 288 10*3/uL (ref 145–400)
RBC: 3.49 10*6/uL — ABNORMAL LOW (ref 3.70–5.45)
RDW: 15 % — AB (ref 11.2–14.5)
WBC: 11.7 10*3/uL — AB (ref 3.9–10.3)

## 2013-12-20 LAB — COMPREHENSIVE METABOLIC PANEL (CC13)
ALT: 25 U/L (ref 0–55)
ANION GAP: 7 meq/L (ref 3–11)
AST: 39 U/L — ABNORMAL HIGH (ref 5–34)
Albumin: 3.1 g/dL — ABNORMAL LOW (ref 3.5–5.0)
Alkaline Phosphatase: 146 U/L (ref 40–150)
BUN: 14.9 mg/dL (ref 7.0–26.0)
CALCIUM: 9.8 mg/dL (ref 8.4–10.4)
CHLORIDE: 106 meq/L (ref 98–109)
CO2: 26 mEq/L (ref 22–29)
CREATININE: 1.1 mg/dL (ref 0.6–1.1)
Glucose: 113 mg/dl (ref 70–140)
POTASSIUM: 4 meq/L (ref 3.5–5.1)
Sodium: 139 mEq/L (ref 136–145)
Total Bilirubin: 0.29 mg/dL (ref 0.20–1.20)
Total Protein: 7.1 g/dL (ref 6.4–8.3)

## 2013-12-20 MED ORDER — METHYLPREDNISOLONE 4 MG PO KIT: PACK | ORAL | Status: DC

## 2013-12-20 NOTE — Telephone Encounter (Signed)
Per staff message and POF I have scheduled appts. Advised scheduler of appts. JMW  

## 2013-12-20 NOTE — Progress Notes (Addendum)
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  2) locally advanced adenocarcinoma of the stomach status post resection in July of 2002 followed by concurrent chemoradiation under the care of Dr. Lisbeth Renshaw and Dr. Jamse Arn.  PRIOR THERAPY: 1) status post wedge resection of the right upper lobe under the care of Dr. Servando Snare on 09/16/2011 and the final pathology was consistent with a spindle cell carcinoma. 2) status post curative radiotherapy to the recurrent disease in the right lung under the care of Dr. Tammi Klippel completed on 01/10/2013. 3) status post stereotactic radiotherapy to the metastatic deposits in the lung completed 06/03/2013 under the care of Dr. Tammi Klippel.  CURRENT THERAPY: Systemic chemotherapy with carboplatin for AUC of 5 and paclitaxel 175 mg/M2 every 3 weeks with Neulasta support. First dose 11/30/2013.  INTERVAL HISTORY: [name] 80 y.o. female returns to the clinic today for followup visit. She tolerated her first cycle of chemotherapy relatively well. She continues to have mild soreness in the right chest area but states that this is getting better. She states her brother recently died. Her appetite has been decreased. She would like something to help stimulate her appetite. She also lost few more pounds recently.  She denied having any cough or hemoptysis. She has no nausea or vomiting, she has no fever, chills or night sweats.   MEDICAL HISTORY: Past Medical History  Diagnosis Date  . Dyslipidemia   . Arthritis     Hx of osteoarthritis  . Shortness of breath     WITH EXERTION   . COPD (chronic obstructive pulmonary disease)   . Anemia   . History of chemotherapy    carboplatin/taxol  . Lung mass   . 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 History of radiation therapy 01/13/11-02/20/11    abdomen    ALLERGIES:  has No Known Allergies.  MEDICATIONS:  Current Outpatient Prescriptions  Medication Sig Dispense Refill  . albuterol (PROVENTIL HFA;VENTOLIN HFA) 108 (90 BASE) MCG/ACT inhaler Inhale 3 puffs into the lungs every 4 (four) hours as needed. For shortness of breath       . calcium-vitamin D (OSCAL WITH D) 500-200 MG-UNIT per tablet Take 1 tablet by mouth every other day.       . cholecalciferol (VITAMIN D) 1000 UNITS tablet Take 1,000 Units by mouth daily.      . clobetasol cream (TEMOVATE) 0.05 %       . Cyanocobalamin (B-12) 100 MCG TABS Take 1 tablet by mouth as needed.       . Ferrous Sulfate (IRON) 325 (65 FE) MG TABS Take 1 tablet by mouth daily.       . fish oil-omega-3 fatty acids 1000 MG capsule Take 1 g by mouth daily.       . hydrOXYzine (ATARAX) 10 MG tablet Take 10 mg by mouth daily as needed. For itching.      . Multiple Vitamins-Minerals (CENTRUM SILVER PO) Take 1 tablet by mouth daily.       . Multiple Vitamins-Minerals (ICAPS PO) Take 1 tablet by mouth every other day.       . prochlorperazine (COMPAZINE) 10 MG tablet Take 1 tablet (10 mg total) by mouth every 6 (six) hours as needed  for nausea or vomiting.  30 tablet  0   No current facility-administered medications for this visit.    SURGICAL HISTORY:  Past Surgical History  Procedure Laterality Date  . Esophagogastrectomy, j tube 11/08/2010    . Cholecystectomy    . Abdominal hysterectomy    . Cholecystectomy    . Cataract extraction      RIGHT EYE   . Port-a-cath removal  05/13/2011    Procedure: REMOVAL PORT-A-CATH;  Surgeon: Stark Klein, MD;  Location: [facility];  Service: General;  Laterality: N/A;  Removal port-a-cath.  . Portacath placement    . Eye surgery    . Bronchoscopy  08/28/11    rt upper lung mass bx  . Cardia resected  11/2010  . Wedge resection  09/18/11    right  vats Dr. Lanelle Bal    REVIEW OF SYSTEMS:  Constitutional: positive for anorexia and weight loss Eyes: negative Ears, nose, mouth, throat, and face: negative Respiratory: negative Cardiovascular: negative Gastrointestinal: negative Genitourinary:negative Integument/breast: negative Hematologic/lymphatic: negative Musculoskeletal:positive for right anterior chest soreness Neurological: negative Behavioral/Psych: negative Endocrine: negative Allergic/Immunologic: negative   PHYSICAL EXAMINATION: General appearance: alert, cooperative and no distress Head: Normocephalic, without obvious abnormality, atraumatic Neck: no adenopathy, no JVD, supple, symmetrical, trachea midline and thyroid not enlarged, symmetric, no tenderness/mass/nodules Lymph nodes: Cervical, supraclavicular, and axillary nodes normal. Resp: clear to auscultation bilaterally Back: symmetric, no curvature. ROM normal. No CVA tenderness. Cardio: regular rate and rhythm, S1, S2 normal, no murmur, click, rub or gallop GI: soft, non-tender; bowel sounds normal; no masses,  no organomegaly Extremities: extremities normal, atraumatic, no cyanosis or edema Neurologic: Alert and oriented X 3, normal strength and tone. Normal symmetric reflexes. Normal coordination and gait  ECOG PERFORMANCE STATUS: 1 - Symptomatic but completely ambulatory  Blood pressure 125/78, pulse 93, temperature 97.8 F (36.6 C), temperature source Oral, resp. rate 18, height 4\' 11"  (1.499 m), weight 89 lb 1.6 oz (40.415 kg), SpO2 93.00%.  LABORATORY DATA: Lab Results  Component Value Date   WBC 11.7* 12/20/2013   HGB 10.8* 12/20/2013   HCT 33.9* 12/20/2013   MCV 97.2 12/20/2013   PLT 288 12/20/2013      Chemistry      Component Value Date/Time   NA 139 12/20/2013 1238   NA 140 09/17/2011 0435   NA 143 07/23/2011 0916   K 4.0 12/20/2013 1238   K 3.6 09/17/2011 0435   K 3.8 07/23/2011 0916   CL 108 09/17/2011 0435   CL 104 07/23/2011 0916    CO2 26 12/20/2013 1238   CO2 26 09/17/2011 0435   CO2 23 07/23/2011 0916   BUN 14.9 12/20/2013 1238   BUN 5* 09/17/2011 0435   BUN 7 07/23/2011 0916   CREATININE 1.1 12/20/2013 1238   CREATININE 0.57 09/17/2011 0435   CREATININE 0.8 07/23/2011 0916      Component Value Date/Time   CALCIUM 9.8 12/20/2013 1238   CALCIUM 9.0 09/17/2011 0435   CALCIUM 8.9 07/23/2011 0916   ALKPHOS 146 12/20/2013 1238   ALKPHOS 100 09/17/2011 0435   ALKPHOS 109* 07/23/2011 0916   AST 39* 12/20/2013 1238   AST 46* 09/17/2011 0435   AST 31 07/23/2011 0916   ALT 25 12/20/2013 1238   ALT 37* 09/17/2011 0435   ALT 22 07/23/2011 0916   BILITOT 0.29 12/20/2013 1238   BILITOT 0.5 09/17/2011 0435   BILITOT 0.60 07/23/2011 0916       RADIOGRAPHIC STUDIES: Ct Chest W Contrast  11/18/2013  CLINICAL DATA:  Followup gastric carcinoma and lung carcinoma. Completed radiation therapy and chemotherapy. Right-sided chest pain. Restaging.  EXAM: CT CHEST, ABDOMEN, AND PELVIS WITH CONTRAST  TECHNIQUE: Multidetector CT imaging of the chest, abdomen and pelvis was performed following the standard protocol during bolus administration of intravenous contrast.  CONTRAST:  65mL OMNIPAQUE IOHEXOL 300 MG/ML  SOLN  COMPARISON:  Chest CT on 08/15/2013, and PET-CT on 10/27/2012  FINDINGS:   CT CHEST FINDINGS  New small right pleural effusion is seen. Increased pleural and subpleural nodularity is seen in the peripheral right mid and lower lung fields. Two dominant enhancing masslike opacities in the peripheral right lung field are new, and measure 1.4 x 1.9 cm on image 29 and 2.0 x 2.9 cm on image 34. This is consistent with worsening metastatic disease.  Mild mediastinal lymphadenopathy in the precarinal and AP window regions is stable with largest lymph node measuring 11 mm on image 24. No new or increased lymphadenopathy identified within the thorax. Moderate emphysema again demonstrated. Several nondisplaced right lateral rib fractures are again seen  which show early healing. No lytic or sclerotic bone lesions identified.    CT ABDOMEN AND PELVIS FINDINGS  The liver and adrenal glands are normal in appearance. Pancreas, spleen, and kidneys are also normal in appearance. No evidence of hydronephrosis.  Gastric surgical staples and mild diffuse gastric wall thickening are stable and chronic. No soft tissue masses or lymphadenopathy identified within the abdomen or pelvis. Prior hysterectomy noted. Adnexal regions are unremarkable in appearance.  No evidence of inflammatory process or abnormal fluid collections. No evidence of bowel obstruction. Large stool burden seen throughout the majority of the colon. No suspicious bone lesions identified.    IMPRESSION: Increased pleural and subpleural metastatic disease in the lateral right hemithorax, with small right pleural effusion.  Stable mild mediastinal lymphadenopathy.  Stable nondisplaced right lateral rib fractures. No lytic or sclerotic bone metastases identified.  No evidence of abdominal or pelvic metastatic disease.  Large stool burden noted; suggest clinical correlation for possible constipation.   Electronically Signed   By: Earle Gell M.D.   On: 11/18/2013 15:13   ASSESSMENT AND PLAN: This is a very pleasant 80 years old [place] American female with recurrent spindle cell carcinoma of the right lung consistent with sarcomatoid carcinoma, status post curative radiotherapy completed on 01/10/2013.  She also underwent a course of stereotactic radiotherapy to the pleural/subpleural area under the care of Dr. Tammi Klippel. Her recent scan showed evidence for disease progression with increased pleural and subpleural metastatic disease in the lateral right hemithorax. I discussed the scan results and showed the images to the patient today. She is currently asymptomatic from her disease except for with pain on the right side of the chest. She is currently being treated with carboplatin for AUC of 5 and  paclitaxel 175 mg/M2 every 3 weeks with Neulasta support, status post 1 cycle. The patient was discussed with and also seen by Dr. Julien Nordmann. She will proceed with cycle #2 of her chemotherapy  As scheduled. For appetite stimulation, a prescription for a Medrol dose pak was sent to her pharmacy of record via [facility]. She will continue with weekly labs and follow up in 3 weeks prior to cycle #2. Her. She was advised to call immediately if she has any concerning symptoms in the interval.  The patient voices understanding of current disease status and treatment options and is in agreement with the current care plan.  All questions were answered. The  patient knows to call the clinic with any problems, questions or concerns. We can certainly see the patient much sooner if necessary.  Carlton Adam PA-C  ADDENDUM: Hematology/Oncology Attending: I had a face to face encounter with the patient. I recommended her care plan. This is a very pleasant 80 years old American female with recurrent squamous cell carcinoma. She was started on treatment with and used with carboplatin and paclitaxel is status post 1 cycle and tolerated her treatment well. She denied having any significant fever or chills, no nausea or vomiting. I recommended for her to proceed with cycle #2 today as scheduled. She would come back for followup visit in 3 weeks with the start of cycle #3. She was advised to call immediately if she has any concerning symptoms in the interval.  Disclaimer: This note was dictated with voice recognition software. Similar sounding words can inadvertently be transcribed and may not be corrected upon review. Eilleen Kempf., MD 12/24/2013

## 2013-12-20 NOTE — Telephone Encounter (Signed)
Pt confirmed labs/ov per 08/18 POF, sent msg to add chemo, gave pt AVS..Marland KitchenKJ

## 2013-12-21 ENCOUNTER — Other Ambulatory Visit: Payer: Medicare Other

## 2013-12-21 ENCOUNTER — Ambulatory Visit (HOSPITAL_BASED_OUTPATIENT_CLINIC_OR_DEPARTMENT_OTHER): Payer: Medicare Other

## 2013-12-21 VITALS — BP 124/88 | HR 112 | Temp 98.1°F | Resp 18

## 2013-12-21 DIAGNOSIS — C801 Malignant (primary) neoplasm, unspecified: Secondary | ICD-10-CM

## 2013-12-21 DIAGNOSIS — C782 Secondary malignant neoplasm of pleura: Secondary | ICD-10-CM

## 2013-12-21 DIAGNOSIS — C3410 Malignant neoplasm of upper lobe, unspecified bronchus or lung: Secondary | ICD-10-CM

## 2013-12-21 DIAGNOSIS — Z5111 Encounter for antineoplastic chemotherapy: Secondary | ICD-10-CM

## 2013-12-21 MED ORDER — SODIUM CHLORIDE 0.9 % IV SOLN
264.0000 mg | Freq: Once | INTRAVENOUS | Status: AC
  Administered 2013-12-21: 260 mg via INTRAVENOUS
  Filled 2013-12-21: qty 26

## 2013-12-21 MED ORDER — DEXAMETHASONE SODIUM PHOSPHATE 20 MG/5ML IJ SOLN
INTRAMUSCULAR | Status: AC
  Filled 2013-12-21: qty 5

## 2013-12-21 MED ORDER — DEXAMETHASONE SODIUM PHOSPHATE 20 MG/5ML IJ SOLN
20.0000 mg | Freq: Once | INTRAMUSCULAR | Status: AC
  Administered 2013-12-21: 20 mg via INTRAVENOUS

## 2013-12-21 MED ORDER — ONDANSETRON 16 MG/50ML IVPB (CHCC)
INTRAVENOUS | Status: AC
  Filled 2013-12-21: qty 16

## 2013-12-21 MED ORDER — FAMOTIDINE IN NACL 20-0.9 MG/50ML-% IV SOLN
20.0000 mg | Freq: Once | INTRAVENOUS | Status: AC
  Administered 2013-12-21: 20 mg via INTRAVENOUS

## 2013-12-21 MED ORDER — DIPHENHYDRAMINE HCL 50 MG/ML IJ SOLN
INTRAMUSCULAR | Status: AC
  Filled 2013-12-21: qty 1

## 2013-12-21 MED ORDER — SODIUM CHLORIDE 0.9 % IV SOLN
Freq: Once | INTRAVENOUS | Status: AC
  Administered 2013-12-21: 09:00:00 via INTRAVENOUS

## 2013-12-21 MED ORDER — FAMOTIDINE IN NACL 20-0.9 MG/50ML-% IV SOLN
INTRAVENOUS | Status: AC
  Filled 2013-12-21: qty 50

## 2013-12-21 MED ORDER — PACLITAXEL CHEMO INJECTION 300 MG/50ML
175.0000 mg/m2 | Freq: Once | INTRAVENOUS | Status: AC
  Administered 2013-12-21: 234 mg via INTRAVENOUS
  Filled 2013-12-21: qty 39

## 2013-12-21 MED ORDER — ONDANSETRON 16 MG/50ML IVPB (CHCC)
16.0000 mg | Freq: Once | INTRAVENOUS | Status: AC
  Administered 2013-12-21: 16 mg via INTRAVENOUS

## 2013-12-21 MED ORDER — DIPHENHYDRAMINE HCL 50 MG/ML IJ SOLN
50.0000 mg | Freq: Once | INTRAMUSCULAR | Status: AC
  Administered 2013-12-21: 50 mg via INTRAVENOUS

## 2013-12-21 NOTE — Patient Instructions (Signed)
[facility] Discharge Instructions for Patients Receiving Chemotherapy  Today you received the following chemotherapy agents: taxol, Carboplatin  To help prevent nausea and vomiting after your treatment, we encourage you to take your nausea medication as prescribed.    If you develop nausea and vomiting that is not controlled by your nausea medication, call the clinic.   BELOW ARE SYMPTOMS THAT SHOULD BE REPORTED IMMEDIATELY:  *FEVER GREATER THAN 100.5 F  *CHILLS WITH OR WITHOUT FEVER  NAUSEA AND VOMITING THAT IS NOT CONTROLLED WITH YOUR NAUSEA MEDICATION  *UNUSUAL SHORTNESS OF BREATH  *UNUSUAL BRUISING OR BLEEDING  TENDERNESS IN MOUTH AND THROAT WITH OR WITHOUT PRESENCE OF ULCERS  *URINARY PROBLEMS  *BOWEL PROBLEMS  UNUSUAL RASH Items with * indicate a potential emergency and should be followed up as soon as possible.  Feel free to call the clinic you have any questions or concerns. The clinic phone number is [phone].

## 2013-12-22 ENCOUNTER — Ambulatory Visit (HOSPITAL_BASED_OUTPATIENT_CLINIC_OR_DEPARTMENT_OTHER): Payer: Medicare Other

## 2013-12-22 VITALS — BP 114/76 | HR 103 | Temp 97.9°F

## 2013-12-22 DIAGNOSIS — C801 Malignant (primary) neoplasm, unspecified: Secondary | ICD-10-CM

## 2013-12-22 DIAGNOSIS — C3410 Malignant neoplasm of upper lobe, unspecified bronchus or lung: Secondary | ICD-10-CM

## 2013-12-22 DIAGNOSIS — Z5189 Encounter for other specified aftercare: Secondary | ICD-10-CM

## 2013-12-22 MED ORDER — PEGFILGRASTIM INJECTION 6 MG/0.6ML
6.0000 mg | Freq: Once | SUBCUTANEOUS | Status: AC
  Administered 2013-12-22: 6 mg via SUBCUTANEOUS
  Filled 2013-12-22: qty 0.6

## 2013-12-23 NOTE — Patient Instructions (Addendum)
Take the Medrol dose pak as prescribed to stimulate your appetite Continue weekly labs as scheduled Follow up in 3 weeks, prior to your next scheduled cycle of chemotherapy

## 2013-12-28 ENCOUNTER — Telehealth: Payer: Self-pay | Admitting: *Deleted

## 2013-12-28 ENCOUNTER — Other Ambulatory Visit (HOSPITAL_BASED_OUTPATIENT_CLINIC_OR_DEPARTMENT_OTHER): Payer: Medicare Other

## 2013-12-28 DIAGNOSIS — C3491 Malignant neoplasm of unspecified part of right bronchus or lung: Secondary | ICD-10-CM

## 2013-12-28 DIAGNOSIS — C3410 Malignant neoplasm of upper lobe, unspecified bronchus or lung: Secondary | ICD-10-CM

## 2013-12-28 LAB — COMPREHENSIVE METABOLIC PANEL (CC13)
ALK PHOS: 156 U/L — AB (ref 40–150)
ALT: 23 U/L (ref 0–55)
AST: 16 U/L (ref 5–34)
Albumin: 3.1 g/dL — ABNORMAL LOW (ref 3.5–5.0)
Anion Gap: 9 mEq/L (ref 3–11)
BUN: 21.9 mg/dL (ref 7.0–26.0)
CALCIUM: 9.8 mg/dL (ref 8.4–10.4)
CO2: 24 mEq/L (ref 22–29)
Chloride: 108 mEq/L (ref 98–109)
Creatinine: 0.8 mg/dL (ref 0.6–1.1)
GLUCOSE: 91 mg/dL (ref 70–140)
Potassium: 3.5 mEq/L (ref 3.5–5.1)
Sodium: 141 mEq/L (ref 136–145)
Total Bilirubin: 0.61 mg/dL (ref 0.20–1.20)
Total Protein: 7 g/dL (ref 6.4–8.3)

## 2013-12-28 LAB — CBC WITH DIFFERENTIAL/PLATELET
BASO%: 0.3 % (ref 0.0–2.0)
Basophils Absolute: 0 10*3/uL (ref 0.0–0.1)
EOS ABS: 0.3 10*3/uL (ref 0.0–0.5)
EOS%: 4.3 % (ref 0.0–7.0)
HCT: 32 % — ABNORMAL LOW (ref 34.8–46.6)
HGB: 10.4 g/dL — ABNORMAL LOW (ref 11.6–15.9)
LYMPH%: 16 % (ref 14.0–49.7)
MCH: 31 pg (ref 25.1–34.0)
MCHC: 32.5 g/dL (ref 31.5–36.0)
MCV: 95.2 fL (ref 79.5–101.0)
MONO#: 1.2 10*3/uL — ABNORMAL HIGH (ref 0.1–0.9)
MONO%: 19.1 % — AB (ref 0.0–14.0)
NEUT%: 60.3 % (ref 38.4–76.8)
NEUTROS ABS: 3.9 10*3/uL (ref 1.5–6.5)
NRBC: 0 % (ref 0–0)
Platelets: 152 10*3/uL (ref 145–400)
RBC: 3.36 10*6/uL — AB (ref 3.70–5.45)
RDW: 14.1 % (ref 11.2–14.5)
WBC: 6.5 10*3/uL (ref 3.9–10.3)
lymph#: 1 10*3/uL (ref 0.9–3.3)

## 2013-12-28 NOTE — Telephone Encounter (Signed)
[name] called awaiting return call from office.  Reports she has cold hands with numbness and mouth is numb.  "This started more than a week ago and is driving me crazy.  I have cramps in my toes and both hands my thumb, index and middle fingers too.  I can't button clothes or tie shoes.  Hands are slightly puffy, no redness.  I also don't have an appetite and can't eat.  Uses [facility] on [address].  Can be reached at [phone].  Will notify providers.  Received Cycle 2 taxol, Carbo on 12-21-2013.  Next f/u and treatment due 01-11-2014

## 2013-12-30 ENCOUNTER — Telehealth: Payer: Self-pay | Admitting: Medical Oncology

## 2013-12-30 DIAGNOSIS — R200 Anesthesia of skin: Secondary | ICD-10-CM

## 2013-12-30 MED ORDER — GABAPENTIN 100 MG PO CAPS: 100.0000 mg | ORAL_CAPSULE | Freq: Every day | ORAL | Status: DC

## 2013-12-30 NOTE — Telephone Encounter (Signed)
Pt said she does not have methylpred bottle . I instructed pt to take all med bottles to pharmacy to sort this out.

## 2013-12-30 NOTE — Telephone Encounter (Signed)
reports she has numbness in her toes and fingertips and having trouble buttoning blouse "it takes time". Her hands feel cold. Per Julien Nordmann he auth neurontin. Rx sent.

## 2014-01-04 ENCOUNTER — Other Ambulatory Visit (HOSPITAL_BASED_OUTPATIENT_CLINIC_OR_DEPARTMENT_OTHER): Payer: Medicare Other

## 2014-01-04 DIAGNOSIS — C3410 Malignant neoplasm of upper lobe, unspecified bronchus or lung: Secondary | ICD-10-CM

## 2014-01-04 DIAGNOSIS — C3491 Malignant neoplasm of unspecified part of right bronchus or lung: Secondary | ICD-10-CM

## 2014-01-04 LAB — CBC WITH DIFFERENTIAL/PLATELET
BASO%: 0.2 % (ref 0.0–2.0)
Basophils Absolute: 0 10*3/uL (ref 0.0–0.1)
EOS ABS: 0.2 10*3/uL (ref 0.0–0.5)
EOS%: 1 % (ref 0.0–7.0)
HCT: 33.2 % — ABNORMAL LOW (ref 34.8–46.6)
HGB: 10.8 g/dL — ABNORMAL LOW (ref 11.6–15.9)
LYMPH%: 8.4 % — ABNORMAL LOW (ref 14.0–49.7)
MCH: 31.3 pg (ref 25.1–34.0)
MCHC: 32.5 g/dL (ref 31.5–36.0)
MCV: 96.2 fL (ref 79.5–101.0)
MONO#: 1.6 10*3/uL — AB (ref 0.1–0.9)
MONO%: 8.9 % (ref 0.0–14.0)
NEUT%: 81.5 % — ABNORMAL HIGH (ref 38.4–76.8)
NEUTROS ABS: 14.8 10*3/uL — AB (ref 1.5–6.5)
PLATELETS: 201 10*3/uL (ref 145–400)
RBC: 3.45 10*6/uL — AB (ref 3.70–5.45)
RDW: 14.8 % — AB (ref 11.2–14.5)
WBC: 18.2 10*3/uL — AB (ref 3.9–10.3)
lymph#: 1.5 10*3/uL (ref 0.9–3.3)
nRBC: 0 % (ref 0–0)

## 2014-01-04 LAB — COMPREHENSIVE METABOLIC PANEL (CC13)
ALT: 20 U/L (ref 0–55)
ANION GAP: 8 meq/L (ref 3–11)
AST: 23 U/L (ref 5–34)
Albumin: 3.2 g/dL — ABNORMAL LOW (ref 3.5–5.0)
Alkaline Phosphatase: 169 U/L — ABNORMAL HIGH (ref 40–150)
BUN: 12.3 mg/dL (ref 7.0–26.0)
CALCIUM: 9.6 mg/dL (ref 8.4–10.4)
CHLORIDE: 109 meq/L (ref 98–109)
CO2: 25 mEq/L (ref 22–29)
Creatinine: 0.8 mg/dL (ref 0.6–1.1)
Glucose: 98 mg/dl (ref 70–140)
Potassium: 4.6 mEq/L (ref 3.5–5.1)
Sodium: 142 mEq/L (ref 136–145)
TOTAL PROTEIN: 7 g/dL (ref 6.4–8.3)
Total Bilirubin: 0.2 mg/dL (ref 0.20–1.20)

## 2014-01-11 ENCOUNTER — Ambulatory Visit (HOSPITAL_BASED_OUTPATIENT_CLINIC_OR_DEPARTMENT_OTHER): Payer: Medicare Other | Admitting: Internal Medicine

## 2014-01-11 ENCOUNTER — Encounter: Payer: Self-pay | Admitting: Internal Medicine

## 2014-01-11 ENCOUNTER — Telehealth: Payer: Self-pay | Admitting: Dietician

## 2014-01-11 ENCOUNTER — Other Ambulatory Visit (HOSPITAL_BASED_OUTPATIENT_CLINIC_OR_DEPARTMENT_OTHER): Payer: Medicare Other

## 2014-01-11 ENCOUNTER — Telehealth: Payer: Self-pay | Admitting: Internal Medicine

## 2014-01-11 ENCOUNTER — Ambulatory Visit (HOSPITAL_BASED_OUTPATIENT_CLINIC_OR_DEPARTMENT_OTHER): Payer: Medicare Other

## 2014-01-11 VITALS — BP 117/58 | HR 93 | Temp 98.9°F | Resp 18 | Ht 59.0 in | Wt 87.7 lb

## 2014-01-11 DIAGNOSIS — C782 Secondary malignant neoplasm of pleura: Secondary | ICD-10-CM

## 2014-01-11 DIAGNOSIS — C3491 Malignant neoplasm of unspecified part of right bronchus or lung: Secondary | ICD-10-CM

## 2014-01-11 DIAGNOSIS — C801 Malignant (primary) neoplasm, unspecified: Secondary | ICD-10-CM

## 2014-01-11 DIAGNOSIS — Z5111 Encounter for antineoplastic chemotherapy: Secondary | ICD-10-CM

## 2014-01-11 DIAGNOSIS — Z23 Encounter for immunization: Secondary | ICD-10-CM

## 2014-01-11 DIAGNOSIS — C3490 Malignant neoplasm of unspecified part of unspecified bronchus or lung: Secondary | ICD-10-CM

## 2014-01-11 DIAGNOSIS — C3410 Malignant neoplasm of upper lobe, unspecified bronchus or lung: Secondary | ICD-10-CM

## 2014-01-11 LAB — COMPREHENSIVE METABOLIC PANEL (CC13)
ALK PHOS: 118 U/L (ref 40–150)
ALT: 9 U/L (ref 0–55)
AST: 17 U/L (ref 5–34)
Albumin: 2.8 g/dL — ABNORMAL LOW (ref 3.5–5.0)
Anion Gap: 10 mEq/L (ref 3–11)
BUN: 12 mg/dL (ref 7.0–26.0)
CALCIUM: 9.2 mg/dL (ref 8.4–10.4)
CHLORIDE: 111 meq/L — AB (ref 98–109)
CO2: 21 mEq/L — ABNORMAL LOW (ref 22–29)
CREATININE: 0.8 mg/dL (ref 0.6–1.1)
Glucose: 129 mg/dl (ref 70–140)
Potassium: 3.6 mEq/L (ref 3.5–5.1)
Sodium: 142 mEq/L (ref 136–145)
Total Bilirubin: 0.26 mg/dL (ref 0.20–1.20)
Total Protein: 6.6 g/dL (ref 6.4–8.3)

## 2014-01-11 LAB — CBC WITH DIFFERENTIAL/PLATELET
BASO%: 1.4 % (ref 0.0–2.0)
Basophils Absolute: 0.1 10*3/uL (ref 0.0–0.1)
EOS%: 1.2 % (ref 0.0–7.0)
Eosinophils Absolute: 0.1 10*3/uL (ref 0.0–0.5)
HCT: 33.6 % — ABNORMAL LOW (ref 34.8–46.6)
HGB: 10.5 g/dL — ABNORMAL LOW (ref 11.6–15.9)
LYMPH#: 1.1 10*3/uL (ref 0.9–3.3)
LYMPH%: 14.5 % (ref 14.0–49.7)
MCH: 31.1 pg (ref 25.1–34.0)
MCHC: 31.2 g/dL — ABNORMAL LOW (ref 31.5–36.0)
MCV: 99.6 fL (ref 79.5–101.0)
MONO#: 0.8 10*3/uL (ref 0.1–0.9)
MONO%: 10.5 % (ref 0.0–14.0)
NEUT#: 5.3 10*3/uL (ref 1.5–6.5)
NEUT%: 72.4 % (ref 38.4–76.8)
Platelets: 249 10*3/uL (ref 145–400)
RBC: 3.38 10*6/uL — ABNORMAL LOW (ref 3.70–5.45)
RDW: 14.9 % — AB (ref 11.2–14.5)
WBC: 7.3 10*3/uL (ref 3.9–10.3)

## 2014-01-11 MED ORDER — FAMOTIDINE IN NACL 20-0.9 MG/50ML-% IV SOLN
INTRAVENOUS | Status: AC
  Filled 2014-01-11: qty 50

## 2014-01-11 MED ORDER — SODIUM CHLORIDE 0.9 % IV SOLN
Freq: Once | INTRAVENOUS | Status: AC
  Administered 2014-01-11: 10:00:00 via INTRAVENOUS

## 2014-01-11 MED ORDER — INFLUENZA VAC SPLIT QUAD 0.5 ML IM SUSY
0.5000 mL | PREFILLED_SYRINGE | INTRAMUSCULAR | Status: AC
  Administered 2014-01-11: 0.5 mL via INTRAMUSCULAR
  Filled 2014-01-11: qty 0.5

## 2014-01-11 MED ORDER — ONDANSETRON 16 MG/50ML IVPB (CHCC)
16.0000 mg | Freq: Once | INTRAVENOUS | Status: AC
  Administered 2014-01-11: 16 mg via INTRAVENOUS

## 2014-01-11 MED ORDER — DIPHENHYDRAMINE HCL 50 MG/ML IJ SOLN
INTRAMUSCULAR | Status: AC
  Filled 2014-01-11: qty 1

## 2014-01-11 MED ORDER — DEXAMETHASONE SODIUM PHOSPHATE 20 MG/5ML IJ SOLN
20.0000 mg | Freq: Once | INTRAMUSCULAR | Status: AC
  Administered 2014-01-11: 20 mg via INTRAVENOUS

## 2014-01-11 MED ORDER — SODIUM CHLORIDE 0.9 % IV SOLN
277.5000 mg | Freq: Once | INTRAVENOUS | Status: AC
  Administered 2014-01-11: 280 mg via INTRAVENOUS
  Filled 2014-01-11: qty 28

## 2014-01-11 MED ORDER — PACLITAXEL CHEMO INJECTION 300 MG/50ML
175.0000 mg/m2 | Freq: Once | INTRAVENOUS | Status: AC
  Administered 2014-01-11: 234 mg via INTRAVENOUS
  Filled 2014-01-11: qty 39

## 2014-01-11 MED ORDER — FAMOTIDINE IN NACL 20-0.9 MG/50ML-% IV SOLN
20.0000 mg | Freq: Once | INTRAVENOUS | Status: AC
  Administered 2014-01-11: 20 mg via INTRAVENOUS

## 2014-01-11 MED ORDER — ONDANSETRON 16 MG/50ML IVPB (CHCC)
INTRAVENOUS | Status: AC
  Filled 2014-01-11: qty 16

## 2014-01-11 MED ORDER — INFLUENZA VAC SPLIT QUAD 0.5 ML IM SUSY
0.5000 mL | PREFILLED_SYRINGE | Freq: Once | INTRAMUSCULAR | Status: DC
  Filled 2014-01-11: qty 0.5

## 2014-01-11 MED ORDER — DIPHENHYDRAMINE HCL 50 MG/ML IJ SOLN
50.0000 mg | Freq: Once | INTRAMUSCULAR | Status: AC
  Administered 2014-01-11: 50 mg via INTRAVENOUS

## 2014-01-11 NOTE — Patient Instructions (Signed)
[facility] Discharge Instructions for Patients Receiving Chemotherapy  Today you received the following chemotherapy agents Taxol, Carboplatin  To help prevent nausea and vomiting after your treatment, we encourage you to take your nausea medication as directed.   If you develop nausea and vomiting that is not controlled by your nausea medication, call the clinic.   BELOW ARE SYMPTOMS THAT SHOULD BE REPORTED IMMEDIATELY:  *FEVER GREATER THAN 100.5 F  *CHILLS WITH OR WITHOUT FEVER  NAUSEA AND VOMITING THAT IS NOT CONTROLLED WITH YOUR NAUSEA MEDICATION  *UNUSUAL SHORTNESS OF BREATH  *UNUSUAL BRUISING OR BLEEDING  TENDERNESS IN MOUTH AND THROAT WITH OR WITHOUT PRESENCE OF ULCERS  *URINARY PROBLEMS  *BOWEL PROBLEMS  UNUSUAL RASH Items with * indicate a potential emergency and should be followed up as soon as possible.  Feel free to call the clinic you have any questions or concerns. The clinic phone number is [phone].

## 2014-01-11 NOTE — Progress Notes (Signed)
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  2) locally advanced adenocarcinoma of the stomach status post resection in July of 2002 followed by concurrent chemoradiation under the care of Dr. Lisbeth Renshaw and Dr. Jamse Arn.  PRIOR THERAPY: 1) status post wedge resection of the right upper lobe under the care of Dr. Servando Snare on 09/16/2011 and the final pathology was consistent with a spindle cell carcinoma. 2) status post curative radiotherapy to the recurrent disease in the right lung under the care of Dr. Tammi Klippel completed on 01/10/2013. 3) status post stereotactic radiotherapy to the metastatic deposits in the lung completed 06/03/2013 under the care of Dr. Tammi Klippel.  CURRENT THERAPY: Systemic chemotherapy with carboplatin for AUC of 5 and paclitaxel 175 mg/M2 every 3 weeks with Neulasta support. First dose 11/30/2013. Status post 2 cycles.  INTERVAL HISTORY: [name] 80 y.o. female returns to the clinic today for followup visit. The patient continues to have pain and tightness on the right side of her chest. She also lost few pounds recently but she is eating good.  She denied having any cough or hemoptysis. She has no nausea or vomiting, she has no fever, chills or night sweats. She is tolerating her treatment was carboplatin and paclitaxel fairly well. She is here today for cycle #3.  MEDICAL HISTORY: Past Medical History  Diagnosis Date  . Dyslipidemia   . Arthritis     Hx of osteoarthritis  . Shortness of breath     WITH EXERTION   . COPD (chronic obstructive pulmonary disease)   . Anemia   . History of chemotherapy     carboplatin/taxol  . Lung mass   . 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  History of radiation therapy 01/13/11-02/20/11    abdomen    ALLERGIES:  is allergic to atarax.  MEDICATIONS:  Current Outpatient Prescriptions  Medication Sig Dispense Refill  . calcium-vitamin D (OSCAL WITH D) 500-200 MG-UNIT per tablet Take 1 tablet by mouth every other day.       . cholecalciferol (VITAMIN D) 1000 UNITS tablet Take 1,000 Units by mouth daily.      . clobetasol cream (TEMOVATE) 0.05 %       . Cyanocobalamin (B-12) 100 MCG TABS Take 1 tablet by mouth as needed.       . Ferrous Sulfate (IRON) 325 (65 FE) MG TABS Take 1 tablet by mouth daily.       . fish oil-omega-3 fatty acids 1000 MG capsule Take 1 g by mouth daily.       Marland Kitchen gabapentin (NEURONTIN) 100 MG capsule Take 1 capsule (100 mg total) by mouth daily.  30 capsule  0  . Multiple Vitamins-Minerals (CENTRUM SILVER PO) Take 1 tablet by mouth daily.       . Multiple Vitamins-Minerals (ICAPS PO) Take 1 tablet by mouth every other day.       . prochlorperazine (COMPAZINE) 10 MG tablet Take 1 tablet (10 mg total) by mouth every 6 (six) hours as needed for nausea or vomiting.  30 tablet  0  . albuterol (PROVENTIL HFA;VENTOLIN HFA) 108 (90 BASE) MCG/ACT inhaler Inhale 3 puffs into the lungs every 4 (four) hours as needed. For shortness of breath  Current Facility-Administered Medications  Medication Dose Route Frequency Provider Last Rate Last Dose  . Influenza vac split quadrivalent PF (FLUARIX) injection 0.5 mL  0.5 mL Intramuscular Once Curt Bears, MD        SURGICAL HISTORY:  Past Surgical History  Procedure Laterality Date  . Esophagogastrectomy, j tube 11/08/2010    . Cholecystectomy    . Abdominal hysterectomy    . Cholecystectomy    . Cataract extraction      RIGHT EYE   . Port-a-cath removal  05/13/2011    Procedure: REMOVAL PORT-A-CATH;  Surgeon: Stark Klein, MD;  Location: [facility];  Service: General;  Laterality: N/A;  Removal port-a-cath.  . Portacath placement    . Eye surgery    . Bronchoscopy   08/28/11    rt upper lung mass bx  . Cardia resected  11/2010  . Wedge resection  09/18/11    right vats Dr. Lanelle Bal    REVIEW OF SYSTEMS:  Constitutional: negative Eyes: negative Ears, nose, mouth, throat, and face: negative Respiratory: negative Cardiovascular: negative Gastrointestinal: negative Genitourinary:negative Integument/breast: negative Hematologic/lymphatic: negative Musculoskeletal:negative Neurological: negative Behavioral/Psych: negative Endocrine: negative Allergic/Immunologic: negative   PHYSICAL EXAMINATION: General appearance: alert, cooperative and no distress Head: Normocephalic, without obvious abnormality, atraumatic Neck: no adenopathy, no JVD, supple, symmetrical, trachea midline and thyroid not enlarged, symmetric, no tenderness/mass/nodules Lymph nodes: Cervical, supraclavicular, and axillary nodes normal. Resp: clear to auscultation bilaterally Back: symmetric, no curvature. ROM normal. No CVA tenderness. Cardio: regular rate and rhythm, S1, S2 normal, no murmur, click, rub or gallop GI: soft, non-tender; bowel sounds normal; no masses,  no organomegaly Extremities: extremities normal, atraumatic, no cyanosis or edema Neurologic: Alert and oriented X 3, normal strength and tone. Normal symmetric reflexes. Normal coordination and gait  ECOG PERFORMANCE STATUS: 1 - Symptomatic but completely ambulatory  Blood pressure 117/58, pulse 93, temperature 98.9 F (37.2 C), temperature source Oral, resp. rate 18, height 4\' 11"  (1.499 m), weight 87 lb 11.2 oz (39.78 kg).  LABORATORY DATA: Lab Results  Component Value Date   WBC 7.3 01/11/2014   HGB 10.5* 01/11/2014   HCT 33.6* 01/11/2014   MCV 99.6 01/11/2014   PLT 249 01/11/2014      Chemistry      Component Value Date/Time   NA 142 01/11/2014 0757   NA 140 09/17/2011 0435   NA 143 07/23/2011 0916   K 3.6 01/11/2014 0757   K 3.6 09/17/2011 0435   K 3.8 07/23/2011 0916   CL 108 09/17/2011 0435   CL 104  07/23/2011 0916   CO2 21* 01/11/2014 0757   CO2 26 09/17/2011 0435   CO2 23 07/23/2011 0916   BUN 12.0 01/11/2014 0757   BUN 5* 09/17/2011 0435   BUN 7 07/23/2011 0916   CREATININE 0.8 01/11/2014 0757   CREATININE 0.57 09/17/2011 0435   CREATININE 0.8 07/23/2011 0916      Component Value Date/Time   CALCIUM 9.2 01/11/2014 0757   CALCIUM 9.0 09/17/2011 0435   CALCIUM 8.9 07/23/2011 0916   ALKPHOS 118 01/11/2014 0757   ALKPHOS 100 09/17/2011 0435   ALKPHOS 109* 07/23/2011 0916   AST 17 01/11/2014 0757   AST 46* 09/17/2011 0435   AST 31 07/23/2011 0916   ALT 9 01/11/2014 0757   ALT 37* 09/17/2011 0435   ALT 22 07/23/2011 0916   BILITOT 0.26 01/11/2014 0757   BILITOT 0.5 09/17/2011 0435   BILITOT 0.60 07/23/2011 0916       RADIOGRAPHIC STUDIES:  ASSESSMENT AND PLAN: This is a very pleasant 80 years old African American female with recurrent spindle cell carcinoma of the right lung consistent with sarcomatoid carcinoma, status post curative radiotherapy completed on 01/10/2013.  She also underwent a course of stereotactic radiotherapy to the pleural/subpleural area under the care of Dr. Tammi Klippel. Her recent scan showed evidence for disease progression with increased pleural and subpleural metastatic disease in the lateral right hemithorax. She is currently undergoing systemic chemotherapy with carboplatin and paclitaxel is status post 2 cycles and tolerating her treatment fairly well with no significant adverse effects. I recommended for the patient to proceed with cycle #3 today as scheduled. She would come back for followup visit in 3 weeks for evaluation after repeating CT scan of the chest, abdomen and pelvis for restaging of her disease. She was advised to call immediately if she has any concerning symptoms in the interval.  The patient voices understanding of current disease status and treatment options and is in agreement with the current care plan.  All questions were answered. The patient knows to call  the clinic with any problems, questions or concerns. We can certainly see the patient much sooner if necessary.  Disclaimer: This note was dictated with voice recognition software. Similar sounding words can inadvertently be transcribed and may not be corrected upon review.

## 2014-01-11 NOTE — Telephone Encounter (Signed)
Pt confirmed labs/ov per 09/009 POF, sent msg to add chemo, gave pt AVS....KJ

## 2014-01-11 NOTE — Telephone Encounter (Signed)
Brief Outpatient Oncology Nutrition Note  Patient has been identified to be at risk on malnutrition screen.  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  Currently undergoing chemo with carboplatin and paclitaxel.  Called patient due to weight loss.  Per chart patient is eating well but losing weight.  Patient was not available.  Contact information for the [place] [address] provided.  Antonieta Iba, RD, LDN

## 2014-01-12 ENCOUNTER — Telehealth: Payer: Self-pay | Admitting: Internal Medicine

## 2014-01-12 ENCOUNTER — Telehealth: Payer: Self-pay | Admitting: *Deleted

## 2014-01-12 ENCOUNTER — Ambulatory Visit (HOSPITAL_BASED_OUTPATIENT_CLINIC_OR_DEPARTMENT_OTHER): Payer: Medicare Other

## 2014-01-12 VITALS — BP 122/73 | HR 94 | Temp 98.0°F

## 2014-01-12 DIAGNOSIS — C3410 Malignant neoplasm of upper lobe, unspecified bronchus or lung: Secondary | ICD-10-CM

## 2014-01-12 DIAGNOSIS — C782 Secondary malignant neoplasm of pleura: Secondary | ICD-10-CM

## 2014-01-12 DIAGNOSIS — Z5189 Encounter for other specified aftercare: Secondary | ICD-10-CM

## 2014-01-12 DIAGNOSIS — C801 Malignant (primary) neoplasm, unspecified: Secondary | ICD-10-CM

## 2014-01-12 MED ORDER — PEGFILGRASTIM INJECTION 6 MG/0.6ML
6.0000 mg | Freq: Once | SUBCUTANEOUS | Status: AC
  Administered 2014-01-12: 6 mg via SUBCUTANEOUS
  Filled 2014-01-12: qty 0.6

## 2014-01-12 NOTE — Telephone Encounter (Signed)
gv adn printed appt sched adn avs for pt for Sept and OCT....emailed MW to adjust tx....Marland Kitchensed added tx.

## 2014-01-12 NOTE — Telephone Encounter (Signed)
Per staff message and POF I have scheduled appts. Advised scheduler of appts. JMW  

## 2014-01-18 ENCOUNTER — Other Ambulatory Visit (HOSPITAL_BASED_OUTPATIENT_CLINIC_OR_DEPARTMENT_OTHER): Payer: Medicare Other

## 2014-01-18 DIAGNOSIS — C782 Secondary malignant neoplasm of pleura: Secondary | ICD-10-CM

## 2014-01-18 DIAGNOSIS — C3410 Malignant neoplasm of upper lobe, unspecified bronchus or lung: Secondary | ICD-10-CM

## 2014-01-18 DIAGNOSIS — C3491 Malignant neoplasm of unspecified part of right bronchus or lung: Secondary | ICD-10-CM

## 2014-01-18 LAB — COMPREHENSIVE METABOLIC PANEL (CC13)
ALK PHOS: 146 U/L (ref 40–150)
ALT: 12 U/L (ref 0–55)
AST: 17 U/L (ref 5–34)
Albumin: 3.2 g/dL — ABNORMAL LOW (ref 3.5–5.0)
Anion Gap: 10 mEq/L (ref 3–11)
BILIRUBIN TOTAL: 0.39 mg/dL (ref 0.20–1.20)
BUN: 12.9 mg/dL (ref 7.0–26.0)
CO2: 23 mEq/L (ref 22–29)
CREATININE: 1 mg/dL (ref 0.6–1.1)
Calcium: 9.2 mg/dL (ref 8.4–10.4)
Chloride: 111 mEq/L — ABNORMAL HIGH (ref 98–109)
GLUCOSE: 85 mg/dL (ref 70–140)
Potassium: 3.9 mEq/L (ref 3.5–5.1)
Sodium: 143 mEq/L (ref 136–145)
Total Protein: 6.7 g/dL (ref 6.4–8.3)

## 2014-01-18 LAB — CBC WITH DIFFERENTIAL/PLATELET
BASO%: 1 % (ref 0.0–2.0)
BASOS ABS: 0 10*3/uL (ref 0.0–0.1)
EOS ABS: 0.4 10*3/uL (ref 0.0–0.5)
EOS%: 7.3 % — ABNORMAL HIGH (ref 0.0–7.0)
HCT: 31.3 % — ABNORMAL LOW (ref 34.8–46.6)
HGB: 10.1 g/dL — ABNORMAL LOW (ref 11.6–15.9)
LYMPH%: 11.5 % — AB (ref 14.0–49.7)
MCH: 31 pg (ref 25.1–34.0)
MCHC: 32.2 g/dL (ref 31.5–36.0)
MCV: 96.4 fL (ref 79.5–101.0)
MONO#: 0.7 10*3/uL (ref 0.1–0.9)
MONO%: 13.9 % (ref 0.0–14.0)
NEUT%: 66.3 % (ref 38.4–76.8)
NEUTROS ABS: 3.2 10*3/uL (ref 1.5–6.5)
PLATELETS: 106 10*3/uL — AB (ref 145–400)
RBC: 3.25 10*6/uL — ABNORMAL LOW (ref 3.70–5.45)
RDW: 15.2 % — ABNORMAL HIGH (ref 11.2–14.5)
WBC: 4.9 10*3/uL (ref 3.9–10.3)
lymph#: 0.6 10*3/uL — ABNORMAL LOW (ref 0.9–3.3)

## 2014-01-25 ENCOUNTER — Other Ambulatory Visit (HOSPITAL_BASED_OUTPATIENT_CLINIC_OR_DEPARTMENT_OTHER): Payer: Medicare Other

## 2014-01-25 DIAGNOSIS — C782 Secondary malignant neoplasm of pleura: Secondary | ICD-10-CM

## 2014-01-25 DIAGNOSIS — C3410 Malignant neoplasm of upper lobe, unspecified bronchus or lung: Secondary | ICD-10-CM

## 2014-01-25 DIAGNOSIS — C3491 Malignant neoplasm of unspecified part of right bronchus or lung: Secondary | ICD-10-CM

## 2014-01-25 LAB — CBC WITH DIFFERENTIAL/PLATELET
BASO%: 0.4 % (ref 0.0–2.0)
Basophils Absolute: 0.1 10*3/uL (ref 0.0–0.1)
EOS%: 1.7 % (ref 0.0–7.0)
Eosinophils Absolute: 0.3 10*3/uL (ref 0.0–0.5)
HEMATOCRIT: 32.4 % — AB (ref 34.8–46.6)
HGB: 10.4 g/dL — ABNORMAL LOW (ref 11.6–15.9)
LYMPH%: 9.1 % — AB (ref 14.0–49.7)
MCH: 30.9 pg (ref 25.1–34.0)
MCHC: 32.2 g/dL (ref 31.5–36.0)
MCV: 96 fL (ref 79.5–101.0)
MONO#: 1.2 10*3/uL — ABNORMAL HIGH (ref 0.1–0.9)
MONO%: 7.3 % (ref 0.0–14.0)
NEUT#: 13.2 10*3/uL — ABNORMAL HIGH (ref 1.5–6.5)
NEUT%: 81.5 % — ABNORMAL HIGH (ref 38.4–76.8)
Platelets: 207 10*3/uL (ref 145–400)
RBC: 3.38 10*6/uL — AB (ref 3.70–5.45)
RDW: 15.4 % — ABNORMAL HIGH (ref 11.2–14.5)
WBC: 16.3 10*3/uL — ABNORMAL HIGH (ref 3.9–10.3)
lymph#: 1.5 10*3/uL (ref 0.9–3.3)

## 2014-01-25 LAB — COMPREHENSIVE METABOLIC PANEL (CC13)
ALK PHOS: 180 U/L — AB (ref 40–150)
ALT: 11 U/L (ref 0–55)
AST: 24 U/L (ref 5–34)
Albumin: 3.3 g/dL — ABNORMAL LOW (ref 3.5–5.0)
Anion Gap: 9 mEq/L (ref 3–11)
BILIRUBIN TOTAL: 0.29 mg/dL (ref 0.20–1.20)
BUN: 13.3 mg/dL (ref 7.0–26.0)
CO2: 23 mEq/L (ref 22–29)
CREATININE: 0.8 mg/dL (ref 0.6–1.1)
Calcium: 9.6 mg/dL (ref 8.4–10.4)
Chloride: 109 mEq/L (ref 98–109)
Glucose: 76 mg/dl (ref 70–140)
Potassium: 4 mEq/L (ref 3.5–5.1)
SODIUM: 141 meq/L (ref 136–145)
Total Protein: 6.8 g/dL (ref 6.4–8.3)

## 2014-01-31 ENCOUNTER — Encounter (HOSPITAL_COMMUNITY): Payer: Self-pay

## 2014-01-31 ENCOUNTER — Ambulatory Visit (HOSPITAL_COMMUNITY)
Admission: RE | Admit: 2014-01-31 | Discharge: 2014-01-31 | Disposition: A | Discharge status: Home / Self Care | Payer: Medicare Other | Source: Ambulatory Visit | Attending: Internal Medicine | Admitting: Internal Medicine

## 2014-01-31 DIAGNOSIS — C169 Malignant neoplasm of stomach, unspecified: Secondary | ICD-10-CM | POA: Diagnosis present

## 2014-01-31 DIAGNOSIS — C3490 Malignant neoplasm of unspecified part of unspecified bronchus or lung: Secondary | ICD-10-CM | POA: Insufficient documentation

## 2014-01-31 DIAGNOSIS — Z4789 Encounter for other orthopedic aftercare: Secondary | ICD-10-CM | POA: Insufficient documentation

## 2014-01-31 MED ORDER — IOHEXOL 300 MG/ML  SOLN
80.0000 mL | Freq: Once | INTRAMUSCULAR | Status: AC | PRN
  Administered 2014-01-31: 80 mL via INTRAVENOUS

## 2014-02-01 ENCOUNTER — Ambulatory Visit (HOSPITAL_BASED_OUTPATIENT_CLINIC_OR_DEPARTMENT_OTHER): Payer: Medicare Other | Admitting: Nurse Practitioner

## 2014-02-01 ENCOUNTER — Ambulatory Visit (HOSPITAL_BASED_OUTPATIENT_CLINIC_OR_DEPARTMENT_OTHER): Payer: Medicare Other

## 2014-02-01 ENCOUNTER — Other Ambulatory Visit: Payer: Medicare Other

## 2014-02-01 ENCOUNTER — Encounter: Payer: Self-pay | Admitting: Nurse Practitioner

## 2014-02-01 ENCOUNTER — Other Ambulatory Visit (HOSPITAL_BASED_OUTPATIENT_CLINIC_OR_DEPARTMENT_OTHER): Payer: Medicare Other

## 2014-02-01 ENCOUNTER — Telehealth: Payer: Self-pay | Admitting: Internal Medicine

## 2014-02-01 VITALS — BP 125/67 | HR 98 | Temp 98.5°F | Resp 20 | Ht 59.0 in | Wt 86.9 lb

## 2014-02-01 DIAGNOSIS — R630 Anorexia: Secondary | ICD-10-CM | POA: Insufficient documentation

## 2014-02-01 DIAGNOSIS — C801 Malignant (primary) neoplasm, unspecified: Secondary | ICD-10-CM

## 2014-02-01 DIAGNOSIS — C3410 Malignant neoplasm of upper lobe, unspecified bronchus or lung: Secondary | ICD-10-CM

## 2014-02-01 DIAGNOSIS — C782 Secondary malignant neoplasm of pleura: Secondary | ICD-10-CM

## 2014-02-01 DIAGNOSIS — G620 Drug-induced polyneuropathy: Secondary | ICD-10-CM

## 2014-02-01 DIAGNOSIS — T451X5A Adverse effect of antineoplastic and immunosuppressive drugs, initial encounter: Secondary | ICD-10-CM

## 2014-02-01 DIAGNOSIS — Z5111 Encounter for antineoplastic chemotherapy: Secondary | ICD-10-CM

## 2014-02-01 DIAGNOSIS — E8809 Other disorders of plasma-protein metabolism, not elsewhere classified: Secondary | ICD-10-CM

## 2014-02-01 DIAGNOSIS — C3490 Malignant neoplasm of unspecified part of unspecified bronchus or lung: Secondary | ICD-10-CM

## 2014-02-01 DIAGNOSIS — C3491 Malignant neoplasm of unspecified part of right bronchus or lung: Secondary | ICD-10-CM

## 2014-02-01 LAB — COMPREHENSIVE METABOLIC PANEL (CC13)
ALT: 14 U/L (ref 0–55)
ANION GAP: 8 meq/L (ref 3–11)
AST: 22 U/L (ref 5–34)
Albumin: 2.6 g/dL — ABNORMAL LOW (ref 3.5–5.0)
Alkaline Phosphatase: 141 U/L (ref 40–150)
BUN: 11.4 mg/dL (ref 7.0–26.0)
CALCIUM: 9.4 mg/dL (ref 8.4–10.4)
CHLORIDE: 109 meq/L (ref 98–109)
CO2: 23 mEq/L (ref 22–29)
Creatinine: 0.8 mg/dL (ref 0.6–1.1)
Glucose: 149 mg/dl — ABNORMAL HIGH (ref 70–140)
Potassium: 3.6 mEq/L (ref 3.5–5.1)
Sodium: 140 mEq/L (ref 136–145)
Total Bilirubin: 0.32 mg/dL (ref 0.20–1.20)
Total Protein: 6.5 g/dL (ref 6.4–8.3)

## 2014-02-01 LAB — CBC WITH DIFFERENTIAL/PLATELET
BASO%: 0.5 % (ref 0.0–2.0)
BASOS ABS: 0.1 10*3/uL (ref 0.0–0.1)
EOS%: 0.3 % (ref 0.0–7.0)
Eosinophils Absolute: 0 10*3/uL (ref 0.0–0.5)
HCT: 31.8 % — ABNORMAL LOW (ref 34.8–46.6)
HEMOGLOBIN: 10 g/dL — AB (ref 11.6–15.9)
LYMPH#: 1.1 10*3/uL (ref 0.9–3.3)
LYMPH%: 8.6 % — ABNORMAL LOW (ref 14.0–49.7)
MCH: 30.3 pg (ref 25.1–34.0)
MCHC: 31.5 g/dL (ref 31.5–36.0)
MCV: 95.9 fL (ref 79.5–101.0)
MONO#: 1.4 10*3/uL — ABNORMAL HIGH (ref 0.1–0.9)
MONO%: 11 % (ref 0.0–14.0)
NEUT#: 10.2 10*3/uL — ABNORMAL HIGH (ref 1.5–6.5)
NEUT%: 79.6 % — ABNORMAL HIGH (ref 38.4–76.8)
PLATELETS: 274 10*3/uL (ref 145–400)
RBC: 3.32 10*6/uL — ABNORMAL LOW (ref 3.70–5.45)
RDW: 16 % — ABNORMAL HIGH (ref 11.2–14.5)
WBC: 12.8 10*3/uL — ABNORMAL HIGH (ref 3.9–10.3)

## 2014-02-01 MED ORDER — SODIUM CHLORIDE 0.9 % IV SOLN
Freq: Once | INTRAVENOUS | Status: AC
  Administered 2014-02-01: 11:00:00 via INTRAVENOUS

## 2014-02-01 MED ORDER — GEMCITABINE HCL CHEMO INJECTION 1 GM/26.3ML
1000.0000 mg/m2 | Freq: Once | INTRAVENOUS | Status: AC
  Administered 2014-02-01: 1292 mg via INTRAVENOUS
  Filled 2014-02-01: qty 33.98

## 2014-02-01 MED ORDER — PROCHLORPERAZINE MALEATE 10 MG PO TABS
10.0000 mg | ORAL_TABLET | Freq: Once | ORAL | Status: AC
  Administered 2014-02-01: 10 mg via ORAL

## 2014-02-01 MED ORDER — PROCHLORPERAZINE MALEATE 10 MG PO TABS
ORAL_TABLET | ORAL | Status: AC
  Filled 2014-02-01: qty 1

## 2014-02-01 NOTE — Assessment & Plan Note (Addendum)
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

## 2014-02-01 NOTE — Patient Instructions (Addendum)
[facility] Discharge Instructions for Patients Receiving Chemotherapy  Today you received the following chemotherapy agents:  Gemzar.  To help prevent nausea and vomiting after your treatment, we encourage you to take your nausea medication: Compazine 10 mg every 6 hours as needed.   If you develop nausea and vomiting that is not controlled by your nausea medication, call the clinic.   BELOW ARE SYMPTOMS THAT SHOULD BE REPORTED IMMEDIATELY:  *FEVER GREATER THAN 100.5 F  *CHILLS WITH OR WITHOUT FEVER  NAUSEA AND VOMITING THAT IS NOT CONTROLLED WITH YOUR NAUSEA MEDICATION  *UNUSUAL SHORTNESS OF BREATH  *UNUSUAL BRUISING OR BLEEDING  TENDERNESS IN MOUTH AND THROAT WITH OR WITHOUT PRESENCE OF ULCERS  *URINARY PROBLEMS  *BOWEL PROBLEMS  UNUSUAL RASH Items with * indicate a potential emergency and should be followed up as soon as possible.  Feel free to call the clinic you have any questions or concerns. The clinic phone number is [phone].   Gemcitabine injection What is this medicine? GEMCITABINE (jem SIT a been) is a chemotherapy drug. This medicine is used to treat many types of cancer like breast cancer, lung cancer, pancreatic cancer, and ovarian cancer. This medicine may be used for other purposes; ask your health care provider or pharmacist if you have questions. COMMON BRAND NAME(S): Gemzar What should I tell my health care provider before I take this medicine? They need to know if you have any of these conditions: -blood disorders -infection -kidney disease -liver disease -recent or ongoing radiation therapy -an unusual or allergic reaction to gemcitabine, other chemotherapy, other medicines, foods, dyes, or preservatives -pregnant or trying to get pregnant -breast-feeding How should I use this medicine? This drug is given as an infusion into a vein. It is administered in a hospital or clinic by a specially trained health care  professional. Talk to your pediatrician regarding the use of this medicine in children. Special care may be needed. Overdosage: If you think you have taken too much of this medicine contact a poison control center or emergency room at once. NOTE: This medicine is only for you. Do not share this medicine with others. What if I miss a dose? It is important not to miss your dose. Call your doctor or health care professional if you are unable to keep an appointment. What may interact with this medicine? -medicines to increase blood counts like filgrastim, pegfilgrastim, sargramostim -some other chemotherapy drugs like cisplatin -vaccines Talk to your doctor or health care professional before taking any of these medicines: -acetaminophen -aspirin -ibuprofen -ketoprofen -naproxen This list may not describe all possible interactions. Give your health care provider a list of all the medicines, herbs, non-prescription drugs, or dietary supplements you use. Also tell them if you smoke, drink alcohol, or use illegal drugs. Some items may interact with your medicine. What should I watch for while using this medicine? Visit your doctor for checks on your progress. This drug may make you feel generally unwell. This is not uncommon, as chemotherapy can affect healthy cells as well as cancer cells. Report any side effects. Continue your course of treatment even though you feel ill unless your doctor tells you to stop. In some cases, you may be given additional medicines to help with side effects. Follow all directions for their use. Call your doctor or health care professional for advice if you get a fever, chills or sore throat, or other symptoms of a cold or flu. Do not treat yourself. This drug decreases your  body's ability to fight infections. Try to avoid being around people who are sick. This medicine may increase your risk to bruise or bleed. Call your doctor or health care professional if you notice any  unusual bleeding. Be careful brushing and flossing your teeth or using a toothpick because you may get an infection or bleed more easily. If you have any dental work done, tell your dentist you are receiving this medicine. Avoid taking products that contain aspirin, acetaminophen, ibuprofen, naproxen, or ketoprofen unless instructed by your doctor. These medicines may hide a fever. Women should inform their doctor if they wish to become pregnant or think they might be pregnant. There is a potential for serious side effects to an unborn child. Talk to your health care professional or pharmacist for more information. Do not breast-feed an infant while taking this medicine. What side effects may I notice from receiving this medicine? Side effects that you should report to your doctor or health care professional as soon as possible: -allergic reactions like skin rash, itching or hives, swelling of the face, lips, or tongue -low blood counts - this medicine may decrease the number of white blood cells, red blood cells and platelets. You may be at increased risk for infections and bleeding. -signs of infection - fever or chills, cough, sore throat, pain or difficulty passing urine -signs of decreased platelets or bleeding - bruising, pinpoint red spots on the skin, black, tarry stools, blood in the urine -signs of decreased red blood cells - unusually weak or tired, fainting spells, lightheadedness -breathing problems -chest pain -mouth sores -nausea and vomiting -pain, swelling, redness at site where injected -pain, tingling, numbness in the hands or feet -stomach pain -swelling of ankles, feet, hands -unusual bleeding Side effects that usually do not require medical attention (report to your doctor or health care professional if they continue or are bothersome): -constipation -diarrhea -hair loss -loss of appetite -stomach upset This list may not describe all possible side effects. Call your  doctor for medical advice about side effects. You may report side effects to FDA at 1-800-FDA-1088. Where should I keep my medicine? This drug is given in a hospital or clinic and will not be stored at home. NOTE: This sheet is a summary. It may not cover all possible information. If you have questions about this medicine, talk to your doctor, pharmacist, or health care provider.  2015, [employer]vier/[employer] Standard. (2007-08-31 18:45:54)

## 2014-02-01 NOTE — Assessment & Plan Note (Signed)
Albumin has decreased from 3.3 down to 2.6.  Patient was encouraged to push protein in her diet is possible.

## 2014-02-01 NOTE — Progress Notes (Signed)
[facility]   Chief Complaint  Patient presents with  . Follow-up    HPI: [name] 80 y.o. 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  Status post 3 cycles of carboplatin/paclitaxel chemotherapy.  Patient presented to today for followup of her spindle cell carcinoma.  She obtained a restaging CT of the chest/abdomen/pelvis just yesterday.  She states she's been tolerating her chemotherapy fairly well; with the major complaint being a minimal appetite.  She states that she has tried a Medrol Dosepak in the past to stimulate her appetite; but that it did not help.  She states she has tried the nutritional supplement shakes; but this caused diarrhea.  She is amenable to speaking to the  [facility] nutritionist for other ideas to stimulate her appetite and to gain weight.  Patient's weight has remained stable today at 87 pounds.  Patient's also a reports that her peripheral neuropathy to all of her extremities is stable as well today.  She denies any recent nausea, vomiting, or diarrhea.  She denies any recent fevers or chills.   HPI  CURRENT THERAPY: Upcoming Treatment Dates - LUNG Gemcitabine D1,8 q21d Days with orders from any treatment category:  02/01/2014      SCHEDULING COMMUNICATION      prochlorperazine (COMPAZINE) tablet 10 mg      Gemcitabine HCl (GEMZAR) 1,292 mg in sodium chloride 0.9 % 100 mL chemo infusion      sodium chloride 0.9 % injection 10 mL      heparin lock flush 100 unit/mL      heparin lock flush 100 unit/mL      alteplase (CATHFLO ACTIVASE) injection 2 mg      sodium chloride 0.9 % injection 3 mL      Cold Pack 1 packet      0.9 %  sodium chloride infusion      TREATMENT CONDITIONS 02/08/2014      SCHEDULING COMMUNICATION      prochlorperazine (COMPAZINE) tablet 10 mg      Gemcitabine HCl (GEMZAR) 1,292 mg in sodium chloride 0.9 % 100 mL chemo infusion      sodium chloride 0.9 % injection 10 mL      heparin lock  flush 100 unit/mL      heparin lock flush 100 unit/mL      alteplase (CATHFLO ACTIVASE) injection 2 mg      sodium chloride 0.9 % injection 3 mL      Cold Pack 1 packet      0.9 %  sodium chloride infusion      TREATMENT CONDITIONS 02/22/2014      SCHEDULING COMMUNICATION      prochlorperazine (COMPAZINE) tablet 10 mg      Gemcitabine HCl (GEMZAR) 1,292 mg in sodium chloride 0.9 % 100 mL chemo infusion      sodium chloride 0.9 % injection 10 mL      heparin lock flush 100 unit/mL      heparin lock flush 100 unit/mL      alteplase (CATHFLO ACTIVASE) injection 2 mg      sodium chloride 0.9 % injection 3 mL      Cold Pack 1 packet      0.9 %  sodium chloride infusion      TREATMENT CONDITIONS    ROS  Past Medical History  Diagnosis Date  . Dyslipidemia   . Arthritis     Hx of osteoarthritis  . Shortness of breath  WITH EXERTION   . COPD (chronic obstructive pulmonary disease)   . Anemia   . History of chemotherapy     carboplatin/taxol  . Lung mass   . 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 History of radiation therapy 01/13/11-02/20/11    abdomen    Past Surgical History  Procedure Laterality Date  . Esophagogastrectomy, j tube 11/08/2010    . Cholecystectomy    . Abdominal hysterectomy    . Cholecystectomy    . Cataract extraction      RIGHT EYE   . Port-a-cath removal  05/13/2011    Procedure: REMOVAL PORT-A-CATH;  Surgeon: Stark Klein, MD;  Location: [facility];  Service: General;  Laterality: N/A;  Removal port-a-cath.  . Portacath placement    . Eye surgery    . Bronchoscopy  08/28/11    rt upper lung mass bx  . Cardia resected  11/2010  . Wedge resection  09/18/11    right vats Dr. Lanelle Bal    has Gastroesophageal cancer; Hypertension; Anemia; COPD (chronic obstructive pulmonary disease); Lung mass; Malignant tumor, spindle cell type; Anorexia; Neuropathy due to chemotherapeutic drug; and Hypoalbuminemia on her problem list.     is allergic to atarax.      Medication List       This list is accurate as of: 02/01/14 10:18 AM.  Always use your most recent med list.               albuterol 108 (90 BASE) MCG/ACT inhaler  Commonly known as:  PROVENTIL HFA;VENTOLIN HFA  Inhale 3 puffs into the lungs every 4 (four) hours as needed. For shortness of breath     B-12 100 MCG Tabs  Take 1 tablet by mouth as needed.     calcium-vitamin D 500-200 MG-UNIT per tablet  Commonly known as:  OSCAL WITH D  Take 1 tablet by mouth every other day.     CENTRUM SILVER PO  Take 1 tablet by mouth daily.     ICAPS PO  Take 1 tablet by mouth every other day.     cholecalciferol 1000 UNITS tablet  Commonly known as:  VITAMIN D  Take 1,000 Units by mouth daily.     clobetasol cream 0.05 %  Commonly known as:  TEMOVATE     fish oil-omega-3 fatty acids 1000 MG capsule  Take 1 g by mouth daily.     gabapentin 100 MG capsule  Commonly known as:  NEURONTIN  Take 1 capsule (100 mg total) by mouth daily.     Iron 325 (65 FE) MG Tabs  Take 1 tablet by mouth daily.     prochlorperazine 10 MG tablet  Commonly known as:  COMPAZINE  Take 1 tablet (10 mg total) by mouth every 6 (six) hours as needed for nausea or vomiting.         PHYSICAL EXAMINATION  Blood pressure 125/67, pulse 98, temperature 98.5 F (36.9 C), temperature source Oral, resp. rate 20, height 4' 11"  (1.499 m), weight 86 lb 14.4 oz (39.418 kg), SpO2 100.00%.  Physical Exam  Nursing note and vitals reviewed. Constitutional: She is oriented to person, place, and time. Vital signs are normal. She appears malnourished. She appears unhealthy. She appears cachectic.  HENT:  Head: Normocephalic and atraumatic.  Mouth/Throat: Oropharynx is clear and moist. No oropharyngeal exudate.  Eyes: Conjunctivae and EOM are normal. Pupils are equal, round, and reactive to light. No scleral icterus.  Neck: Normal range of motion. Neck supple. No  tracheal deviation present. No thyromegaly present.   Cardiovascular: Normal rate, regular rhythm, normal heart sounds and intact distal pulses.  Exam reveals no friction rub.   No murmur heard. Pulmonary/Chest: Effort normal and breath sounds normal. No respiratory distress.  Abdominal: Soft. Bowel sounds are normal. She exhibits no distension and no mass. There is no tenderness. There is no rebound.  Musculoskeletal: Normal range of motion. She exhibits no edema and no tenderness.  Lymphadenopathy:    She has no cervical adenopathy.  Neurological: She is alert and oriented to person, place, and time. Gait normal.  Skin: Skin is warm and dry. No rash noted. No erythema.  Psychiatric: Affect normal.    LABORATORY DATA:. CBC  Lab Results  Component Value Date   WBC 12.8* 02/01/2014   RBC 3.32* 02/01/2014   HGB 10.0* 02/01/2014   HCT 31.8* 02/01/2014   PLT 274 02/01/2014   MCV 95.9 02/01/2014   MCH 30.3 02/01/2014   MCHC 31.5 02/01/2014   RDW 16.0* 02/01/2014   LYMPHSABS 1.1 02/01/2014   MONOABS 1.4* 02/01/2014   EOSABS 0.0 02/01/2014   BASOSABS 0.1 02/01/2014     CMET  Lab Results  Component Value Date   NA 140 02/01/2014   K 3.6 02/01/2014   CL 108 09/17/2011   CO2 23 02/01/2014   GLUCOSE 149* 02/01/2014   BUN 11.4 02/01/2014   CREATININE 0.8 02/01/2014   CALCIUM 9.4 02/01/2014   PROT 6.5 02/01/2014   ALBUMIN 2.6* 02/01/2014   AST 22 02/01/2014   ALT 14 02/01/2014   ALKPHOS 141 02/01/2014   BILITOT 0.32 02/01/2014   GFRNONAA 87* 09/17/2011   GFRAA >90 09/17/2011      RADIOGRAPHIC STUDIES: CT Chest W Contrast Status: Final result         PACS Images    Show images for CT Chest W Contrast         Study Result    CLINICAL DATA: Gastric cancer.  EXAM:  CT CHEST, ABDOMEN, AND PELVIS WITH CONTRAST  TECHNIQUE:  Multidetector CT imaging of the chest, abdomen and pelvis was  performed following the standard protocol during bolus  administration of intravenous contrast.  CONTRAST: 28m OMNIPAQUE IOHEXOL 300 MG/ML SOLN   COMPARISON: 11/18/2013  FINDINGS:  CT CHEST FINDINGS  Chest wall:  No breast masses, supraclavicular or axillary lymphadenopathy. The  bony thorax is stable. Stable partially healed right rib fractures.  No destructive bone lesions. No spinal canal compromise.  Mediastinum:  The heart is normal in size. No pericardial effusion. No mediastinal  or hilar mass or lymphadenopathy. The aorta is normal in caliber. No  dissection. Stable atherosclerotic calcifications. The esophagus  demonstrates mild diffuse wall thickening which could be radiation  change. No focal mass. Surgical changes noted at the GE junction an  around the stomach.  Lungs:  Interval progression of cystic nodular enhancing pleural tumor on  the right side. The adjacent right lung nodule is unchanged. The  largest masslike area which may include coalescent lesions measures  8.2 x 3.3 cm on image number 36. There are stable surgical changes  involving the right hemi thorax along with probable radiation  changes. There are significant underlying emphysematous changes. No  left-sided pulmonary nodules. Small nodular density in the trachea  is most likely inflammatory debris.  CT ABDOMEN AND PELVIS FINDINGS  Hepatobiliary: No findings for hepatic metastatic disease. Stable  mild central biliary dilatation. No common bile duct or pancreatic  dilatation.  Pancreas: The pancreas demonstrates  moderate atrophy.  Spleen: Normal.  Adrenals/Urinary Tract: The adrenal glands and kidneys are  unremarkable.  Stomach/Bowel: Diffuse wall thickening of the stomach and  postoperative changes appears stable. I do not see a discrete mass.  The duodenum, small bowel and colon are grossly normal. No  inflammatory changes, mass lesions or obstructive findings.  Vascular/Lymphatic: No mesenteric or retroperitoneal mass or  adenopathy. Stable atherosclerotic changes involving the aorta but  no focal aneurysm or dissection.  Other: The  bladder is grossly normal. The uterus is surgically  absent. Both ovaries are still present and appear normal. No pelvic  mass or adenopathy. No free pelvic fluid collections. No inguinal  mass or adenopathy.  Musculoskeletal: No significant bony findings. No findings worrisome  for osseous metastatic disease.  IMPRESSION:  1. Progressive area of cystic change and nodular enhancement most  likely reflecting progressive pleural tumor on the right side. No  new lung lesions.  2. Stable incompletely healed right rib fractures.  3. Stable surgical changes and possible radiation changes involving  the stomach. There is diffuse wall thickening but no obvious mass  and no change since prior examinations.  4. No findings for abdominal/pelvic metastatic disease.  Electronically Signed  By: Kalman Jewels M.D.  On: 01/31/2014 12:20     ASSESSMENT/PLAN:    Malignant tumor, spindle cell type  Assessment & Plan Patient is status post 3 cycles of carboplatin/paclitaxel chemotherapy regimen.  Restaging scan obtained yesterday did reveal progression of disease.  All scan results were reviewed with patient per Dr. Julien Nordmann in detail.  Decision was made to switch patient's chemotherapy to Gemzar 1000 mg per meter squared on days #1 and 8 of an every three-week cycle.  Patient was agreeable to this plan.  Patient will proceed today with the new Gemzar regimen.  She will require labs only on the days she receives chemotherapy.  The plan will be to obtain restaging scans in approximately 9 weeks.  Patient has plans to return for cycle 1, day 8 of the Gemzar chemotherapy on Wednesday, 02/08/2014.   Anorexia  Assessment & Plan Patient states she has minimal appetite.  As she states that she has tried a Medrol dose pack to stimulate her appetite in the past; and has no desire to try the Medrol Dosepak again.  She states she's also try the boost/Ensure nutrition shakes; they caused diarrhea.  She has met  with a dietitian in the past.  She is amenable to speaking to a nutritionist once again.  Advised patient that there are high-protein clear supplement drinks that she may try.  Also encouraged patient to eat multiple small meals throughout the day if at all possible. Patient weight remains steady at 87 pounds. Will order a nutrition referral.    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   Hypoalbuminemia  Assessment & Plan Albumin has decreased from 3.3 down to 2.6.  Patient was encouraged to push protein in her diet is possible.    Patient stated understanding of all instructions; and was in agreement with this plan of care. The patient knows to call the clinic with any problems, questions or concerns.   This was a shared visit with Dr. Julien Nordmann today.    Total time spent with patient was  25  minutes;  with greater than  75  percent of that time spent in face to face counseling regarding  her symptoms, review of her restaging scans,  and coordination of care and follow up.  Disclaimer: This note was dictated with voice recognition software. Similar sounding words can inadvertently be transcribed and may not be corrected upon review.   Drue Second, NP 02/01/2014   ADDENDUM: Hematology/Oncology Attending: I had a face to face encounter with the patient. I recommended her care plan. 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 She status post 3 cycles of systemic chemotherapy with reduced dose carboplatin and paclitaxel. Unfortunately the recent CT scan of the chest showed evidence for disease progression. I discussed the scan results with the patient today. I gave her the option of treatment with second line chemotherapy in the form of gemcitabine 1000 mg/M2 on days 1 and 8 every 3 weeks versus palliative care. The patient is  interested in proceeding with the treatment. She will start cycle #1 today. The patient would come back for followup visit in 3 weeks with the start of cycle #2. She was advised to call immediately if she has any concerning symptoms in the interval.  Disclaimer: This note was dictated with voice recognition software. Similar sounding words can inadvertently be transcribed and may be missed upon review. Eilleen Kempf., MD 02/01/2014

## 2014-02-01 NOTE — Assessment & Plan Note (Signed)
Patient is status post 3 cycles of carboplatin/paclitaxel chemotherapy regimen.  Restaging scan obtained yesterday did reveal progression of disease.  All scan results were reviewed with patient per Dr. Julien Nordmann in detail.  Decision was made to switch patient's chemotherapy to Gemzar 1000 mg per meter squared on days #1 and 8 of an every three-week cycle.  Patient was agreeable to this plan.  Patient will proceed today with the new Gemzar regimen.  She will require labs only on the days she receives chemotherapy.  The plan will be to obtain restaging scans in approximately 9 weeks.  Patient has plans to return for cycle 1, day 8 of the Gemzar chemotherapy on Wednesday, 02/08/2014.

## 2014-02-01 NOTE — Assessment & Plan Note (Addendum)
Patient states she has minimal appetite.  As she states that she has tried a Medrol dose pack to stimulate her appetite in the past; and has no desire to try the Medrol Dosepak again.  She states she's also try the boost/Ensure nutrition shakes; they caused diarrhea.  She has met with a dietitian in the past.  She is amenable to speaking to a nutritionist once again.  Advised patient that there are high-protein clear supplement drinks that she may try.  Also encouraged patient to eat multiple small meals throughout the day if at all possible. Patient weight remains steady at 87 pounds. Will order a nutrition referral.  

## 2014-02-01 NOTE — Telephone Encounter (Signed)
s/w pt re next appt for 10/7 @ 11:45am. pt to get new schedule when she comes in. no provider availability for 10/7 - awaiting confirmation re 10/7 f/u w/an APP.

## 2014-02-02 ENCOUNTER — Telehealth: Payer: Self-pay | Admitting: *Deleted

## 2014-02-02 ENCOUNTER — Ambulatory Visit: Payer: Medicare Other

## 2014-02-02 NOTE — Telephone Encounter (Signed)
Called [facility] for chemotherapy F/U.  Patient is doing well.  Denies n/v.  Denies any new side effects or symptoms.  Bowel and bladder is functioning well.  Eating and drinking well and I instructed to drink 64 oz minimum daily or at least the day before, of and after treatment.  Asked how often her chemotherapy treatment has been changed and why.  Discussed scans and she says she wants to do all she can.  Denies further questions at this time and encouraged to call if needed.  Reviewed how to call after hours in the case of an emergency.

## 2014-02-06 ENCOUNTER — Telehealth: Payer: Self-pay | Admitting: *Deleted

## 2014-02-06 ENCOUNTER — Telehealth: Payer: Self-pay | Admitting: Internal Medicine

## 2014-02-06 NOTE — Telephone Encounter (Signed)
per LT she will see pt 10/7. s/w pt re new d/t for lb/LT/tx 10/7 @ 1:45pm.

## 2014-02-06 NOTE — Telephone Encounter (Signed)
Er schedeuler I have adjusted 10/7 aappt

## 2014-02-08 ENCOUNTER — Ambulatory Visit (HOSPITAL_BASED_OUTPATIENT_CLINIC_OR_DEPARTMENT_OTHER): Payer: Medicare Other

## 2014-02-08 ENCOUNTER — Ambulatory Visit (HOSPITAL_BASED_OUTPATIENT_CLINIC_OR_DEPARTMENT_OTHER): Payer: Medicare Other | Admitting: Nurse Practitioner

## 2014-02-08 ENCOUNTER — Other Ambulatory Visit (HOSPITAL_BASED_OUTPATIENT_CLINIC_OR_DEPARTMENT_OTHER): Payer: Medicare Other

## 2014-02-08 ENCOUNTER — Telehealth: Payer: Self-pay | Admitting: Internal Medicine

## 2014-02-08 VITALS — BP 125/68 | HR 89 | Temp 98.1°F | Resp 17 | Ht 59.0 in | Wt 84.0 lb

## 2014-02-08 DIAGNOSIS — Z5111 Encounter for antineoplastic chemotherapy: Secondary | ICD-10-CM

## 2014-02-08 DIAGNOSIS — C3491 Malignant neoplasm of unspecified part of right bronchus or lung: Secondary | ICD-10-CM

## 2014-02-08 DIAGNOSIS — C782 Secondary malignant neoplasm of pleura: Secondary | ICD-10-CM

## 2014-02-08 DIAGNOSIS — C3411 Malignant neoplasm of upper lobe, right bronchus or lung: Secondary | ICD-10-CM

## 2014-02-08 DIAGNOSIS — C801 Malignant (primary) neoplasm, unspecified: Secondary | ICD-10-CM

## 2014-02-08 LAB — COMPREHENSIVE METABOLIC PANEL (CC13)
ALK PHOS: 130 U/L (ref 40–150)
ALT: 20 U/L (ref 0–55)
AST: 29 U/L (ref 5–34)
Albumin: 2.8 g/dL — ABNORMAL LOW (ref 3.5–5.0)
Anion Gap: 8 mEq/L (ref 3–11)
BILIRUBIN TOTAL: 0.2 mg/dL (ref 0.20–1.20)
BUN: 18.7 mg/dL (ref 7.0–26.0)
CO2: 24 mEq/L (ref 22–29)
Calcium: 9.8 mg/dL (ref 8.4–10.4)
Chloride: 110 mEq/L — ABNORMAL HIGH (ref 98–109)
Creatinine: 0.9 mg/dL (ref 0.6–1.1)
Glucose: 97 mg/dl (ref 70–140)
Potassium: 4.4 mEq/L (ref 3.5–5.1)
SODIUM: 141 meq/L (ref 136–145)
Total Protein: 7.1 g/dL (ref 6.4–8.3)

## 2014-02-08 LAB — CBC WITH DIFFERENTIAL/PLATELET
BASO%: 1.2 % (ref 0.0–2.0)
Basophils Absolute: 0.1 10*3/uL (ref 0.0–0.1)
EOS%: 3.3 % (ref 0.0–7.0)
Eosinophils Absolute: 0.2 10*3/uL (ref 0.0–0.5)
HEMATOCRIT: 30.4 % — AB (ref 34.8–46.6)
HGB: 9.7 g/dL — ABNORMAL LOW (ref 11.6–15.9)
LYMPH#: 1 10*3/uL (ref 0.9–3.3)
LYMPH%: 22.4 % (ref 14.0–49.7)
MCH: 30.4 pg (ref 25.1–34.0)
MCHC: 31.9 g/dL (ref 31.5–36.0)
MCV: 95.4 fL (ref 79.5–101.0)
MONO#: 0.5 10*3/uL (ref 0.1–0.9)
MONO%: 11.5 % (ref 0.0–14.0)
NEUT#: 2.8 10*3/uL (ref 1.5–6.5)
NEUT%: 61.6 % (ref 38.4–76.8)
Platelets: 225 10*3/uL (ref 145–400)
RBC: 3.19 10*6/uL — AB (ref 3.70–5.45)
RDW: 15.1 % — ABNORMAL HIGH (ref 11.2–14.5)
WBC: 4.6 10*3/uL (ref 3.9–10.3)

## 2014-02-08 MED ORDER — PROCHLORPERAZINE MALEATE 10 MG PO TABS: 10.0000 mg | ORAL_TABLET | Freq: Four times a day (QID) | ORAL | Status: AC | PRN

## 2014-02-08 MED ORDER — PROCHLORPERAZINE MALEATE 10 MG PO TABS
ORAL_TABLET | ORAL | Status: AC
  Filled 2014-02-08: qty 1

## 2014-02-08 MED ORDER — SODIUM CHLORIDE 0.9 % IV SOLN
Freq: Once | INTRAVENOUS | Status: AC
  Administered 2014-02-08: 15:00:00 via INTRAVENOUS

## 2014-02-08 MED ORDER — ZOLPIDEM TARTRATE 5 MG PO TABS: 5.0000 mg | ORAL_TABLET | Freq: Every evening | ORAL | Status: DC | PRN

## 2014-02-08 MED ORDER — SODIUM CHLORIDE 0.9 % IV SOLN
1000.0000 mg/m2 | Freq: Once | INTRAVENOUS | Status: AC
  Administered 2014-02-08: 1292 mg via INTRAVENOUS
  Filled 2014-02-08: qty 33.98

## 2014-02-08 MED ORDER — PROCHLORPERAZINE MALEATE 10 MG PO TABS
10.0000 mg | ORAL_TABLET | Freq: Once | ORAL | Status: AC
  Administered 2014-02-08: 10 mg via ORAL

## 2014-02-08 NOTE — Telephone Encounter (Signed)
gv pt appt schedule for oct.

## 2014-02-08 NOTE — Patient Instructions (Signed)
[facility] Discharge Instructions for Patients Receiving Chemotherapy  Today you received the following chemotherapy agents: gemzar  To help prevent nausea and vomiting after your treatment, we encourage you to take your nausea medication.  Take it as often as prescribed.     If you develop nausea and vomiting that is not controlled by your nausea medication, call the clinic. If it is after clinic hours your family physician or the after hours number for the clinic or go to the Emergency Department.   BELOW ARE SYMPTOMS THAT SHOULD BE REPORTED IMMEDIATELY:  *FEVER GREATER THAN 100.5 F  *CHILLS WITH OR WITHOUT FEVER  NAUSEA AND VOMITING THAT IS NOT CONTROLLED WITH YOUR NAUSEA MEDICATION  *UNUSUAL SHORTNESS OF BREATH  *UNUSUAL BRUISING OR BLEEDING  TENDERNESS IN MOUTH AND THROAT WITH OR WITHOUT PRESENCE OF ULCERS  *URINARY PROBLEMS  *BOWEL PROBLEMS  UNUSUAL RASH Items with * indicate a potential emergency and should be followed up as soon as possible.  Feel free to call the clinic you have any questions or concerns. The clinic phone number is [phone].   I have been informed and understand all the instructions given to me. I know to contact the clinic, my physician, or go to the Emergency Department if any problems should occur. I do not have any questions at this time, but understand that I may call the clinic during office hours   should I have any questions or need assistance in obtaining follow up care.    __________________________________________  _____________  __________ Signature of Patient or Authorized Representative            Date                   Time    __________________________________________ Nurse's Signature

## 2014-02-08 NOTE — Progress Notes (Signed)
  [facility] OFFICE PROGRESS NOTE   Diagnosis:  Recurrent spindle cell carcinoma, sarcomatoid carcinoma of the right lung   INTERVAL HISTORY:   [name] returns as scheduled. She completed cycle 1 day 1 gemcitabine on 02/01/2014. She had mild nausea. No vomiting. No mouth sores. No diarrhea. No skin rash. She has stable mild dyspnea on exertion. No fever or cough. Her main complaint today is difficulty sleeping.  Objective:  Vital signs in last 24 hours:  Blood pressure 125/68, pulse 89, temperature 98.1 F (36.7 C), temperature source Oral, resp. rate 17, height 4\' 11"  (1.499 m), weight 84 lb (38.102 kg), SpO2 92.00%.    HEENT: No thrush or ulcers. Resp: Lungs clear bilaterally. Cardio: Regular rate and rhythm. GI: Abdomen soft and nontender. No hepatomegaly. Vascular: No leg edema. Neuro: Alert and oriented.  Skin: No rash.    Lab Results:  Lab Results  Component Value Date   WBC 4.6 02/08/2014   HGB 9.7* 02/08/2014   HCT 30.4* 02/08/2014   MCV 95.4 02/08/2014   PLT 225 02/08/2014   NEUTROABS 2.8 02/08/2014    Imaging:  No results found.  Medications: I have reviewed the patient's current medications.  Assessment/Plan: 1. Recurrent spindle cell carcinoma, sarcomatoid carcinoma of the right lung.  Initial diagnosis dates to March 2013.  Status post wedge resection of the right upper lobe 09/16/2011 with final pathology consistent with a spindle cell carcinoma.  Status post radiation to recurrent disease in the right lung completed 01/10/2013.  Status post stereotactic radiotherapy to metastatic deposits in the lung completed 06/03/2013.  Restaging CT evaluation 11/18/2013 with increased pleural and subpleural metastatic disease in the lateral right hemithorax with a small right pleural effusion; stable mild mediastinal lymphadenopathy.  Status post cycle 1 Taxol/carboplatin 11/30/2013. Cycle 2 Taxol/carboplatin 12/21/2013. Cycle 3 Taxol/carboplatin  01/11/2014. Restaging CT evaluation 01/31/2014 showed interval progression of cystic nodular enhancing pleural tumor on the right side. Initiation of gemcitabine 02/01/2014 on a day one/day 8 schedule of a 21 day cycle. 2. 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   Disposition: [name] appears stable. She tolerated cycle 1 day 1 gemcitabine well. Plan to proceed with cycle 1 day 8 today as scheduled. She will return for a followup visit and cycle 2 on 02/22/2014.  For the difficulty sleeping she will try Ambien 5 mg at bedtime as needed.  Plan reviewed with Dr. Julien Nordmann.    Ned Card ANP/GNP-BC   02/08/2014  3:01 PM

## 2014-02-15 ENCOUNTER — Other Ambulatory Visit: Payer: Medicare Other

## 2014-02-22 ENCOUNTER — Telehealth: Payer: Self-pay | Admitting: *Deleted

## 2014-02-22 ENCOUNTER — Other Ambulatory Visit (HOSPITAL_BASED_OUTPATIENT_CLINIC_OR_DEPARTMENT_OTHER): Payer: Medicare Other

## 2014-02-22 ENCOUNTER — Ambulatory Visit: Payer: Medicare Other | Admitting: Nutrition

## 2014-02-22 ENCOUNTER — Telehealth: Payer: Self-pay | Admitting: Internal Medicine

## 2014-02-22 ENCOUNTER — Ambulatory Visit (HOSPITAL_BASED_OUTPATIENT_CLINIC_OR_DEPARTMENT_OTHER): Payer: Medicare Other

## 2014-02-22 ENCOUNTER — Encounter: Payer: Self-pay | Admitting: Internal Medicine

## 2014-02-22 ENCOUNTER — Ambulatory Visit (HOSPITAL_BASED_OUTPATIENT_CLINIC_OR_DEPARTMENT_OTHER): Payer: Medicare Other | Admitting: Internal Medicine

## 2014-02-22 VITALS — BP 158/69 | HR 69 | Temp 97.5°F | Resp 17 | Ht 59.0 in | Wt 85.4 lb

## 2014-02-22 DIAGNOSIS — C3411 Malignant neoplasm of upper lobe, right bronchus or lung: Secondary | ICD-10-CM

## 2014-02-22 DIAGNOSIS — Z5111 Encounter for antineoplastic chemotherapy: Secondary | ICD-10-CM

## 2014-02-22 DIAGNOSIS — C782 Secondary malignant neoplasm of pleura: Secondary | ICD-10-CM

## 2014-02-22 DIAGNOSIS — D6481 Anemia due to antineoplastic chemotherapy: Secondary | ICD-10-CM

## 2014-02-22 DIAGNOSIS — T451X5A Adverse effect of antineoplastic and immunosuppressive drugs, initial encounter: Secondary | ICD-10-CM

## 2014-02-22 DIAGNOSIS — G620 Drug-induced polyneuropathy: Secondary | ICD-10-CM

## 2014-02-22 DIAGNOSIS — C801 Malignant (primary) neoplasm, unspecified: Secondary | ICD-10-CM

## 2014-02-22 DIAGNOSIS — C3491 Malignant neoplasm of unspecified part of right bronchus or lung: Secondary | ICD-10-CM

## 2014-02-22 LAB — COMPREHENSIVE METABOLIC PANEL
ALBUMIN: 3.4 g/dL — AB (ref 3.5–5.2)
ALK PHOS: 121 U/L — AB (ref 39–117)
ALT: 12 U/L (ref 0–35)
AST: 24 U/L (ref 0–37)
BILIRUBIN TOTAL: 0.2 mg/dL — AB (ref 0.3–1.2)
BUN: 12 mg/dL (ref 6–23)
CO2: 24 mEq/L (ref 19–32)
Calcium: 9.3 mg/dL (ref 8.4–10.5)
Chloride: 106 mEq/L (ref 96–112)
Creatinine, Ser: 0.89 mg/dL (ref 0.50–1.10)
Glucose, Bld: 97 mg/dL (ref 70–99)
Potassium: 4.1 mEq/L (ref 3.5–5.3)
SODIUM: 141 meq/L (ref 135–145)
Total Protein: 7.3 g/dL (ref 6.0–8.3)

## 2014-02-22 LAB — CBC WITH DIFFERENTIAL/PLATELET
BASO%: 1.3 % (ref 0.0–2.0)
Basophils Absolute: 0.1 10*3/uL (ref 0.0–0.1)
EOS%: 6 % (ref 0.0–7.0)
Eosinophils Absolute: 0.2 10*3/uL (ref 0.0–0.5)
HCT: 28.1 % — ABNORMAL LOW (ref 34.8–46.6)
HEMOGLOBIN: 9.1 g/dL — AB (ref 11.6–15.9)
LYMPH#: 1 10*3/uL (ref 0.9–3.3)
LYMPH%: 26.6 % (ref 14.0–49.7)
MCH: 30.1 pg (ref 25.1–34.0)
MCHC: 32.4 g/dL (ref 31.5–36.0)
MCV: 93 fL (ref 79.5–101.0)
MONO#: 0.6 10*3/uL (ref 0.1–0.9)
MONO%: 16.2 % — AB (ref 0.0–14.0)
NEUT#: 1.9 10*3/uL (ref 1.5–6.5)
NEUT%: 49.9 % (ref 38.4–76.8)
NRBC: 0 % (ref 0–0)
Platelets: 214 10*3/uL (ref 145–400)
RBC: 3.02 10*6/uL — AB (ref 3.70–5.45)
RDW: 16.6 % — ABNORMAL HIGH (ref 11.2–14.5)
WBC: 3.8 10*3/uL — ABNORMAL LOW (ref 3.9–10.3)

## 2014-02-22 MED ORDER — SODIUM CHLORIDE 0.9 % IV SOLN
1000.0000 mg/m2 | Freq: Once | INTRAVENOUS | Status: AC
  Administered 2014-02-22: 1292 mg via INTRAVENOUS
  Filled 2014-02-22: qty 33.98

## 2014-02-22 MED ORDER — PROCHLORPERAZINE MALEATE 10 MG PO TABS
ORAL_TABLET | ORAL | Status: AC
  Filled 2014-02-22: qty 1

## 2014-02-22 MED ORDER — SODIUM CHLORIDE 0.9 % IV SOLN
Freq: Once | INTRAVENOUS | Status: AC
  Administered 2014-02-22: 11:00:00 via INTRAVENOUS

## 2014-02-22 MED ORDER — DULOXETINE HCL 30 MG PO CPEP: 30.0000 mg | ORAL_CAPSULE | Freq: Every day | ORAL | Status: AC

## 2014-02-22 MED ORDER — PROCHLORPERAZINE MALEATE 10 MG PO TABS
10.0000 mg | ORAL_TABLET | Freq: Once | ORAL | Status: AC
  Administered 2014-02-22: 10 mg via ORAL

## 2014-02-22 NOTE — Patient Instructions (Signed)
[facility] Discharge Instructions for Patients Receiving Chemotherapy  Today you received the following chemotherapy agents gemzar  To help prevent nausea and vomiting after your treatment, we encourage you to take your nausea medication as directed.  If you develop nausea and vomiting that is not controlled by your nausea medication, call the clinic.   BELOW ARE SYMPTOMS THAT SHOULD BE REPORTED IMMEDIATELY:  *FEVER GREATER THAN 100.5 F  *CHILLS WITH OR WITHOUT FEVER  NAUSEA AND VOMITING THAT IS NOT CONTROLLED WITH YOUR NAUSEA MEDICATION  *UNUSUAL SHORTNESS OF BREATH  *UNUSUAL BRUISING OR BLEEDING  TENDERNESS IN MOUTH AND THROAT WITH OR WITHOUT PRESENCE OF ULCERS  *URINARY PROBLEMS  *BOWEL PROBLEMS  UNUSUAL RASH Items with * indicate a potential emergency and should be followed up as soon as possible.  Feel free to call the clinic you have any questions or concerns. The clinic phone number is [phone].  

## 2014-02-22 NOTE — Progress Notes (Signed)
80 year old female diagnosed with recurrent spindle cell cancer and sarcomatoid cancer of the right lung.  She is a patient of Dr. Julien Nordmann.  Past medical history includes gastric cancer in 2012, radiation therapy, dyslipidemia, arthritis, and COPD.  Medications include calcium with vitamin D, iron, omega-3 fatty acid, and Compazine.  Labs include albumin 3.4 on October 21.  Height: 4 feet 11 inches. Weight: 85.8 pounds 02/22/2014. Usual body weight: 95 pounds January 2015.  117 pounds 2 years ago. BMI: 17.33.  Patient reports she feels well.  She states her oral intake has increased.  Her appetite is slightly better.  She still only eats if she is hungry.  Patient's nausea has improved.  She denies vomiting, and mouth sores.  Bowels move on a daily basis.  She denies diarrhea.  She did not tolerate oral nutrition supplements.  Nutrition diagnosis: Underweight related to inadequate oral intake as evidenced by BMI of 17.33.  Intervention: Patient educated to consume meals and snacks throughout the day utilizing high-calorie, high-protein foods.  Fact sheets provided. Provided patient with samples of juice-based oral nutrition supplement samples.  Recommended patient consume twice a day between meals. Educated patient about tried [employer].  Provided coupons for patient. Questions were answered.  Teach back method used.  Contact information was given.  Monitoring, evaluation, goals: Patient will tolerate increased calories and protein to promote weight gain and improved energy.  Next visit: Not scheduled at this time.  Patient has my contact information to contact me for questions or concerns.  **Disclaimer: This note was dictated with voice recognition software. Similar sounding words can inadvertently be transcribed and this note may contain transcription errors which may not have been corrected upon publication of note.**

## 2014-02-22 NOTE — Telephone Encounter (Signed)
Pt confirmed labs/ov per 10/21 POF, sent msg to add chemo,  gave pt AVS.... KJ

## 2014-02-22 NOTE — Progress Notes (Signed)
[facility] [phone]   Fax:(336) [employer], [address] [place] [address]  DIAGNOSIS:  1) Recurrent spindle cell carcinoma, sarcomatoid carcinoma of the right lung with multiple right lung pleural based nodules, diagnosed in March of 2013.  2) locally advanced adenocarcinoma of the stomach status post resection in July of 2002 followed by concurrent chemoradiation under the care of Dr. Lisbeth Renshaw and Dr. Jamse Arn.  PRIOR THERAPY: 1) status post wedge resection of the right upper lobe under the care of Dr. Servando Snare on 09/16/2011 and the final pathology was consistent with a spindle cell carcinoma. 2) status post curative radiotherapy to the recurrent disease in the right lung under the care of Dr. Tammi Klippel completed on 01/10/2013. 3) status post stereotactic radiotherapy to the metastatic deposits in the lung completed 06/03/2013 under the care of Dr. Tammi Klippel. 4) Systemic chemotherapy with carboplatin for AUC of 5 and paclitaxel 175 mg/M2 every 3 weeks with Neulasta support. First dose 11/30/2013. Status post 3 cycles. Discontinued secondary to disease progression.  CURRENT THERAPY: Systemic chemotherapy with gemcitabine 1000 mg/M2 on days 1 and 8 every 3 weeks. Status post 1 cycle.  INTERVAL HISTORY: [name] 80 y.o. female returns to the clinic today for followup visit. He is tolerating her new chemotherapy with single agent gemcitabine fairly well with no significant adverse effects. The patient continues to have persistent peripheral neuropathy from the previous treatment was carboplatin and paclitaxel. She is taking Neurontin with little improvement. She denied having any significant fever or chills, no nausea or vomiting. The patient denied having any significant chest pain, shortness breath, cough or hemoptysis. She still very active and working in her yard. She has fatigue at times.    MEDICAL HISTORY: Past Medical History  Diagnosis Date  . Dyslipidemia   . Arthritis     Hx of osteoarthritis  . Shortness of breath     WITH EXERTION   . COPD (chronic obstructive pulmonary disease)   . Anemia   . History of chemotherapy     carboplatin/taxol  . Lung mass   . 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 History of radiation therapy 01/13/11-02/20/11    abdomen    ALLERGIES:  is allergic to atarax.  MEDICATIONS:  Current Outpatient Prescriptions  Medication Sig Dispense Refill  . calcium-vitamin D (OSCAL WITH D) 500-200 MG-UNIT per tablet Take 1 tablet by mouth every other day.       . cholecalciferol (VITAMIN D) 1000 UNITS tablet Take 1,000 Units by mouth daily.      . clobetasol cream (TEMOVATE) 0.05 %       . Cyanocobalamin (B-12) 100 MCG TABS Take 1 tablet by mouth as needed.       . Ferrous Sulfate (IRON) 325 (65 FE) MG TABS Take 1 tablet by mouth daily.       . fish oil-omega-3 fatty acids 1000 MG capsule Take 1 g by mouth daily.       Marland Kitchen gabapentin (NEURONTIN) 100 MG capsule Take 1 capsule (100 mg total) by mouth daily.  30 capsule  0  . Multiple Vitamins-Minerals (CENTRUM SILVER PO) Take 1 tablet by mouth daily.       . Multiple Vitamins-Minerals (ICAPS PO) Take 1 tablet by mouth every other day.       . zolpidem (AMBIEN) 5 MG tablet Take 1 tablet (5 mg total) by mouth at bedtime as needed for  sleep.  30 tablet  0  . albuterol (PROVENTIL HFA;VENTOLIN HFA) 108 (90 BASE) MCG/ACT inhaler Inhale 3 puffs into the lungs every 4 (four) hours as needed. For shortness of breath       . prochlorperazine (COMPAZINE) 10 MG tablet Take 1 tablet (10 mg total) by mouth every 6 (six) hours as needed for nausea or vomiting.  30 tablet  1   No current facility-administered medications for this visit.    SURGICAL HISTORY:  Past Surgical History  Procedure Laterality Date  . Esophagogastrectomy, j tube 11/08/2010    . Cholecystectomy    . Abdominal hysterectomy    . Cholecystectomy     . Cataract extraction      RIGHT EYE   . Port-a-cath removal  05/13/2011    Procedure: REMOVAL PORT-A-CATH;  Surgeon: Stark Klein, MD;  Location: [facility];  Service: General;  Laterality: N/A;  Removal port-a-cath.  . Portacath placement    . Eye surgery    . Bronchoscopy  08/28/11    rt upper lung mass bx  . Cardia resected  11/2010  . Wedge resection  09/18/11    right vats Dr. Lanelle Bal    REVIEW OF SYSTEMS:  Constitutional: negative Eyes: negative Ears, nose, mouth, throat, and face: negative Respiratory: negative Cardiovascular: negative Gastrointestinal: negative Genitourinary:negative Integument/breast: negative Hematologic/lymphatic: negative Musculoskeletal:negative Neurological: negative Behavioral/Psych: negative Endocrine: negative Allergic/Immunologic: negative   PHYSICAL EXAMINATION: General appearance: alert, cooperative and no distress Head: Normocephalic, without obvious abnormality, atraumatic Neck: no adenopathy, no JVD, supple, symmetrical, trachea midline and thyroid not enlarged, symmetric, no tenderness/mass/nodules Lymph nodes: Cervical, supraclavicular, and axillary nodes normal. Resp: clear to auscultation bilaterally Back: symmetric, no curvature. ROM normal. No CVA tenderness. Cardio: regular rate and rhythm, S1, S2 normal, no murmur, click, rub or gallop GI: soft, non-tender; bowel sounds normal; no masses,  no organomegaly Extremities: extremities normal, atraumatic, no cyanosis or edema Neurologic: Alert and oriented X 3, normal strength and tone. Normal symmetric reflexes. Normal coordination and gait  ECOG PERFORMANCE STATUS: 1 - Symptomatic but completely ambulatory  Blood pressure 158/69, pulse 69, temperature 97.5 F (36.4 C), temperature source Oral, resp. rate 17, height 4\' 11"  (1.499 m), weight 85 lb 6.4 oz (38.737 kg), SpO2 90.00%.  LABORATORY DATA: Lab Results  Component Value Date   WBC 3.8* 02/22/2014   HGB 9.1* 02/22/2014    HCT 28.1* 02/22/2014   MCV 93.0 02/22/2014   PLT 214 02/22/2014      Chemistry      Component Value Date/Time   NA 141 02/22/2014 0852   NA 141 02/08/2014 1340   NA 143 07/23/2011 0916   K 4.1 02/22/2014 0852   K 4.4 02/08/2014 1340   K 3.8 07/23/2011 0916   CL 106 02/22/2014 0852   CL 104 07/23/2011 0916   CO2 24 02/22/2014 0852   CO2 24 02/08/2014 1340   CO2 23 07/23/2011 0916   BUN 12 02/22/2014 0852   BUN 18.7 02/08/2014 1340   BUN 7 07/23/2011 0916   CREATININE 0.89 02/22/2014 0852   CREATININE 0.9 02/08/2014 1340   CREATININE 0.8 07/23/2011 0916      Component Value Date/Time   CALCIUM 9.3 02/22/2014 0852   CALCIUM 9.8 02/08/2014 1340   CALCIUM 8.9 07/23/2011 0916   ALKPHOS 121* 02/22/2014 0852   ALKPHOS 130 02/08/2014 1340   ALKPHOS 109* 07/23/2011 0916   AST 24 02/22/2014 0852   AST 29 02/08/2014 1340   AST 31 07/23/2011 0916  ALT 12 02/22/2014 0852   ALT 20 02/08/2014 1340   ALT 22 07/23/2011 0916   BILITOT 0.2* 02/22/2014 0852   BILITOT 0.20 02/08/2014 1340   BILITOT 0.60 07/23/2011 0916       RADIOGRAPHIC STUDIES:  ASSESSMENT AND PLAN: This is a very pleasant 80 years old African American female with recurrent spindle cell carcinoma of the right lung consistent with sarcomatoid carcinoma, status post curative radiotherapy completed on 01/10/2013.  She also underwent a course of stereotactic radiotherapy to the pleural/subpleural area under the care of Dr. Tammi Klippel. Her recent scan showed evidence for disease progression with increased pleural and subpleural metastatic disease in the lateral right hemithorax. She underwent systemic chemotherapy with carboplatin and paclitaxel is status post 3 cycles and tolerating her treatment fairly well with no significant adverse effects except for the peripheral neuropathy and this was discontinued secondary to disease progression. She is currently undergoing second line systemic chemotherapy with single agent gemcitabine status post  1 cycle. She is tolerating this treatment well. I recommended for the patient to proceed with her treatment today as scheduled. She would come back for followup visit in 3 weeks with the start of cycle #3. For the peripheral neuropathy the patient will continue her current treatment with Neurontin but I will add Cymbalta 30 mg by mouth each bedtime. She was advised to call immediately if she has any concerning symptoms in the interval.  The patient voices understanding of current disease status and treatment options and is in agreement with the current care plan.  All questions were answered. The patient knows to call the clinic with any problems, questions or concerns. We can certainly see the patient much sooner if necessary.  Disclaimer: This note was dictated with voice recognition software. Similar sounding words can inadvertently be transcribed and may not be corrected upon review.

## 2014-02-22 NOTE — Telephone Encounter (Signed)
Per staff message and POF I have scheduled appts. Advised scheduler of appts. JMW  

## 2014-02-23 ENCOUNTER — Ambulatory Visit: Payer: Medicare Other

## 2014-03-01 ENCOUNTER — Ambulatory Visit (HOSPITAL_BASED_OUTPATIENT_CLINIC_OR_DEPARTMENT_OTHER): Payer: Medicare Other

## 2014-03-01 ENCOUNTER — Other Ambulatory Visit (HOSPITAL_BASED_OUTPATIENT_CLINIC_OR_DEPARTMENT_OTHER): Payer: Medicare Other

## 2014-03-01 VITALS — BP 140/72 | HR 89 | Temp 97.9°F | Resp 16

## 2014-03-01 DIAGNOSIS — R5381 Other malaise: Secondary | ICD-10-CM

## 2014-03-01 DIAGNOSIS — C3491 Malignant neoplasm of unspecified part of right bronchus or lung: Secondary | ICD-10-CM

## 2014-03-01 DIAGNOSIS — R197 Diarrhea, unspecified: Secondary | ICD-10-CM

## 2014-03-01 DIAGNOSIS — C3410 Malignant neoplasm of upper lobe, unspecified bronchus or lung: Secondary | ICD-10-CM

## 2014-03-01 DIAGNOSIS — R110 Nausea: Secondary | ICD-10-CM

## 2014-03-01 LAB — CBC WITH DIFFERENTIAL/PLATELET
BASO%: 2.3 % — ABNORMAL HIGH (ref 0.0–2.0)
Basophils Absolute: 0 10*3/uL (ref 0.0–0.1)
EOS%: 1.2 % (ref 0.0–7.0)
Eosinophils Absolute: 0 10*3/uL (ref 0.0–0.5)
HCT: 27.3 % — ABNORMAL LOW (ref 34.8–46.6)
HEMOGLOBIN: 8.9 g/dL — AB (ref 11.6–15.9)
LYMPH%: 62.6 % — ABNORMAL HIGH (ref 14.0–49.7)
MCH: 30.5 pg (ref 25.1–34.0)
MCHC: 32.6 g/dL (ref 31.5–36.0)
MCV: 93.5 fL (ref 79.5–101.0)
MONO#: 0.5 10*3/uL (ref 0.1–0.9)
MONO%: 26.3 % — AB (ref 0.0–14.0)
NEUT#: 0.1 10*3/uL — CL (ref 1.5–6.5)
NEUT%: 7.6 % — ABNORMAL LOW (ref 38.4–76.8)
PLATELETS: 319 10*3/uL (ref 145–400)
RBC: 2.92 10*6/uL — ABNORMAL LOW (ref 3.70–5.45)
RDW: 16.8 % — AB (ref 11.2–14.5)
WBC: 1.7 10*3/uL — ABNORMAL LOW (ref 3.9–10.3)
lymph#: 1.1 10*3/uL (ref 0.9–3.3)

## 2014-03-01 LAB — COMPREHENSIVE METABOLIC PANEL (CC13)
ALK PHOS: 113 U/L (ref 40–150)
ALT: 17 U/L (ref 0–55)
ANION GAP: 8 meq/L (ref 3–11)
AST: 26 U/L (ref 5–34)
Albumin: 2.9 g/dL — ABNORMAL LOW (ref 3.5–5.0)
BUN: 12.7 mg/dL (ref 7.0–26.0)
CALCIUM: 9.8 mg/dL (ref 8.4–10.4)
CO2: 23 mEq/L (ref 22–29)
Chloride: 113 mEq/L — ABNORMAL HIGH (ref 98–109)
Creatinine: 0.8 mg/dL (ref 0.6–1.1)
Glucose: 110 mg/dl (ref 70–140)
Potassium: 3.5 mEq/L (ref 3.5–5.1)
Sodium: 144 mEq/L (ref 136–145)
Total Bilirubin: 0.2 mg/dL (ref 0.20–1.20)
Total Protein: 6.7 g/dL (ref 6.4–8.3)

## 2014-03-01 MED ORDER — SODIUM CHLORIDE 0.9 % IV SOLN
Freq: Once | INTRAVENOUS | Status: AC
  Administered 2014-03-01: 15:00:00 via INTRAVENOUS

## 2014-03-01 NOTE — Progress Notes (Signed)
ANC 0.1 today; patient reports fatigue and nausea with diarrhea increased from baseline. Dr. Julien Nordmann notified. Hold chemotherapy, administer 1 L NS IVF over 2 hours.   Patient discharged home in stable condition, no acute distress. Patient verbalizes understanding of neutropenia precautions and when she would need to call clinic. Masks given at discharge.

## 2014-03-01 NOTE — Progress Notes (Signed)
Counseling intern sat with patient in the infusion room for about half an hour. The patient seemed to be in good spirits, and enjoyed passing the time with conversation.  [facility] Counseling Intern [phone]

## 2014-03-01 NOTE — Patient Instructions (Signed)
Neutropenia Neutropenia is a condition that occurs when the level of a certain type of white blood cell (neutrophil) in your body becomes lower than normal. Neutrophils are made in the bone marrow and fight infections. These cells protect against bacteria and viruses. The fewer neutrophils you have, and the longer your body remains without them, the greater your risk of getting a severe infection becomes. CAUSES  The cause of neutropenia may be hard to determine. However, it is usually due to 3 main problems:   Decreased production of neutrophils. This may be due to:  Certain medicines such as chemotherapy.  Genetic problems.  Cancer.  Radiation treatments.  Vitamin deficiency.  Some pesticides.  Increased destruction of neutrophils. This may be due to:  Overwhelming infections.  Hemolytic anemia. This is when the body destroys its own blood cells.  Chemotherapy.  Neutrophils moving to areas of the body where they cannot fight infections. This may be due to:  Dialysis procedures.  Conditions where the spleen becomes enlarged. Neutrophils are held in the spleen and are not available to the rest of the body.  Overwhelming infections. The neutrophils are held in the area of the infection and are not available to the rest of the body. SYMPTOMS  There are no specific symptoms of neutropenia. The lack of neutrophils can result in an infection, and an infection can cause various problems. DIAGNOSIS  Diagnosis is made by a blood test. A complete blood count is performed. The normal level of neutrophils in human blood differs with age and race. Infants have lower counts than older children and adults. African Americans have lower counts than Caucasians or Asians. The average adult level is 1500 cells/mm3 of blood. Neutrophil counts are interpreted as follows:  Greater than 1000 cells/mm3 gives normal protection against infection.  500 to 1000 cells/mm3 gives an increased risk for  infection.  200 to 500 cells/mm3 is a greater risk for severe infection.  Lower than 200 cells/mm3 is a marked risk of infection. This may require hospitalization and treatment with antibiotic medicines. TREATMENT  Treatment depends on the underlying cause, severity, and presence of infections or symptoms. It also depends on your health. Your caregiver will discuss the treatment plan with you. Mild cases are often easily treated and have a good outcome. Preventative measures may also be started to limit your risk of infections. Treatment can include:  Taking antibiotics.  Stopping medicines that are known to cause neutropenia.  Correcting nutritional deficiencies by eating green vegetables to supply folic acid and taking vitamin B supplements.  Stopping exposure to pesticides if your neutropenia is related to pesticide exposure.  Taking a blood growth factor called sargramostim, pegfilgrastim, or filgrastim if you are undergoing chemotherapy for cancer. This stimulates white blood cell production.  Removal of the spleen if you have Felty's syndrome and have repeated infections. HOME CARE INSTRUCTIONS   Follow your caregiver's instructions about when you need to have blood work done.  Wash your hands often. Make sure others who come in contact with you also wash their hands.  Wash raw fruits and vegetables before eating them. They can carry bacteria and fungi.  Avoid people with colds or spreadable (contagious) diseases (chickenpox, herpes zoster, influenza).  Avoid large crowds.  Avoid construction areas. The dust can release fungus into the air.  Be cautious around children in daycare or school environments.  Take care of your respiratory system by coughing and deep breathing.  Bathe daily.  Protect your skin from cuts and   burns.  Do not work in the garden or with flowers and plants.  Care for the mouth before and after meals by brushing with a soft toothbrush. If you have  mucositis, do not use mouthwash. Mouthwash contains alcohol and can dry out the mouth even more.  Clean the area between the genitals and the anus (perineal area) after urination and bowel movements. Women need to wipe from front to back.  Use a water soluble lubricant during sexual intercourse and practice good hygiene after. Do not have intercourse if you are severely neutropenic. Check with your caregiver for guidelines.  Exercise daily as tolerated.  Avoid people who were vaccinated with a live vaccine in the past 30 days. You should not receive live vaccines (polio, typhoid).  Do not provide direct care for pets. Avoid animal droppings. Do not clean litter boxes and bird cages.  Do not share food utensils.  Do not use tampons, enemas, or rectal suppositories unless directed by your caregiver.  Use an electric razor to remove hair.  Wash your hands after handling magazines, letters, and newspapers. SEEK IMMEDIATE MEDICAL CARE IF:   You have a fever.  You have chills or start to shake.  You feel nauseous or vomit.  You develop mouth sores.  You develop aches and pains.  You have redness and swelling around open wounds.  Your skin is warm to the touch.  You have pus coming from your wounds.  You develop swollen lymph nodes.  You feel weak or fatigued.  You develop red streaks on the skin. MAKE SURE YOU:  Understand these instructions.  Will watch your condition.  Will get help right away if you are not doing well or get worse. Document Released: 10/11/2001 Document Revised: 07/14/2011 Document Reviewed: 11/08/2010 Exit[employer] Patient Information 2015 [employer]. This information is not intended to replace advice given to you by your health care provider. Make sure you discuss any questions you have with your health care provider.  

## 2014-03-08 ENCOUNTER — Other Ambulatory Visit (HOSPITAL_BASED_OUTPATIENT_CLINIC_OR_DEPARTMENT_OTHER): Payer: Medicare Other

## 2014-03-08 DIAGNOSIS — C782 Secondary malignant neoplasm of pleura: Secondary | ICD-10-CM

## 2014-03-08 DIAGNOSIS — C3411 Malignant neoplasm of upper lobe, right bronchus or lung: Secondary | ICD-10-CM

## 2014-03-08 DIAGNOSIS — C3491 Malignant neoplasm of unspecified part of right bronchus or lung: Secondary | ICD-10-CM

## 2014-03-08 LAB — COMPREHENSIVE METABOLIC PANEL (CC13)
ALT: 13 U/L (ref 0–55)
ANION GAP: 9 meq/L (ref 3–11)
AST: 27 U/L (ref 5–34)
Albumin: 3.1 g/dL — ABNORMAL LOW (ref 3.5–5.0)
Alkaline Phosphatase: 105 U/L (ref 40–150)
BUN: 11.5 mg/dL (ref 7.0–26.0)
CALCIUM: 9.4 mg/dL (ref 8.4–10.4)
CHLORIDE: 109 meq/L (ref 98–109)
CO2: 23 meq/L (ref 22–29)
Creatinine: 0.9 mg/dL (ref 0.6–1.1)
Glucose: 100 mg/dl (ref 70–140)
Potassium: 3.9 mEq/L (ref 3.5–5.1)
Sodium: 141 mEq/L (ref 136–145)
Total Bilirubin: 0.21 mg/dL (ref 0.20–1.20)
Total Protein: 6.8 g/dL (ref 6.4–8.3)

## 2014-03-08 LAB — CBC WITH DIFFERENTIAL/PLATELET
BASO%: 0.5 % (ref 0.0–2.0)
BASOS ABS: 0 10*3/uL (ref 0.0–0.1)
EOS ABS: 0.2 10*3/uL (ref 0.0–0.5)
EOS%: 3.5 % (ref 0.0–7.0)
HEMATOCRIT: 28.6 % — AB (ref 34.8–46.6)
HEMOGLOBIN: 9.2 g/dL — AB (ref 11.6–15.9)
LYMPH#: 1 10*3/uL (ref 0.9–3.3)
LYMPH%: 16.7 % (ref 14.0–49.7)
MCH: 30.6 pg (ref 25.1–34.0)
MCHC: 32.2 g/dL (ref 31.5–36.0)
MCV: 95 fL (ref 79.5–101.0)
MONO#: 1 10*3/uL — AB (ref 0.1–0.9)
MONO%: 16.7 % — ABNORMAL HIGH (ref 0.0–14.0)
NEUT%: 62.6 % (ref 38.4–76.8)
NEUTROS ABS: 3.7 10*3/uL (ref 1.5–6.5)
Platelets: 196 10*3/uL (ref 145–400)
RBC: 3.01 10*6/uL — ABNORMAL LOW (ref 3.70–5.45)
RDW: 18.2 % — AB (ref 11.2–14.5)
WBC: 6 10*3/uL (ref 3.9–10.3)

## 2014-03-15 ENCOUNTER — Ambulatory Visit (HOSPITAL_BASED_OUTPATIENT_CLINIC_OR_DEPARTMENT_OTHER): Payer: Medicare Other

## 2014-03-15 ENCOUNTER — Ambulatory Visit (HOSPITAL_BASED_OUTPATIENT_CLINIC_OR_DEPARTMENT_OTHER): Payer: Medicare Other | Admitting: Physician Assistant

## 2014-03-15 ENCOUNTER — Encounter: Payer: Self-pay | Admitting: Physician Assistant

## 2014-03-15 ENCOUNTER — Other Ambulatory Visit (HOSPITAL_BASED_OUTPATIENT_CLINIC_OR_DEPARTMENT_OTHER): Payer: Medicare Other

## 2014-03-15 ENCOUNTER — Telehealth: Payer: Self-pay | Admitting: Physician Assistant

## 2014-03-15 VITALS — BP 146/82 | HR 93 | Resp 17 | Ht 59.0 in | Wt 84.0 lb

## 2014-03-15 DIAGNOSIS — C3411 Malignant neoplasm of upper lobe, right bronchus or lung: Secondary | ICD-10-CM

## 2014-03-15 DIAGNOSIS — C801 Malignant (primary) neoplasm, unspecified: Secondary | ICD-10-CM

## 2014-03-15 DIAGNOSIS — C160 Malignant neoplasm of cardia: Secondary | ICD-10-CM

## 2014-03-15 DIAGNOSIS — Z5111 Encounter for antineoplastic chemotherapy: Secondary | ICD-10-CM

## 2014-03-15 DIAGNOSIS — C3491 Malignant neoplasm of unspecified part of right bronchus or lung: Secondary | ICD-10-CM

## 2014-03-15 DIAGNOSIS — C782 Secondary malignant neoplasm of pleura: Secondary | ICD-10-CM

## 2014-03-15 LAB — CBC WITH DIFFERENTIAL/PLATELET
BASO%: 0.6 % (ref 0.0–2.0)
Basophils Absolute: 0 10*3/uL (ref 0.0–0.1)
EOS ABS: 0.1 10*3/uL (ref 0.0–0.5)
EOS%: 1 % (ref 0.0–7.0)
HCT: 32.9 % — ABNORMAL LOW (ref 34.8–46.6)
HGB: 10.4 g/dL — ABNORMAL LOW (ref 11.6–15.9)
LYMPH%: 13.8 % — ABNORMAL LOW (ref 14.0–49.7)
MCH: 30.2 pg (ref 25.1–34.0)
MCHC: 31.6 g/dL (ref 31.5–36.0)
MCV: 95.6 fL (ref 79.5–101.0)
MONO#: 0.8 10*3/uL (ref 0.1–0.9)
MONO%: 11.9 % (ref 0.0–14.0)
NEUT%: 72.7 % (ref 38.4–76.8)
NEUTROS ABS: 4.9 10*3/uL (ref 1.5–6.5)
Platelets: 275 10*3/uL (ref 145–400)
RBC: 3.44 10*6/uL — AB (ref 3.70–5.45)
RDW: 18.6 % — AB (ref 11.2–14.5)
WBC: 6.7 10*3/uL (ref 3.9–10.3)
lymph#: 0.9 10*3/uL (ref 0.9–3.3)

## 2014-03-15 LAB — COMPREHENSIVE METABOLIC PANEL (CC13)
ALBUMIN: 3.3 g/dL — AB (ref 3.5–5.0)
ALK PHOS: 117 U/L (ref 40–150)
ALT: 12 U/L (ref 0–55)
AST: 24 U/L (ref 5–34)
Anion Gap: 10 mEq/L (ref 3–11)
BUN: 14.4 mg/dL (ref 7.0–26.0)
CO2: 23 mEq/L (ref 22–29)
Calcium: 10.9 mg/dL — ABNORMAL HIGH (ref 8.4–10.4)
Chloride: 108 mEq/L (ref 98–109)
Creatinine: 0.8 mg/dL (ref 0.6–1.1)
Glucose: 117 mg/dl (ref 70–140)
POTASSIUM: 3.6 meq/L (ref 3.5–5.1)
SODIUM: 141 meq/L (ref 136–145)
TOTAL PROTEIN: 7.4 g/dL (ref 6.4–8.3)
Total Bilirubin: 0.51 mg/dL (ref 0.20–1.20)

## 2014-03-15 MED ORDER — SODIUM CHLORIDE 0.9 % IV SOLN
Freq: Once | INTRAVENOUS | Status: AC
  Administered 2014-03-15: 10:00:00 via INTRAVENOUS

## 2014-03-15 MED ORDER — PROCHLORPERAZINE MALEATE 10 MG PO TABS
ORAL_TABLET | ORAL | Status: AC
  Filled 2014-03-15: qty 1

## 2014-03-15 MED ORDER — SODIUM CHLORIDE 0.9 % IV SOLN
1000.0000 mg/m2 | Freq: Once | INTRAVENOUS | Status: AC
  Administered 2014-03-15: 1292 mg via INTRAVENOUS
  Filled 2014-03-15: qty 33.98

## 2014-03-15 MED ORDER — PROCHLORPERAZINE MALEATE 10 MG PO TABS
10.0000 mg | ORAL_TABLET | Freq: Once | ORAL | Status: AC
  Administered 2014-03-15: 10 mg via ORAL

## 2014-03-15 NOTE — Patient Instructions (Signed)
[facility] Discharge Instructions for Patients Receiving Chemotherapy  Today you received the following chemotherapy agents Gemzar.  To help prevent nausea and vomiting after your treatment, we encourage you to take your nausea medication.   If you develop nausea and vomiting that is not controlled by your nausea medication, call the clinic.   BELOW ARE SYMPTOMS THAT SHOULD BE REPORTED IMMEDIATELY:  *FEVER GREATER THAN 100.5 F  *CHILLS WITH OR WITHOUT FEVER  NAUSEA AND VOMITING THAT IS NOT CONTROLLED WITH YOUR NAUSEA MEDICATION  *UNUSUAL SHORTNESS OF BREATH  *UNUSUAL BRUISING OR BLEEDING  TENDERNESS IN MOUTH AND THROAT WITH OR WITHOUT PRESENCE OF ULCERS  *URINARY PROBLEMS  *BOWEL PROBLEMS  UNUSUAL RASH Items with * indicate a potential emergency and should be followed up as soon as possible.  Feel free to call the clinic you have any questions or concerns. The clinic phone number is [phone].

## 2014-03-15 NOTE — Telephone Encounter (Signed)
Gave avs & cal for Nov/Dec. Sent mess to sch tx °

## 2014-03-15 NOTE — Progress Notes (Addendum)
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  2) locally advanced adenocarcinoma of the stomach status post resection in July of 2002 followed by concurrent chemoradiation under the care of Dr. Lisbeth Renshaw and Dr. Jamse Arn.  PRIOR THERAPY: 1) status post wedge resection of the right upper lobe under the care of Dr. Servando Snare on 09/16/2011 and the final pathology was consistent with a spindle cell carcinoma. 2) status post curative radiotherapy to the recurrent disease in the right lung under the care of Dr. Tammi Klippel completed on 01/10/2013. 3) status post stereotactic radiotherapy to the metastatic deposits in the lung completed 06/03/2013 under the care of Dr. Tammi Klippel. 4) Systemic chemotherapy with carboplatin for AUC of 5 and paclitaxel 175 mg/M2 every 3 weeks with Neulasta support. First dose 11/30/2013. Status post 3 cycles. Discontinued secondary to disease progression.  CURRENT THERAPY: Systemic chemotherapy with gemcitabine 1000 mg/M2 on days 1 and 8 every 3 weeks. Status post 2 cycles.  INTERVAL HISTORY: [name] 80 y.o. female returns to the clinic today for followup visit. She is tolerating her chemotherapy with single agent gemcitabine fairly well.with no significant adverse effects. The patient continues to have persistent peripheral neuropathy from the previous treatment with carboplatin and paclitaxel. She is taking Neurontin with little improvement. She notes difficulty putting on her earrings as well as picking up small things and buttoning her shirt. She is able to write her names and other writing without any difficulty. She denied having any significant fever or chills, no nausea or  vomiting. The patient denied having any significant chest pain, shortness breath, cough or hemoptysis. She still very active and working in her yard. She has fatigue at times. She continues to have difficulty sleeping and requests a refill for her Ambien.  MEDICAL HISTORY: Past Medical History  Diagnosis Date  . Dyslipidemia   . Arthritis     Hx of osteoarthritis  . Shortness of breath     WITH EXERTION   . COPD (chronic obstructive pulmonary disease)   . Anemia   . History of chemotherapy     carboplatin/taxol  . Lung mass   . 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 History of radiation therapy 01/13/11-02/20/11    abdomen    ALLERGIES:  is allergic to atarax.  MEDICATIONS:  Current Outpatient Prescriptions  Medication Sig Dispense Refill  . albuterol (PROVENTIL HFA;VENTOLIN HFA) 108 (90 BASE) MCG/ACT inhaler Inhale 3 puffs into the lungs every 4 (four) hours as needed. For shortness of breath     . calcium-vitamin D (OSCAL WITH D) 500-200 MG-UNIT per tablet Take 1 tablet by mouth every other day.     . cholecalciferol (VITAMIN D) 1000 UNITS tablet Take 1,000 Units by mouth daily.    . clobetasol cream (TEMOVATE) 0.05 %     . Cyanocobalamin (B-12) 100 MCG TABS Take 1 tablet by mouth as needed.     . DULoxetine (CYMBALTA) 30 MG capsule Take 1 capsule (30 mg total) by mouth daily. 30 capsule 3  . Ferrous Sulfate (IRON) 325 (65 FE) MG TABS Take 1 tablet by mouth daily.     . fish oil-omega-3 fatty acids 1000 MG capsule Take 1 g by mouth daily.     Marland Kitchen  gabapentin (NEURONTIN) 100 MG capsule Take 1 capsule (100 mg total) by mouth daily. 30 capsule 0  . Multiple Vitamins-Minerals (CENTRUM SILVER PO) Take 1 tablet by mouth daily.     . Multiple Vitamins-Minerals (ICAPS PO) Take 1 tablet by mouth every other day.     . prochlorperazine (COMPAZINE) 10 MG tablet Take 1 tablet (10 mg total) by mouth every 6 (six) hours as needed for nausea or vomiting. 30 tablet 1  . zolpidem (AMBIEN) 5 MG tablet Take  1 tablet (5 mg total) by mouth at bedtime as needed for sleep. 30 tablet 0   No current facility-administered medications for this visit.    SURGICAL HISTORY:  Past Surgical History  Procedure Laterality Date  . Esophagogastrectomy, j tube 11/08/2010    . Cholecystectomy    . Abdominal hysterectomy    . Cholecystectomy    . Cataract extraction      RIGHT EYE   . Port-a-cath removal  05/13/2011    Procedure: REMOVAL PORT-A-CATH;  Surgeon: Stark Klein, MD;  Location: [facility];  Service: General;  Laterality: N/A;  Removal port-a-cath.  . Portacath placement    . Eye surgery    . Bronchoscopy  08/28/11    rt upper lung mass bx  . Cardia resected  11/2010  . Wedge resection  09/18/11    right vats Dr. Lanelle Bal    REVIEW OF SYSTEMS:  Constitutional: negative Eyes: negative Ears, nose, mouth, throat, and face: negative Respiratory: negative Cardiovascular: negative Gastrointestinal: negative Genitourinary:negative Integument/breast: negative Hematologic/lymphatic: negative Musculoskeletal:negative Neurological: negative Behavioral/Psych: negative Endocrine: negative Allergic/Immunologic: negative   PHYSICAL EXAMINATION: General appearance: alert, cooperative and no distress Head: Normocephalic, without obvious abnormality, atraumatic Neck: no adenopathy, no JVD, supple, symmetrical, trachea midline and thyroid not enlarged, symmetric, no tenderness/mass/nodules Lymph nodes: Cervical, supraclavicular, and axillary nodes normal. Resp: clear to auscultation bilaterally Back: symmetric, no curvature. ROM normal. No CVA tenderness. Cardio: regular rate and rhythm, S1, S2 normal, no murmur, click, rub or gallop GI: soft, non-tender; bowel sounds normal; no masses,  no organomegaly Extremities: extremities normal, atraumatic, no cyanosis or edema Neurologic: Alert and oriented X 3, normal strength and tone. Normal symmetric reflexes. Normal coordination and gait  ECOG PERFORMANCE  STATUS: 1 - Symptomatic but completely ambulatory  Blood pressure 146/82, pulse 93, temperature 0 F (-17.8 C), resp. rate 17, height 4\' 11"  (1.499 m), weight 84 lb (38.102 kg).  LABORATORY DATA: Lab Results  Component Value Date   WBC 6.7 03/15/2014   HGB 10.4* 03/15/2014   HCT 32.9* 03/15/2014   MCV 95.6 03/15/2014   PLT 275 03/15/2014      Chemistry      Component Value Date/Time   NA 141 03/15/2014 0832   NA 141 02/22/2014 0852   NA 143 07/23/2011 0916   K 3.6 03/15/2014 0832   K 4.1 02/22/2014 0852   K 3.8 07/23/2011 0916   CL 106 02/22/2014 0852   CL 104 07/23/2011 0916   CO2 23 03/15/2014 0832   CO2 24 02/22/2014 0852   CO2 23 07/23/2011 0916   BUN 14.4 03/15/2014 0832   BUN 12 02/22/2014 0852   BUN 7 07/23/2011 0916   CREATININE 0.8 03/15/2014 0832   CREATININE 0.89 02/22/2014 0852   CREATININE 0.8 07/23/2011 0916      Component Value Date/Time   CALCIUM 10.9* 03/15/2014 0832   CALCIUM 9.3 02/22/2014 0852   CALCIUM 8.9 07/23/2011 0916   ALKPHOS 117 03/15/2014 0832   ALKPHOS 121*  02/22/2014 0852   ALKPHOS 109* 07/23/2011 0916   AST 24 03/15/2014 0832   AST 24 02/22/2014 0852   AST 31 07/23/2011 0916   ALT 12 03/15/2014 0832   ALT 12 02/22/2014 0852   ALT 22 07/23/2011 0916   BILITOT 0.51 03/15/2014 0832   BILITOT 0.2* 02/22/2014 0852   BILITOT 0.60 07/23/2011 0916       RADIOGRAPHIC STUDIES:  ASSESSMENT AND PLAN: This is a very pleasant 80 years old African American female with recurrent spindle cell carcinoma of the right lung consistent with sarcomatoid carcinoma, status post curative radiotherapy completed on 01/10/2013.  She also underwent a course of stereotactic radiotherapy to the pleural/subpleural area under the care of Dr. Tammi Klippel. Her recent scan showed evidence for disease progression with increased pleural and subpleural metastatic disease in the lateral right hemithorax. She underwent systemic chemotherapy with carboplatin and  paclitaxel is status post 3 cycles and tolerating her treatment fairly well with no significant adverse effects except for the peripheral neuropathy and this was discontinued secondary to disease progression. She is currently undergoing second line systemic chemotherapy with single agent gemcitabine status post 2 cycles. She is tolerating this treatment well.the patient was discussed with and also seen by Dr. Julien Nordmann. We will discontinue her Neurontin and start her on Cymbalta at 30 mg by mouth at bedtime. We will also discontinue her Ambien as she will have some somnolence from the Cymbalta. She will continue with weekly labs and follow-up in 3 weeks prior to cycle #4 with a restaging CT scan of her chest, abdomen and pelvis with contrast to reevaluate her disease.  She was advised to call immediately if she has any concerning symptoms in the interval.  The patient voices understanding of current disease status and treatment options and is in agreement with the current care plan.  All questions were answered. The patient knows to call the clinic with any problems, questions or concerns. We can certainly see the patient much sooner if necessary.  Disclaimer: This note was dictated with voice recognition software. Similar sounding words can inadvertently be transcribed and may not be corrected upon review.     Carlton Adam, PA-C 03/15/2014  ADDENDUM: Hematology/Oncology Attending: I had a face to face encounter with the patient. I recommended his care plan. This is a very pleasant 80 years old African-American female with recurrent sarcomatoid carcinoma of the lung currently undergoing systemic chemotherapy with single agent gemcitabine status post 2 cycles. She is tolerating her treatment well but she continues to have peripheral neuropathy from the prior treatment. We will start her on treatment with Cymbalta 30 mg by mouth daily at bedtime. She would proceed with cycle #3 today as scheduled.  The patient would come back for follow-up visit in 3 weeks for reevaluation after repeating CT scan of the chest, abdomen and pelvis. She was advised to call immediately if she has any concerning symptoms in the interval.  Disclaimer: This note was dictated with voice recognition software. Similar sounding words can inadvertently be transcribed and may be missed upon review. Eilleen Kempf., MD 03/18/2014

## 2014-03-17 NOTE — Patient Instructions (Signed)
Discontinue your Neurontin and Ambien. Start the Cymbalta 30 mg by mouth at bedtime. This should also help you rest as well as address your peripheral neuropathy symptoms Follow-up in 3 weeks with a restaging CT scan of your chest, abdomen and pelvis to reevaluate your disease

## 2014-03-22 ENCOUNTER — Other Ambulatory Visit (HOSPITAL_BASED_OUTPATIENT_CLINIC_OR_DEPARTMENT_OTHER): Payer: Medicare Other

## 2014-03-22 ENCOUNTER — Ambulatory Visit (HOSPITAL_BASED_OUTPATIENT_CLINIC_OR_DEPARTMENT_OTHER): Payer: Medicare Other

## 2014-03-22 DIAGNOSIS — C3411 Malignant neoplasm of upper lobe, right bronchus or lung: Secondary | ICD-10-CM

## 2014-03-22 DIAGNOSIS — C782 Secondary malignant neoplasm of pleura: Secondary | ICD-10-CM

## 2014-03-22 DIAGNOSIS — C3491 Malignant neoplasm of unspecified part of right bronchus or lung: Secondary | ICD-10-CM

## 2014-03-22 DIAGNOSIS — C801 Malignant (primary) neoplasm, unspecified: Secondary | ICD-10-CM

## 2014-03-22 DIAGNOSIS — Z5111 Encounter for antineoplastic chemotherapy: Secondary | ICD-10-CM

## 2014-03-22 LAB — COMPREHENSIVE METABOLIC PANEL (CC13)
ALT: 17 U/L (ref 0–55)
AST: 32 U/L (ref 5–34)
Albumin: 3.3 g/dL — ABNORMAL LOW (ref 3.5–5.0)
Alkaline Phosphatase: 125 U/L (ref 40–150)
Anion Gap: 11 mEq/L (ref 3–11)
BILIRUBIN TOTAL: 0.39 mg/dL (ref 0.20–1.20)
BUN: 11 mg/dL (ref 7.0–26.0)
CHLORIDE: 104 meq/L (ref 98–109)
CO2: 25 mEq/L (ref 22–29)
Calcium: 10.1 mg/dL (ref 8.4–10.4)
Creatinine: 0.8 mg/dL (ref 0.6–1.1)
Glucose: 108 mg/dl (ref 70–140)
Potassium: 3.5 mEq/L (ref 3.5–5.1)
Sodium: 140 mEq/L (ref 136–145)
TOTAL PROTEIN: 7.6 g/dL (ref 6.4–8.3)

## 2014-03-22 LAB — CBC WITH DIFFERENTIAL/PLATELET
BASO%: 1.2 % (ref 0.0–2.0)
Basophils Absolute: 0 10*3/uL (ref 0.0–0.1)
EOS%: 2.3 % (ref 0.0–7.0)
Eosinophils Absolute: 0.1 10*3/uL (ref 0.0–0.5)
HCT: 31.3 % — ABNORMAL LOW (ref 34.8–46.6)
HGB: 10 g/dL — ABNORMAL LOW (ref 11.6–15.9)
LYMPH%: 18.4 % (ref 14.0–49.7)
MCH: 31.1 pg (ref 25.1–34.0)
MCHC: 31.9 g/dL (ref 31.5–36.0)
MCV: 97.2 fL (ref 79.5–101.0)
MONO#: 0.5 10*3/uL (ref 0.1–0.9)
MONO%: 12 % (ref 0.0–14.0)
NEUT%: 66.1 % (ref 38.4–76.8)
NEUTROS ABS: 2.6 10*3/uL (ref 1.5–6.5)
Platelets: 214 10*3/uL (ref 145–400)
RBC: 3.21 10*6/uL — ABNORMAL LOW (ref 3.70–5.45)
RDW: 18.6 % — ABNORMAL HIGH (ref 11.2–14.5)
WBC: 3.9 10*3/uL (ref 3.9–10.3)
lymph#: 0.7 10*3/uL — ABNORMAL LOW (ref 0.9–3.3)

## 2014-03-22 MED ORDER — PROCHLORPERAZINE MALEATE 10 MG PO TABS
ORAL_TABLET | ORAL | Status: AC
  Filled 2014-03-22: qty 1

## 2014-03-22 MED ORDER — SODIUM CHLORIDE 0.9 % IV SOLN
Freq: Once | INTRAVENOUS | Status: AC
  Administered 2014-03-22: 09:00:00 via INTRAVENOUS

## 2014-03-22 MED ORDER — SODIUM CHLORIDE 0.9 % IV SOLN
1000.0000 mg/m2 | Freq: Once | INTRAVENOUS | Status: AC
  Administered 2014-03-22: 1292 mg via INTRAVENOUS
  Filled 2014-03-22: qty 33.98

## 2014-03-22 MED ORDER — PROCHLORPERAZINE MALEATE 10 MG PO TABS
10.0000 mg | ORAL_TABLET | Freq: Once | ORAL | Status: AC
  Administered 2014-03-22: 10 mg via ORAL

## 2014-03-22 NOTE — Patient Instructions (Signed)
[facility] Discharge Instructions for Patients Receiving Chemotherapy  Today you received the following chemotherapy agents Gemzar  To help prevent nausea and vomiting after your treatment, we encourage you to take your nausea medication as prescribed.   If you develop nausea and vomiting that is not controlled by your nausea medication, call the clinic.   BELOW ARE SYMPTOMS THAT SHOULD BE REPORTED IMMEDIATELY:  *FEVER GREATER THAN 100.5 F  *CHILLS WITH OR WITHOUT FEVER  NAUSEA AND VOMITING THAT IS NOT CONTROLLED WITH YOUR NAUSEA MEDICATION  *UNUSUAL SHORTNESS OF BREATH  *UNUSUAL BRUISING OR BLEEDING  TENDERNESS IN MOUTH AND THROAT WITH OR WITHOUT PRESENCE OF ULCERS  *URINARY PROBLEMS  *BOWEL PROBLEMS  UNUSUAL RASH Items with * indicate a potential emergency and should be followed up as soon as possible.  Feel free to call the clinic you have any questions or concerns. The clinic phone number is [phone].

## 2014-03-29 ENCOUNTER — Encounter (HOSPITAL_COMMUNITY): Payer: Self-pay

## 2014-03-29 ENCOUNTER — Ambulatory Visit (HOSPITAL_COMMUNITY)
Admission: RE | Admit: 2014-03-29 | Discharge: 2014-03-29 | Disposition: A | Discharge status: Home / Self Care | Payer: Medicare Other | Source: Ambulatory Visit | Attending: Physician Assistant | Admitting: Physician Assistant

## 2014-03-29 ENCOUNTER — Telehealth: Payer: Self-pay | Admitting: *Deleted

## 2014-03-29 ENCOUNTER — Other Ambulatory Visit (HOSPITAL_BASED_OUTPATIENT_CLINIC_OR_DEPARTMENT_OTHER): Payer: Medicare Other

## 2014-03-29 DIAGNOSIS — C3411 Malignant neoplasm of upper lobe, right bronchus or lung: Secondary | ICD-10-CM

## 2014-03-29 DIAGNOSIS — C3491 Malignant neoplasm of unspecified part of right bronchus or lung: Secondary | ICD-10-CM

## 2014-03-29 DIAGNOSIS — R0602 Shortness of breath: Secondary | ICD-10-CM | POA: Diagnosis not present

## 2014-03-29 DIAGNOSIS — Z9221 Personal history of antineoplastic chemotherapy: Secondary | ICD-10-CM | POA: Insufficient documentation

## 2014-03-29 DIAGNOSIS — Z923 Personal history of irradiation: Secondary | ICD-10-CM | POA: Insufficient documentation

## 2014-03-29 DIAGNOSIS — C801 Malignant (primary) neoplasm, unspecified: Secondary | ICD-10-CM

## 2014-03-29 LAB — COMPREHENSIVE METABOLIC PANEL (CC13)
ALT: 19 U/L (ref 0–55)
AST: 36 U/L — ABNORMAL HIGH (ref 5–34)
Albumin: 3.5 g/dL (ref 3.5–5.0)
Alkaline Phosphatase: 126 U/L (ref 40–150)
Anion Gap: 11 meq/L (ref 3–11)
BUN: 15.2 mg/dL (ref 7.0–26.0)
CO2: 26 meq/L (ref 22–29)
Calcium: 10.8 mg/dL — ABNORMAL HIGH (ref 8.4–10.4)
Chloride: 100 meq/L (ref 98–109)
Creatinine: 1 mg/dL (ref 0.6–1.1)
Glucose: 139 mg/dL (ref 70–140)
Potassium: 4 meq/L (ref 3.5–5.1)
Sodium: 138 meq/L (ref 136–145)
Total Bilirubin: 0.37 mg/dL (ref 0.20–1.20)
Total Protein: 8.1 g/dL (ref 6.4–8.3)

## 2014-03-29 LAB — CBC WITH DIFFERENTIAL/PLATELET
BASO%: 1.2 % (ref 0.0–2.0)
Basophils Absolute: 0 10*3/uL (ref 0.0–0.1)
EOS%: 2.3 % (ref 0.0–7.0)
Eosinophils Absolute: 0 10*3/uL (ref 0.0–0.5)
HEMATOCRIT: 32.7 % — AB (ref 34.8–46.6)
HGB: 10.7 g/dL — ABNORMAL LOW (ref 11.6–15.9)
LYMPH#: 0.6 10*3/uL — AB (ref 0.9–3.3)
LYMPH%: 35.1 % (ref 14.0–49.7)
MCH: 31 pg (ref 25.1–34.0)
MCHC: 32.7 g/dL (ref 31.5–36.0)
MCV: 94.8 fL (ref 79.5–101.0)
MONO#: 0.3 10*3/uL (ref 0.1–0.9)
MONO%: 14.6 % — ABNORMAL HIGH (ref 0.0–14.0)
NEUT#: 0.8 10*3/uL — ABNORMAL LOW (ref 1.5–6.5)
NEUT%: 46.8 % (ref 38.4–76.8)
NRBC: 3 % — AB (ref 0–0)
Platelets: 146 10*3/uL (ref 145–400)
RBC: 3.45 10*6/uL — AB (ref 3.70–5.45)
RDW: 17.5 % — ABNORMAL HIGH (ref 11.2–14.5)
WBC: 1.7 10*3/uL — ABNORMAL LOW (ref 3.9–10.3)

## 2014-03-29 MED ORDER — IOHEXOL 300 MG/ML  SOLN
100.0000 mL | Freq: Once | INTRAMUSCULAR | Status: AC | PRN
  Administered 2014-03-29: 65 mL via INTRAVENOUS

## 2014-03-29 NOTE — Telephone Encounter (Signed)
Verbal order received and read back from Dr. Julien Nordmann for patient to take Cymbalta at bedtime.  Called [name] at home, instructions given.  Reports she doesn't sleep at night.  Lies down at 7:30 pm so this is the time this nurse instructed to take Cymbalta.  Shared that she has to empty her bladder a lot at night.  Discussed drinking water from the time she wakes up until about 5:30 pm to be able to empty bladder.  Asked about nausea and has compazine but doesn't use this.  Advised to take only if needed and if she is not nauseated she doesn't need to take this.   Teach back method used.  She will try cymbalta at bedtime and tell us at f/u if things improve.

## 2014-03-29 NOTE — Telephone Encounter (Signed)
Kim with [name] CT called after patient CT C?A/P.  Spoke with Ms. Schelling who is nauseated with headache.  Reports "Nausea since I started the Cymbalta.  I am not depressed and am not sure why I am taking this.  I vomit a half cup of phlegm after taking Cymbalta in the morning.  I need to know if I should continue taking this medicine.  I usually take it after I eat and have not taken yet today."  Will notify providers.  Asked that I give her thirty minutes to get home to call her (home number [phone]).   Noted Cymbalta started for peripheral neuropathy and gabapentin was discontinued.

## 2014-04-05 ENCOUNTER — Encounter: Payer: Self-pay | Admitting: Physician Assistant

## 2014-04-05 ENCOUNTER — Telehealth: Payer: Self-pay | Admitting: Internal Medicine

## 2014-04-05 ENCOUNTER — Other Ambulatory Visit (HOSPITAL_COMMUNITY)
Admission: RE | Admit: 2014-04-05 | Discharge: 2014-04-05 | Disposition: A | Discharge status: Home / Self Care | Payer: Medicare Other | Source: Ambulatory Visit | Attending: Internal Medicine | Admitting: Internal Medicine

## 2014-04-05 ENCOUNTER — Ambulatory Visit (HOSPITAL_BASED_OUTPATIENT_CLINIC_OR_DEPARTMENT_OTHER): Payer: Medicare Other | Admitting: Physician Assistant

## 2014-04-05 ENCOUNTER — Other Ambulatory Visit (HOSPITAL_BASED_OUTPATIENT_CLINIC_OR_DEPARTMENT_OTHER): Payer: Medicare Other

## 2014-04-05 ENCOUNTER — Ambulatory Visit: Payer: Medicare Other

## 2014-04-05 VITALS — BP 132/82 | HR 90 | Temp 97.5°F | Resp 18 | Ht 59.0 in | Wt 77.5 lb

## 2014-04-05 DIAGNOSIS — C3411 Malignant neoplasm of upper lobe, right bronchus or lung: Secondary | ICD-10-CM

## 2014-04-05 DIAGNOSIS — C7951 Secondary malignant neoplasm of bone: Secondary | ICD-10-CM

## 2014-04-05 DIAGNOSIS — C3491 Malignant neoplasm of unspecified part of right bronchus or lung: Secondary | ICD-10-CM

## 2014-04-05 DIAGNOSIS — C801 Malignant (primary) neoplasm, unspecified: Secondary | ICD-10-CM

## 2014-04-05 DIAGNOSIS — C782 Secondary malignant neoplasm of pleura: Secondary | ICD-10-CM

## 2014-04-05 LAB — COMPREHENSIVE METABOLIC PANEL (CC13)
ALBUMIN: 3.2 g/dL — AB (ref 3.5–5.0)
ALK PHOS: 106 U/L (ref 40–150)
ALT: 11 U/L (ref 0–55)
AST: 21 U/L (ref 5–34)
Anion Gap: 10 mEq/L (ref 3–11)
BUN: 13.4 mg/dL (ref 7.0–26.0)
CALCIUM: 10.2 mg/dL (ref 8.4–10.4)
CHLORIDE: 105 meq/L (ref 98–109)
CO2: 25 mEq/L (ref 22–29)
Creatinine: 0.8 mg/dL (ref 0.6–1.1)
Glucose: 100 mg/dl (ref 70–140)
POTASSIUM: 3.8 meq/L (ref 3.5–5.1)
SODIUM: 140 meq/L (ref 136–145)
TOTAL PROTEIN: 7.2 g/dL (ref 6.4–8.3)
Total Bilirubin: 0.39 mg/dL (ref 0.20–1.20)

## 2014-04-05 LAB — CBC WITH DIFFERENTIAL/PLATELET
BASO%: 1 % (ref 0.0–2.0)
Basophils Absolute: 0.1 10*3/uL (ref 0.0–0.1)
EOS%: 2.7 % (ref 0.0–7.0)
Eosinophils Absolute: 0.1 10*3/uL (ref 0.0–0.5)
HEMATOCRIT: 30.8 % — AB (ref 34.8–46.6)
HGB: 9.8 g/dL — ABNORMAL LOW (ref 11.6–15.9)
LYMPH%: 23.2 % (ref 14.0–49.7)
MCH: 31.1 pg (ref 25.1–34.0)
MCHC: 31.8 g/dL (ref 31.5–36.0)
MCV: 97.8 fL (ref 79.5–101.0)
MONO#: 0.6 10*3/uL (ref 0.1–0.9)
MONO%: 12.1 % (ref 0.0–14.0)
NEUT#: 2.9 10*3/uL (ref 1.5–6.5)
NEUT%: 61 % (ref 38.4–76.8)
NRBC: 0 % (ref 0–0)
Platelets: 208 10*3/uL (ref 145–400)
RBC: 3.15 10*6/uL — ABNORMAL LOW (ref 3.70–5.45)
RDW: 18.5 % — AB (ref 11.2–14.5)
WBC: 4.8 10*3/uL (ref 3.9–10.3)
lymph#: 1.1 10*3/uL (ref 0.9–3.3)

## 2014-04-05 MED ORDER — HYDROCODONE-ACETAMINOPHEN 5-325 MG PO TABS: 1.0000 | ORAL_TABLET | Freq: Four times a day (QID) | ORAL | Status: DC | PRN

## 2014-04-05 MED ORDER — FLUCONAZOLE 100 MG PO TABS: 100.0000 mg | ORAL_TABLET | Freq: Every day | ORAL | Status: DC

## 2014-04-05 NOTE — Telephone Encounter (Signed)
gv and printed appt sched and avs for pt for Dec..

## 2014-04-05 NOTE — Patient Instructions (Signed)
Your restaging CT scan showed evidence for disease progression We are testing your biopsy tissue to see if you will benefit from a new type of treatment Take the pain medication one half to one tablet by mouth every 6 hours as needed for pain relief Follow up in 1 week

## 2014-04-05 NOTE — Progress Notes (Signed)
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  2) locally advanced adenocarcinoma of the stomach status post resection in July of 2002 followed by concurrent chemoradiation under the care of Dr. Lisbeth Renshaw and Dr. Jamse Arn.  PRIOR THERAPY: 1) status post wedge resection of the right upper lobe under the care of Dr. Servando Snare on 09/16/2011 and the final pathology was consistent with a spindle cell carcinoma. 2) status post curative radiotherapy to the recurrent disease in the right lung under the care of Dr. Tammi Klippel completed on 01/10/2013. 3) status post stereotactic radiotherapy to the metastatic deposits in the lung completed 06/03/2013 under the care of Dr. Tammi Klippel. 4) Systemic chemotherapy with carboplatin for AUC of 5 and paclitaxel 175 mg/M2 every 3 weeks with Neulasta support. First dose 11/30/2013. Status post 3 cycles. Discontinued secondary to disease progression.  CURRENT THERAPY: Systemic chemotherapy with gemcitabine 1000 mg/M2 on days 1 and 8 every 3 weeks. Status post 3 cycles.  INTERVAL HISTORY: [name] 80 y.o. female returns to the clinic today for followup visit. She is tolerating her chemotherapy with single agent gemcitabine fairly well.with no significant adverse effects. The patient continues to have persistent peripheral neuropathy from the previous treatment with carboplatin and paclitaxel. She is taking Neurontin with little improvement. She complains of right rib cage pain. She rates it a "5" on a 0-10 scale.She denied having any significant fever or chills, no nausea or vomiting. The patient denied having any significant chest pain, shortness breath, cough or hemoptysis. She  still very active and working in her yard. She has fatigue at times. She recently had a restaging CT scan of her chest, abdomen and pelvis and presents to discuss the results.  MEDICAL HISTORY: Past Medical History  Diagnosis Date  . Dyslipidemia   . Arthritis     Hx of osteoarthritis  . Shortness of breath     WITH EXERTION   . COPD (chronic obstructive pulmonary disease)   . Anemia   . History of chemotherapy     carboplatin/taxol  . Lung mass   . 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 History of radiation therapy 01/13/11-02/20/11    abdomen    ALLERGIES:  is allergic to atarax.  MEDICATIONS:  Current Outpatient Prescriptions  Medication Sig Dispense Refill  . albuterol (PROVENTIL HFA;VENTOLIN HFA) 108 (90 BASE) MCG/ACT inhaler Inhale 3 puffs into the lungs every 4 (four) hours as needed. For shortness of breath     . calcium-vitamin D (OSCAL WITH D) 500-200 MG-UNIT per tablet Take 1 tablet by mouth every other day.     . cholecalciferol (VITAMIN D) 1000 UNITS tablet Take 1,000 Units by mouth daily.    . clobetasol cream (TEMOVATE) 0.05 %     . Cyanocobalamin (B-12) 100 MCG TABS Take 1 tablet by mouth as needed.     . DULoxetine (CYMBALTA) 30 MG capsule Take 1 capsule (30 mg total) by mouth daily. 30 capsule 3  . Ferrous Sulfate (IRON) 325 (65 FE) MG TABS Take 1 tablet by mouth daily.     . fish oil-omega-3 fatty acids 1000 MG capsule Take 1 g by mouth daily.     Marland Kitchen gabapentin (NEURONTIN) 100 MG capsule Take 1 capsule (  100 mg total) by mouth daily. 30 capsule 0  . Multiple Vitamins-Minerals (CENTRUM SILVER PO) Take 1 tablet by mouth daily.     . Multiple Vitamins-Minerals (ICAPS PO) Take 1 tablet by mouth every other day.     . prochlorperazine (COMPAZINE) 10 MG tablet Take 1 tablet (10 mg total) by mouth every 6 (six) hours as needed for nausea or vomiting. 30 tablet 1  . zolpidem (AMBIEN) 5 MG tablet Take 1 tablet (5 mg total) by mouth at bedtime as needed for sleep. 30 tablet 0  .  fluconazole (DIFLUCAN) 100 MG tablet Take 1 tablet (100 mg total) by mouth daily. 10 tablet 0  . HYDROcodone-acetaminophen (NORCO) 5-325 MG per tablet Take 1 tablet by mouth every 6 (six) hours as needed for moderate pain. 30 tablet 0   No current facility-administered medications for this visit.    SURGICAL HISTORY:  Past Surgical History  Procedure Laterality Date  . Esophagogastrectomy, j tube 11/08/2010    . Cholecystectomy    . Abdominal hysterectomy    . Cholecystectomy    . Cataract extraction      RIGHT EYE   . Port-a-cath removal  05/13/2011    Procedure: REMOVAL PORT-A-CATH;  Surgeon: Stark Klein, MD;  Location: [facility];  Service: General;  Laterality: N/A;  Removal port-a-cath.  . Portacath placement    . Eye surgery    . Bronchoscopy  08/28/11    rt upper lung mass bx  . Cardia resected  11/2010  . Wedge resection  09/18/11    right vats Dr. Lanelle Bal    REVIEW OF SYSTEMS:  Constitutional: negative Eyes: negative Ears, nose, mouth, throat, and face: negative Respiratory: negative Cardiovascular: negative Gastrointestinal: negative Genitourinary:negative Integument/breast: negative Hematologic/lymphatic: negative Musculoskeletal:negative Neurological: negative Behavioral/Psych: negative Endocrine: negative Allergic/Immunologic: negative   PHYSICAL EXAMINATION: General appearance: alert, cooperative and no distress Head: Normocephalic, without obvious abnormality, atraumatic Neck: no adenopathy, no JVD, supple, symmetrical, trachea midline and thyroid not enlarged, symmetric, no tenderness/mass/nodules Lymph nodes: Cervical, supraclavicular, and axillary nodes normal. Resp: clear to auscultation bilaterally Back: symmetric, no curvature. ROM normal. No CVA tenderness. Cardio: regular rate and rhythm, S1, S2 normal, no murmur, click, rub or gallop GI: soft, non-tender; bowel sounds normal; no masses,  no organomegaly Extremities: extremities normal, atraumatic,  no cyanosis or edema Neurologic: Alert and oriented X 3, normal strength and tone. Normal symmetric reflexes. Normal coordination and gait Mouth: reveals thrush ECOG PERFORMANCE STATUS: 1 - Symptomatic but completely ambulatory  Blood pressure 132/82, pulse 90, temperature 97.5 F (36.4 C), temperature source Oral, resp. rate 18, height 4' 11" (1.499 m), weight 77 lb 8 oz (35.154 kg).  LABORATORY DATA: Lab Results  Component Value Date   WBC 4.8 04/05/2014   HGB 9.8* 04/05/2014   HCT 30.8* 04/05/2014   MCV 97.8 04/05/2014   PLT 208 04/05/2014      Chemistry      Component Value Date/Time   NA 140 04/05/2014 0934   NA 141 02/22/2014 0852   NA 143 07/23/2011 0916   K 3.8 04/05/2014 0934   K 4.1 02/22/2014 0852   K 3.8 07/23/2011 0916   CL 106 02/22/2014 0852   CL 104 07/23/2011 0916   CO2 25 04/05/2014 0934   CO2 24 02/22/2014 0852   CO2 23 07/23/2011 0916   BUN 13.4 04/05/2014 0934   BUN 12 02/22/2014 0852   BUN 7 07/23/2011 0916   CREATININE 0.8 04/05/2014 0934   CREATININE 0.89 02/22/2014  4580   CREATININE 0.8 07/23/2011 0916      Component Value Date/Time   CALCIUM 10.2 04/05/2014 0934   CALCIUM 9.3 02/22/2014 0852   CALCIUM 8.9 07/23/2011 0916   ALKPHOS 106 04/05/2014 0934   ALKPHOS 121* 02/22/2014 0852   ALKPHOS 109* 07/23/2011 0916   AST 21 04/05/2014 0934   AST 24 02/22/2014 0852   AST 31 07/23/2011 0916   ALT 11 04/05/2014 0934   ALT 12 02/22/2014 0852   ALT 22 07/23/2011 0916   BILITOT 0.39 04/05/2014 0934   BILITOT 0.2* 02/22/2014 0852   BILITOT 0.60 07/23/2011 0916       RADIOGRAPHIC STUDIES: Ct Chest W Contrast  03/29/2014   CLINICAL DATA:  80 year old female with recurrent spindle cell sarcomatoid carcinoma in the right lung status post right upper lobe wedge resection in 2013. Prior history of chemotherapy and radiation therapy, currently with undergoing chemotherapy. Shortness of breath on exertion.  EXAM: CT CHEST, ABDOMEN, AND PELVIS WITH  CONTRAST  TECHNIQUE: Multidetector CT imaging of the chest, abdomen and pelvis was performed following the standard protocol during bolus administration of intravenous contrast.  CONTRAST:  49m OMNIPAQUE IOHEXOL 300 MG/ML  SOLN  COMPARISON:  CT of the chest, abdomen and pelvis 01/31/2014.  FINDINGS: CT CHEST FINDINGS  Mediastinum: Heart size is normal. There is no significant pericardial fluid, thickening or pericardial calcification. There is atherosclerosis of the thoracic aorta, the great vessels of the mediastinum and the coronary arteries, including calcified atherosclerotic plaque in the left main, left anterior descending, left circumflex and right coronary arteries. Mildly enlarged right hilar lymph node measuring 1 cm in short axis. No other definite mediastinal or left hilar lymphadenopathy. Patulous esophagus, largely fluid filled. Surgical changes near the gastroesophageal junction.  Lungs/Pleura: Postoperative changes of wedge resection in the apex of the right upper lobe are noted. Extensive peripheral subpleural reticulation in the anterior and lateral aspect of the right lung, likely related to prior radiation therapy. The smaller pleural-based nodule noted on the prior examination currently measures approximately 1.8 x 1.4 cm (image 31 of series 2), slightly increased compared to the prior study. The larger heterogeneously enhancing pleural based mass in the lateral right hemithorax appears slightly larger than the prior examination, currently measuring up to 8.7 x 3.6 cm, and appears to be clearly invading the chest wall (best appreciated on image 33 of series 2), with some outward extension between the sixth and seventh ribs, and destructive changes in the sixth rib where there is a minimally displaced pathologic fracture, as well as the seventh rib. Extensive fibrotic changes are also noted in the right lower lobe where there is some mild cylindrical and varicose bronchiectasis. Mild diffuse  bronchial wall thickening. Mild centrilobular emphysema.  Musculoskeletal: Right-sided chest wall invasion with destructive changes in the right sixth and seventh ribs and pathologic fracture of the right sixth rib, as above. Old postthoracotomy changes in the right ribs redemonstrated.  CT ABDOMEN AND PELVIS FINDINGS  Hepatobiliary: Status post cholecystectomy. Mild intrahepatic biliary ductal dilatation is unchanged compared to the prior examination. Common bile duct is normal in caliber measuring 7 mm in the porta hepatis. This is presumably a "functional" bile duct dilatation in the setting of prior cholecystectomy. Dilated cystic duct remnant is unchanged and chronic. No suspicious cystic or solid hepatic lesions.  Pancreas: Slightly atrophied, but otherwise unremarkable in appearance.  Spleen: Unremarkable.  Adrenals/Urinary Tract: Sub cm low-attenuation lesions in the kidneys bilaterally are too small to definitively characterize. No  other suspicious appearing renal lesions are noted. No hydroureteronephrosis. Urinary bladder is unremarkable in appearance. Bilateral adrenal glands are normal in appearance.  Stomach/Bowel: Postoperative changes in a proximal stomach centered around the gastroesophageal junction again noted. No pathologic dilatation of small bowel or colon.  Vascular/Lymphatic: Extensive atherosclerosis throughout the abdominal and pelvic vasculature, without definite aneurysm or dissection. Probable high-grade stenosis of the proximal left external iliac artery (image 81 of series 2). No lymphadenopathy noted in the abdomen or pelvis.  Reproductive: Status post hysterectomy. Ovaries are not confidently identified may be surgically absent or atrophic.  Other: No significant volume of ascites.  No pneumoperitoneum.  Musculoskeletal: Multiple Schmorl's nodes in the lower lumbar spine incidentally noted. There are no aggressive appearing lytic or blastic lesions noted in the visualized portions of  the skeleton.  IMPRESSION: 1. Today's study demonstrates progression of disease in the chest, with enlargement of the previously described pleural-based lesions in the right hemithorax, now with definitive evidence of chest wall invasion, including rib involvement in the sixth and seventh ribs where there is a minimally displaced pathologic fracture of the lateral sixth rib. 2. Additional incidental findings, as above, similar prior examinations.   Electronically Signed   By: Vinnie Langton M.D.   On: 03/29/2014 11:39   Ct Abdomen Pelvis W Contrast  03/29/2014   CLINICAL DATA:  80 year old female with recurrent spindle cell sarcomatoid carcinoma in the right lung status post right upper lobe wedge resection in 2013. Prior history of chemotherapy and radiation therapy, currently with undergoing chemotherapy. Shortness of breath on exertion.  EXAM: CT CHEST, ABDOMEN, AND PELVIS WITH CONTRAST  TECHNIQUE: Multidetector CT imaging of the chest, abdomen and pelvis was performed following the standard protocol during bolus administration of intravenous contrast.  CONTRAST:  69m OMNIPAQUE IOHEXOL 300 MG/ML  SOLN  COMPARISON:  CT of the chest, abdomen and pelvis 01/31/2014.  FINDINGS: CT CHEST FINDINGS  Mediastinum: Heart size is normal. There is no significant pericardial fluid, thickening or pericardial calcification. There is atherosclerosis of the thoracic aorta, the great vessels of the mediastinum and the coronary arteries, including calcified atherosclerotic plaque in the left main, left anterior descending, left circumflex and right coronary arteries. Mildly enlarged right hilar lymph node measuring 1 cm in short axis. No other definite mediastinal or left hilar lymphadenopathy. Patulous esophagus, largely fluid filled. Surgical changes near the gastroesophageal junction.  Lungs/Pleura: Postoperative changes of wedge resection in the apex of the right upper lobe are noted. Extensive peripheral subpleural  reticulation in the anterior and lateral aspect of the right lung, likely related to prior radiation therapy. The smaller pleural-based nodule noted on the prior examination currently measures approximately 1.8 x 1.4 cm (image 31 of series 2), slightly increased compared to the prior study. The larger heterogeneously enhancing pleural based mass in the lateral right hemithorax appears slightly larger than the prior examination, currently measuring up to 8.7 x 3.6 cm, and appears to be clearly invading the chest wall (best appreciated on image 33 of series 2), with some outward extension between the sixth and seventh ribs, and destructive changes in the sixth rib where there is a minimally displaced pathologic fracture, as well as the seventh rib. Extensive fibrotic changes are also noted in the right lower lobe where there is some mild cylindrical and varicose bronchiectasis. Mild diffuse bronchial wall thickening. Mild centrilobular emphysema.  Musculoskeletal: Right-sided chest wall invasion with destructive changes in the right sixth and seventh ribs and pathologic fracture of the right sixth  rib, as above. Old postthoracotomy changes in the right ribs redemonstrated.  CT ABDOMEN AND PELVIS FINDINGS  Hepatobiliary: Status post cholecystectomy. Mild intrahepatic biliary ductal dilatation is unchanged compared to the prior examination. Common bile duct is normal in caliber measuring 7 mm in the porta hepatis. This is presumably a "functional" bile duct dilatation in the setting of prior cholecystectomy. Dilated cystic duct remnant is unchanged and chronic. No suspicious cystic or solid hepatic lesions.  Pancreas: Slightly atrophied, but otherwise unremarkable in appearance.  Spleen: Unremarkable.  Adrenals/Urinary Tract: Sub cm low-attenuation lesions in the kidneys bilaterally are too small to definitively characterize. No other suspicious appearing renal lesions are noted. No hydroureteronephrosis. Urinary  bladder is unremarkable in appearance. Bilateral adrenal glands are normal in appearance.  Stomach/Bowel: Postoperative changes in a proximal stomach centered around the gastroesophageal junction again noted. No pathologic dilatation of small bowel or colon.  Vascular/Lymphatic: Extensive atherosclerosis throughout the abdominal and pelvic vasculature, without definite aneurysm or dissection. Probable high-grade stenosis of the proximal left external iliac artery (image 81 of series 2). No lymphadenopathy noted in the abdomen or pelvis.  Reproductive: Status post hysterectomy. Ovaries are not confidently identified may be surgically absent or atrophic.  Other: No significant volume of ascites.  No pneumoperitoneum.  Musculoskeletal: Multiple Schmorl's nodes in the lower lumbar spine incidentally noted. There are no aggressive appearing lytic or blastic lesions noted in the visualized portions of the skeleton.  IMPRESSION: 1. Today's study demonstrates progression of disease in the chest, with enlargement of the previously described pleural-based lesions in the right hemithorax, now with definitive evidence of chest wall invasion, including rib involvement in the sixth and seventh ribs where there is a minimally displaced pathologic fracture of the lateral sixth rib. 2. Additional incidental findings, as above, similar prior examinations.   Electronically Signed   By: Vinnie Langton M.D.   On: 03/29/2014 11:39    ASSESSMENT AND PLAN: This is a very pleasant 80 years old [place] American female with recurrent spindle cell carcinoma of the right lung consistent with sarcomatoid carcinoma, status post curative radiotherapy completed on 01/10/2013.  She also underwent a course of stereotactic radiotherapy to the pleural/subpleural area under the care of Dr. Tammi Klippel. Her recent scan showed evidence for disease progression with increased pleural and subpleural metastatic disease in the lateral right hemithorax. She  underwent systemic chemotherapy with carboplatin and paclitaxel is status post 3 cycles and tolerating her treatment fairly well with no significant adverse effects except for the peripheral neuropathy and this was discontinued secondary to disease progression. She is currently undergoing second line systemic chemotherapy with single agent gemcitabine status post 3 cycles. She is tolerating this treatment well.the patient was discussed with and also seen by Dr. Julien Nordmann. She will continue on Cymbalta 30 mg at bedtime for her peripheral neuropathy symptoms. Her restaging CT scan showed evidence for progression in the chest with enlargement of the previously described pleural-based lesion similar right hemithorax with definitive evidence of chest wall invasion including rib involvement of the sixth and seventh ribs where there is minimally displaced pathologic fracture of the lateral sixth rib.these Route results were discussed with the patient. We will discontinue her current chemotherapy with gemcitabine. Spoke with Kristi in our pathology department we will have her tissue block sent for PDL 1 expression concentration of for consideration for treatment with Keytruda. For pain management she was given a prescription for  Norco 5/325 mg, half to one tablet by mouth every 6 hours as needed  for pain, a total of 30 tablets with no refill. For the oral candidiasis a prescription for Diflucan was sent her pharmacy of record via E scribe. She will follow up in approximately one week to discuss the results of her PDL1 expression determination and further treatment options.st, abdomen and pelvis with contrast to reevaluate her disease.  She was advised to call immediately if she has any concerning symptoms in the interval.  The patient voices understanding of current disease status and treatment options and is in agreement with the current care plan.  All questions were answered. The patient knows to call the clinic  with any problems, questions or concerns. We can certainly see the patient much sooner if necessary.  Disclaimer: This note was dictated with voice recognition software. Similar sounding words can inadvertently be transcribed and may not be corrected upon review.     Carlton Adam, PA-C 04/05/2014  ADDENDUM: Hematology/Oncology Attending: I had a face to face encounter with the patient today. I recommended her care plan. This is a very pleasant 80 years old African-American female with recurrent sarcomatoid carcinoma of the lung recently treated with 3 cycles of single agent gemcitabine but unfortunately her recent scan showed continuous evidence for disease progression. I had a lengthy discussion with the patient today about her current condition and treatment options. I recommended for the patient to have her tissue block tested for PDL 1 expression. If the patient has positive test, she would be consider for treatment with continued to do or Nivolumab. She will come back for follow-up visit in one week for reevaluation and more detailed discussion of her treatment options based on the PDL 1 test. The patient was given a refill for her pain medication. She was advised to call immediately if she has any concerning symptoms in the interval.  Disclaimer: This note was dictated with voice recognition software. Similar sounding words can inadvertently be transcribed and may be missed upon review. Eilleen Kempf., MD 04/05/2014

## 2014-04-10 ENCOUNTER — Encounter (HOSPITAL_COMMUNITY): Payer: Self-pay

## 2014-04-12 ENCOUNTER — Ambulatory Visit: Payer: Medicare Other

## 2014-04-13 ENCOUNTER — Telehealth: Payer: Self-pay | Admitting: Internal Medicine

## 2014-04-13 ENCOUNTER — Ambulatory Visit: Payer: Medicare Other | Admitting: Physician Assistant

## 2014-04-13 ENCOUNTER — Other Ambulatory Visit: Payer: Medicare Other

## 2014-04-13 NOTE — Telephone Encounter (Signed)
per pt moved appt from 12/10 to 12/14 - AJ aware.

## 2014-04-17 ENCOUNTER — Other Ambulatory Visit (HOSPITAL_BASED_OUTPATIENT_CLINIC_OR_DEPARTMENT_OTHER): Payer: Medicare Other

## 2014-04-17 ENCOUNTER — Ambulatory Visit (HOSPITAL_BASED_OUTPATIENT_CLINIC_OR_DEPARTMENT_OTHER): Payer: Medicare Other | Admitting: Physician Assistant

## 2014-04-17 ENCOUNTER — Telehealth: Payer: Self-pay | Admitting: Internal Medicine

## 2014-04-17 ENCOUNTER — Encounter: Payer: Self-pay | Admitting: Physician Assistant

## 2014-04-17 VITALS — BP 154/96 | HR 93 | Temp 98.4°F | Resp 18 | Ht 59.0 in | Wt 76.8 lb

## 2014-04-17 DIAGNOSIS — G620 Drug-induced polyneuropathy: Secondary | ICD-10-CM

## 2014-04-17 DIAGNOSIS — C801 Malignant (primary) neoplasm, unspecified: Secondary | ICD-10-CM

## 2014-04-17 DIAGNOSIS — C3491 Malignant neoplasm of unspecified part of right bronchus or lung: Secondary | ICD-10-CM

## 2014-04-17 DIAGNOSIS — C3411 Malignant neoplasm of upper lobe, right bronchus or lung: Secondary | ICD-10-CM

## 2014-04-17 DIAGNOSIS — C7989 Secondary malignant neoplasm of other specified sites: Secondary | ICD-10-CM

## 2014-04-17 DIAGNOSIS — Z79899 Other long term (current) drug therapy: Secondary | ICD-10-CM

## 2014-04-17 LAB — COMPREHENSIVE METABOLIC PANEL (CC13)
ALBUMIN: 3.2 g/dL — AB (ref 3.5–5.0)
ALT: 15 U/L (ref 0–55)
AST: 31 U/L (ref 5–34)
Alkaline Phosphatase: 125 U/L (ref 40–150)
Anion Gap: 14 mEq/L — ABNORMAL HIGH (ref 3–11)
BUN: 11.2 mg/dL (ref 7.0–26.0)
CALCIUM: 10.3 mg/dL (ref 8.4–10.4)
CHLORIDE: 103 meq/L (ref 98–109)
CO2: 23 meq/L (ref 22–29)
Creatinine: 0.8 mg/dL (ref 0.6–1.1)
EGFR: 79 mL/min/{1.73_m2} — ABNORMAL LOW (ref >90)
GLUCOSE: 87 mg/dL (ref 70–140)
Potassium: 3.9 mEq/L (ref 3.5–5.1)
Sodium: 141 mEq/L (ref 136–145)
Total Bilirubin: 0.32 mg/dL (ref 0.20–1.20)
Total Protein: 7.4 g/dL (ref 6.4–8.3)

## 2014-04-17 LAB — CBC WITH DIFFERENTIAL/PLATELET
BASO%: 0.9 % (ref 0.0–2.0)
BASOS ABS: 0.1 10*3/uL (ref 0.0–0.1)
EOS%: 1.2 % (ref 0.0–7.0)
Eosinophils Absolute: 0.1 10*3/uL (ref 0.0–0.5)
HEMATOCRIT: 35.2 % (ref 34.8–46.6)
HEMOGLOBIN: 11.4 g/dL — AB (ref 11.6–15.9)
LYMPH%: 20.8 % (ref 14.0–49.7)
MCH: 31.2 pg (ref 25.1–34.0)
MCHC: 32.4 g/dL (ref 31.5–36.0)
MCV: 96.4 fL (ref 79.5–101.0)
MONO#: 0.9 10*3/uL (ref 0.1–0.9)
MONO%: 11.7 % (ref 0.0–14.0)
NEUT#: 5 10*3/uL (ref 1.5–6.5)
NEUT%: 65.4 % (ref 38.4–76.8)
Platelets: 259 10*3/uL (ref 145–400)
RBC: 3.65 10*6/uL — ABNORMAL LOW (ref 3.70–5.45)
RDW: 17.1 % — ABNORMAL HIGH (ref 11.2–14.5)
WBC: 7.6 10*3/uL (ref 3.9–10.3)
lymph#: 1.6 10*3/uL (ref 0.9–3.3)
nRBC: 0 % (ref 0–0)

## 2014-04-17 NOTE — Telephone Encounter (Signed)
Pt confirmed labs/ov per 12/14 POF, gave pt AVS..... KJ, sent msg to add chemo °

## 2014-04-17 NOTE — Progress Notes (Addendum)
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  2) locally advanced adenocarcinoma of the stomach status post resection in July of 2002 followed by concurrent chemoradiation under the care of Dr. Lisbeth Renshaw and Dr. Jamse Arn.  PRIOR THERAPY: 1) status post wedge resection of the right upper lobe under the care of Dr. Servando Snare on 09/16/2011 and the final pathology was consistent with a spindle cell carcinoma. 2) status post curative radiotherapy to the recurrent disease in the right lung under the care of Dr. Tammi Klippel completed on 01/10/2013. 3) status post stereotactic radiotherapy to the metastatic deposits in the lung completed 06/03/2013 under the care of Dr. Tammi Klippel. 4) Systemic chemotherapy with carboplatin for AUC of 5 and paclitaxel 175 mg/M2 every 3 weeks with Neulasta support. First dose 11/30/2013. Status post 3 cycles. Discontinued secondary to disease progression.  CURRENT THERAPY: Systemic chemotherapy with gemcitabine 1000 mg/M2 on days 1 and 8 every 3 weeks. Status post 3 cycles.  INTERVAL HISTORY: [name] 80 y.o. female returns to the clinic today for followup visit. Her recent CT scan of the chest, abdomen and pelvis revealed evidence for disease progression. Her tissue block was sent off for PDL 1 expression determination. She presents to discuss the results and future treatment options. She rescheduled her appointment from last week to this week and she was not feeling well due to some constipation. This has resolved and she voices no other specific complaints today. he patient continues to have persistent peripheral neuropathy from the previous treatment with carboplatin and paclitaxel.  She is taking Neurontin with little improvement. She denied having any significant fever or chills, no nausea or vomiting. The patient denied having any significant chest pain, shortness breath, cough or hemoptysis. She still very active.  She has fatigue at times.   MEDICAL HISTORY: Past Medical History  Diagnosis Date  . Dyslipidemia   . Arthritis     Hx of osteoarthritis  . Shortness of breath     WITH EXERTION   . COPD (chronic obstructive pulmonary disease)   . Anemia   . History of chemotherapy     carboplatin/taxol  . Lung mass   . 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 History of radiation therapy 01/13/11-02/20/11    abdomen    ALLERGIES:  is allergic to atarax.  MEDICATIONS:  Current Outpatient Prescriptions  Medication Sig Dispense Refill  . albuterol (PROVENTIL HFA;VENTOLIN HFA) 108 (90 BASE) MCG/ACT inhaler Inhale 3 puffs into the lungs every 4 (four) hours as needed. For shortness of breath     . calcium-vitamin D (OSCAL WITH D) 500-200 MG-UNIT per tablet Take 1 tablet by mouth every other day.     . cholecalciferol (VITAMIN D) 1000 UNITS tablet Take 1,000 Units by mouth daily.    . clobetasol cream (TEMOVATE) 0.05 %     . Cyanocobalamin (B-12) 100 MCG TABS Take 1 tablet by mouth as needed.     . DULoxetine (CYMBALTA) 30 MG capsule Take 1 capsule (30 mg total) by mouth daily. 30 capsule 3  . Ferrous Sulfate (IRON) 325 (65 FE) MG TABS Take 1 tablet by mouth daily.     . fish oil-omega-3 fatty acids 1000 MG capsule Take 1 g by  mouth daily.     . fluconazole (DIFLUCAN) 100 MG tablet Take 1 tablet (100 mg total) by mouth daily. 10 tablet 0  . gabapentin (NEURONTIN) 100 MG capsule Take 1 capsule (100 mg total) by mouth daily. 30 capsule 0  . HYDROcodone-acetaminophen (NORCO) 5-325 MG per tablet Take 1 tablet by mouth every 6 (six) hours as needed for moderate pain. 30 tablet 0  . Multiple Vitamins-Minerals (CENTRUM SILVER PO) Take 1 tablet by mouth daily.     . Multiple  Vitamins-Minerals (ICAPS PO) Take 1 tablet by mouth every other day.     . prochlorperazine (COMPAZINE) 10 MG tablet Take 1 tablet (10 mg total) by mouth every 6 (six) hours as needed for nausea or vomiting. 30 tablet 1  . zolpidem (AMBIEN) 5 MG tablet Take 1 tablet (5 mg total) by mouth at bedtime as needed for sleep. 30 tablet 0   No current facility-administered medications for this visit.    SURGICAL HISTORY:  Past Surgical History  Procedure Laterality Date  . Esophagogastrectomy, j tube 11/08/2010    . Cholecystectomy    . Abdominal hysterectomy    . Cholecystectomy    . Cataract extraction      RIGHT EYE   . Port-a-cath removal  05/13/2011    Procedure: REMOVAL PORT-A-CATH;  Surgeon: Stark Klein, MD;  Location: [facility];  Service: General;  Laterality: N/A;  Removal port-a-cath.  . Portacath placement    . Eye surgery    . Bronchoscopy  08/28/11    rt upper lung mass bx  . Cardia resected  11/2010  . Wedge resection  09/18/11    right vats Dr. Lanelle Bal    REVIEW OF SYSTEMS:  Constitutional: positive for fatigue Eyes: negative Ears, nose, mouth, throat, and face: negative Respiratory: negative Cardiovascular: negative Gastrointestinal: negative Genitourinary:negative Integument/breast: negative Hematologic/lymphatic: negative Musculoskeletal:negative Neurological: positive for paresthesia Behavioral/Psych: negative Endocrine: negative Allergic/Immunologic: negative   PHYSICAL EXAMINATION: General appearance: alert, cooperative and no distress Head: Normocephalic, without obvious abnormality, atraumatic Neck: no adenopathy, no JVD, supple, symmetrical, trachea midline and thyroid not enlarged, symmetric, no tenderness/mass/nodules Lymph nodes: Cervical, supraclavicular, and axillary nodes normal. Resp: clear to auscultation bilaterally Back: symmetric, no curvature. ROM normal. No CVA tenderness. Cardio: regular rate and rhythm, S1, S2 normal, no murmur, click, rub  or gallop GI: soft, non-tender; bowel sounds normal; no masses,  no organomegaly Extremities: extremities normal, atraumatic, no cyanosis or edema Neurologic: Alert and oriented X 3, normal strength and tone. Normal symmetric reflexes. Normal coordination and gait Mouth: Clear, no evidence of thrush or mucositis  ECOG PERFORMANCE STATUS: 1 - Symptomatic but completely ambulatory  Blood pressure 154/96, pulse 93, temperature 98.4 F (36.9 C), temperature source Oral, resp. rate 18, height 4\' 11"  (1.499 m), weight 76 lb 12.8 oz (34.836 kg), SpO2 93 %.  LABORATORY DATA: Lab Results  Component Value Date   WBC 7.6 04/17/2014   HGB 11.4* 04/17/2014   HCT 35.2 04/17/2014   MCV 96.4 04/17/2014   PLT 259 04/17/2014      Chemistry      Component Value Date/Time   NA 141 04/17/2014 1357   NA 141 02/22/2014 0852   NA 143 07/23/2011 0916   K 3.9 04/17/2014 1357   K 4.1 02/22/2014 0852   K 3.8 07/23/2011 0916   CL 106 02/22/2014 0852   CL 104 07/23/2011 0916   CO2 23 04/17/2014 1357   CO2 24 02/22/2014 0852   CO2 23 07/23/2011 0916  BUN 11.2 04/17/2014 1357   BUN 12 02/22/2014 0852   BUN 7 07/23/2011 0916   CREATININE 0.8 04/17/2014 1357   CREATININE 0.89 02/22/2014 0852   CREATININE 0.8 07/23/2011 0916      Component Value Date/Time   CALCIUM 10.3 04/17/2014 1357   CALCIUM 9.3 02/22/2014 0852   CALCIUM 8.9 07/23/2011 0916   ALKPHOS 125 04/17/2014 1357   ALKPHOS 121* 02/22/2014 0852   ALKPHOS 109* 07/23/2011 0916   AST 31 04/17/2014 1357   AST 24 02/22/2014 0852   AST 31 07/23/2011 0916   ALT 15 04/17/2014 1357   ALT 12 02/22/2014 0852   ALT 22 07/23/2011 0916   BILITOT 0.32 04/17/2014 1357   BILITOT 0.2* 02/22/2014 0852   BILITOT 0.60 07/23/2011 0916       RADIOGRAPHIC STUDIES: Ct Chest W Contrast  03/29/2014   CLINICAL DATA:  80 year old female with recurrent spindle cell sarcomatoid carcinoma in the right lung status post right upper lobe wedge resection in  2013. Prior history of chemotherapy and radiation therapy, currently with undergoing chemotherapy. Shortness of breath on exertion.  EXAM: CT CHEST, ABDOMEN, AND PELVIS WITH CONTRAST  TECHNIQUE: Multidetector CT imaging of the chest, abdomen and pelvis was performed following the standard protocol during bolus administration of intravenous contrast.  CONTRAST:  48mL OMNIPAQUE IOHEXOL 300 MG/ML  SOLN  COMPARISON:  CT of the chest, abdomen and pelvis 01/31/2014.  FINDINGS: CT CHEST FINDINGS  Mediastinum: Heart size is normal. There is no significant pericardial fluid, thickening or pericardial calcification. There is atherosclerosis of the thoracic aorta, the great vessels of the mediastinum and the coronary arteries, including calcified atherosclerotic plaque in the left main, left anterior descending, left circumflex and right coronary arteries. Mildly enlarged right hilar lymph node measuring 1 cm in short axis. No other definite mediastinal or left hilar lymphadenopathy. Patulous esophagus, largely fluid filled. Surgical changes near the gastroesophageal junction.  Lungs/Pleura: Postoperative changes of wedge resection in the apex of the right upper lobe are noted. Extensive peripheral subpleural reticulation in the anterior and lateral aspect of the right lung, likely related to prior radiation therapy. The smaller pleural-based nodule noted on the prior examination currently measures approximately 1.8 x 1.4 cm (image 31 of series 2), slightly increased compared to the prior study. The larger heterogeneously enhancing pleural based mass in the lateral right hemithorax appears slightly larger than the prior examination, currently measuring up to 8.7 x 3.6 cm, and appears to be clearly invading the chest wall (best appreciated on image 33 of series 2), with some outward extension between the sixth and seventh ribs, and destructive changes in the sixth rib where there is a minimally displaced pathologic fracture, as  well as the seventh rib. Extensive fibrotic changes are also noted in the right lower lobe where there is some mild cylindrical and varicose bronchiectasis. Mild diffuse bronchial wall thickening. Mild centrilobular emphysema.  Musculoskeletal: Right-sided chest wall invasion with destructive changes in the right sixth and seventh ribs and pathologic fracture of the right sixth rib, as above. Old postthoracotomy changes in the right ribs redemonstrated.  CT ABDOMEN AND PELVIS FINDINGS  Hepatobiliary: Status post cholecystectomy. Mild intrahepatic biliary ductal dilatation is unchanged compared to the prior examination. Common bile duct is normal in caliber measuring 7 mm in the porta hepatis. This is presumably a "functional" bile duct dilatation in the setting of prior cholecystectomy. Dilated cystic duct remnant is unchanged and chronic. No suspicious cystic or solid hepatic lesions.  Pancreas: Slightly  atrophied, but otherwise unremarkable in appearance.  Spleen: Unremarkable.  Adrenals/Urinary Tract: Sub cm low-attenuation lesions in the kidneys bilaterally are too small to definitively characterize. No other suspicious appearing renal lesions are noted. No hydroureteronephrosis. Urinary bladder is unremarkable in appearance. Bilateral adrenal glands are normal in appearance.  Stomach/Bowel: Postoperative changes in a proximal stomach centered around the gastroesophageal junction again noted. No pathologic dilatation of small bowel or colon.  Vascular/Lymphatic: Extensive atherosclerosis throughout the abdominal and pelvic vasculature, without definite aneurysm or dissection. Probable high-grade stenosis of the proximal left external iliac artery (image 81 of series 2). No lymphadenopathy noted in the abdomen or pelvis.  Reproductive: Status post hysterectomy. Ovaries are not confidently identified may be surgically absent or atrophic.  Other: No significant volume of ascites.  No pneumoperitoneum.   Musculoskeletal: Multiple Schmorl's nodes in the lower lumbar spine incidentally noted. There are no aggressive appearing lytic or blastic lesions noted in the visualized portions of the skeleton.  IMPRESSION: 1. Today's study demonstrates progression of disease in the chest, with enlargement of the previously described pleural-based lesions in the right hemithorax, now with definitive evidence of chest wall invasion, including rib involvement in the sixth and seventh ribs where there is a minimally displaced pathologic fracture of the lateral sixth rib. 2. Additional incidental findings, as above, similar prior examinations.   Electronically Signed   By: Vinnie Langton M.D.   On: 03/29/2014 11:39   Ct Abdomen Pelvis W Contrast  03/29/2014   CLINICAL DATA:  80 year old female with recurrent spindle cell sarcomatoid carcinoma in the right lung status post right upper lobe wedge resection in 2013. Prior history of chemotherapy and radiation therapy, currently with undergoing chemotherapy. Shortness of breath on exertion.  EXAM: CT CHEST, ABDOMEN, AND PELVIS WITH CONTRAST  TECHNIQUE: Multidetector CT imaging of the chest, abdomen and pelvis was performed following the standard protocol during bolus administration of intravenous contrast.  CONTRAST:  54mL OMNIPAQUE IOHEXOL 300 MG/ML  SOLN  COMPARISON:  CT of the chest, abdomen and pelvis 01/31/2014.  FINDINGS: CT CHEST FINDINGS  Mediastinum: Heart size is normal. There is no significant pericardial fluid, thickening or pericardial calcification. There is atherosclerosis of the thoracic aorta, the great vessels of the mediastinum and the coronary arteries, including calcified atherosclerotic plaque in the left main, left anterior descending, left circumflex and right coronary arteries. Mildly enlarged right hilar lymph node measuring 1 cm in short axis. No other definite mediastinal or left hilar lymphadenopathy. Patulous esophagus, largely fluid filled. Surgical  changes near the gastroesophageal junction.  Lungs/Pleura: Postoperative changes of wedge resection in the apex of the right upper lobe are noted. Extensive peripheral subpleural reticulation in the anterior and lateral aspect of the right lung, likely related to prior radiation therapy. The smaller pleural-based nodule noted on the prior examination currently measures approximately 1.8 x 1.4 cm (image 31 of series 2), slightly increased compared to the prior study. The larger heterogeneously enhancing pleural based mass in the lateral right hemithorax appears slightly larger than the prior examination, currently measuring up to 8.7 x 3.6 cm, and appears to be clearly invading the chest wall (best appreciated on image 33 of series 2), with some outward extension between the sixth and seventh ribs, and destructive changes in the sixth rib where there is a minimally displaced pathologic fracture, as well as the seventh rib. Extensive fibrotic changes are also noted in the right lower lobe where there is some mild cylindrical and varicose bronchiectasis. Mild diffuse bronchial wall  thickening. Mild centrilobular emphysema.  Musculoskeletal: Right-sided chest wall invasion with destructive changes in the right sixth and seventh ribs and pathologic fracture of the right sixth rib, as above. Old postthoracotomy changes in the right ribs redemonstrated.  CT ABDOMEN AND PELVIS FINDINGS  Hepatobiliary: Status post cholecystectomy. Mild intrahepatic biliary ductal dilatation is unchanged compared to the prior examination. Common bile duct is normal in caliber measuring 7 mm in the porta hepatis. This is presumably a "functional" bile duct dilatation in the setting of prior cholecystectomy. Dilated cystic duct remnant is unchanged and chronic. No suspicious cystic or solid hepatic lesions.  Pancreas: Slightly atrophied, but otherwise unremarkable in appearance.  Spleen: Unremarkable.  Adrenals/Urinary Tract: Sub cm  low-attenuation lesions in the kidneys bilaterally are too small to definitively characterize. No other suspicious appearing renal lesions are noted. No hydroureteronephrosis. Urinary bladder is unremarkable in appearance. Bilateral adrenal glands are normal in appearance.  Stomach/Bowel: Postoperative changes in a proximal stomach centered around the gastroesophageal junction again noted. No pathologic dilatation of small bowel or colon.  Vascular/Lymphatic: Extensive atherosclerosis throughout the abdominal and pelvic vasculature, without definite aneurysm or dissection. Probable high-grade stenosis of the proximal left external iliac artery (image 81 of series 2). No lymphadenopathy noted in the abdomen or pelvis.  Reproductive: Status post hysterectomy. Ovaries are not confidently identified may be surgically absent or atrophic.  Other: No significant volume of ascites.  No pneumoperitoneum.  Musculoskeletal: Multiple Schmorl's nodes in the lower lumbar spine incidentally noted. There are no aggressive appearing lytic or blastic lesions noted in the visualized portions of the skeleton.  IMPRESSION: 1. Today's study demonstrates progression of disease in the chest, with enlargement of the previously described pleural-based lesions in the right hemithorax, now with definitive evidence of chest wall invasion, including rib involvement in the sixth and seventh ribs where there is a minimally displaced pathologic fracture of the lateral sixth rib. 2. Additional incidental findings, as above, similar prior examinations.   Electronically Signed   By: Vinnie Langton M.D.   On: 03/29/2014 11:39    ASSESSMENT AND PLAN: This is a very pleasant 80 years old [place] American female with recurrent spindle cell carcinoma of the right lung consistent with sarcomatoid carcinoma, status post curative radiotherapy completed on 01/10/2013.  She also underwent a course of stereotactic radiotherapy to the pleural/subpleural  area under the care of Dr. Tammi Klippel. Her recent scan showed evidence for disease progression with increased pleural and subpleural metastatic disease in the lateral right hemithorax. She underwent systemic chemotherapy with carboplatin and paclitaxel is status post 3 cycles and tolerating her treatment fairly well with no significant adverse effects except for the peripheral neuropathy and this was discontinued secondary to disease progression. She is currently undergoing second line systemic chemotherapy with single agent gemcitabine status post 3 cycles. She tolerated this treatment well. The patient was discussed with and also seen by Dr. Julien Nordmann. She will continue on Cymbalta 30 mg at bedtime for her peripheral neuropathy symptoms. Her restaging CT scan showed evidence for progression in the chest with enlargement of the previously described pleural-based lesion similar right hemithorax with definitive evidence of chest wall invasion including rib involvement of the sixth and seventh ribs where there is minimally displaced pathologic fracture of the lateral sixth rib. Chemotherapy with gemcitabine was discontinued secondary to disease progression. Her PDL 1 expression was negative. We will plan to treat the patient with Nivolumab 3 mg/kg given every 2 weeks. Patient would like to wait until after Christmas  to start her treatment. First cycle be scheduled for 05/03/2014.   She was advised to call immediately if she has any concerning symptoms in the interval.  The patient voices understanding of current disease status and treatment options and is in agreement with the current care plan.  All questions were answered. The patient knows to call the clinic with any problems, questions or concerns. We can certainly see the patient much sooner if necessary.  Disclaimer: This note was dictated with voice recognition software. Similar sounding words can inadvertently be transcribed and may not be corrected upon  review.     Kaitlyn Adam, PA-C 04/17/2014  ADDENDUM: Hematology/Oncology Attending: I had a face to face encounter with the patient today. I recommended her care plan. This is a very pleasant 80 years old African-American female with metastatic sarcomatoid carcinoma status post systemic chemotherapy with carboplatin and paclitaxel followed by treatment with single agent gemcitabine but unfortunately she continues to have evidence for disease progression. I recommended for the patient treatment with immunotherapy with Nivolumab 3 MG/M2 every 2 weeks. I discussed with the patient adverse effect of this treatment including but not limited to immune mediated skin rash, diarrhea, liver or renal dysfunction as well as endocrine dysfunction. The patient would like to proceed with treatment as planned but she would like to hold the start of this treatment until after Christmas. She is expected to start the first cycle of this treatment on 05/03/2014. She would come back for follow-up visit with the start of cycle #2. She was advised to call immediately if she has any concerning symptoms in the interval.  Disclaimer: This note was dictated with voice recognition software. Similar sounding words can inadvertently be transcribed and may be missed upon review. Eilleen Kempf., MD 04/17/2014

## 2014-04-17 NOTE — Patient Instructions (Signed)
Your test for PDL 1 expression was negative We'll plan to treat you with Nivolumab starting on 05/03/2014 this will be given every 2 weeks

## 2014-05-03 ENCOUNTER — Ambulatory Visit (HOSPITAL_BASED_OUTPATIENT_CLINIC_OR_DEPARTMENT_OTHER): Payer: Medicare Other | Admitting: Physician Assistant

## 2014-05-03 ENCOUNTER — Other Ambulatory Visit (HOSPITAL_BASED_OUTPATIENT_CLINIC_OR_DEPARTMENT_OTHER): Payer: Medicare Other

## 2014-05-03 ENCOUNTER — Telehealth: Payer: Self-pay | Admitting: *Deleted

## 2014-05-03 ENCOUNTER — Ambulatory Visit (HOSPITAL_BASED_OUTPATIENT_CLINIC_OR_DEPARTMENT_OTHER): Payer: Medicare Other

## 2014-05-03 ENCOUNTER — Encounter: Payer: Self-pay | Admitting: Physician Assistant

## 2014-05-03 ENCOUNTER — Telehealth: Payer: Self-pay | Admitting: Physician Assistant

## 2014-05-03 VITALS — BP 136/84 | HR 79 | Temp 97.4°F | Resp 18 | Ht 59.0 in | Wt 75.4 lb

## 2014-05-03 DIAGNOSIS — Z5112 Encounter for antineoplastic immunotherapy: Secondary | ICD-10-CM

## 2014-05-03 DIAGNOSIS — C3411 Malignant neoplasm of upper lobe, right bronchus or lung: Secondary | ICD-10-CM

## 2014-05-03 DIAGNOSIS — C801 Malignant (primary) neoplasm, unspecified: Secondary | ICD-10-CM

## 2014-05-03 DIAGNOSIS — Z79899 Other long term (current) drug therapy: Secondary | ICD-10-CM

## 2014-05-03 DIAGNOSIS — C782 Secondary malignant neoplasm of pleura: Secondary | ICD-10-CM

## 2014-05-03 LAB — CBC WITH DIFFERENTIAL/PLATELET
BASO%: 0.5 % (ref 0.0–2.0)
Basophils Absolute: 0 10*3/uL (ref 0.0–0.1)
EOS%: 1.3 % (ref 0.0–7.0)
Eosinophils Absolute: 0.1 10*3/uL (ref 0.0–0.5)
HEMATOCRIT: 37.8 % (ref 34.8–46.6)
HGB: 12.2 g/dL (ref 11.6–15.9)
LYMPH%: 26.3 % (ref 14.0–49.7)
MCH: 31 pg (ref 25.1–34.0)
MCHC: 32.3 g/dL (ref 31.5–36.0)
MCV: 95.9 fL (ref 79.5–101.0)
MONO#: 1.1 10*3/uL — ABNORMAL HIGH (ref 0.1–0.9)
MONO%: 12.5 % (ref 0.0–14.0)
NEUT#: 5.1 10*3/uL (ref 1.5–6.5)
NEUT%: 59.4 % (ref 38.4–76.8)
PLATELETS: 239 10*3/uL (ref 145–400)
RBC: 3.94 10*6/uL (ref 3.70–5.45)
RDW: 16.6 % — ABNORMAL HIGH (ref 11.2–14.5)
WBC: 8.5 10*3/uL (ref 3.9–10.3)
lymph#: 2.2 10*3/uL (ref 0.9–3.3)

## 2014-05-03 LAB — COMPREHENSIVE METABOLIC PANEL (CC13)
ALT: 11 U/L (ref 0–55)
ANION GAP: 10 meq/L (ref 3–11)
AST: 28 U/L (ref 5–34)
Albumin: 3.2 g/dL — ABNORMAL LOW (ref 3.5–5.0)
Alkaline Phosphatase: 108 U/L (ref 40–150)
BILIRUBIN TOTAL: 0.43 mg/dL (ref 0.20–1.20)
BUN: 11.2 mg/dL (ref 7.0–26.0)
CO2: 28 meq/L (ref 22–29)
CREATININE: 0.8 mg/dL (ref 0.6–1.1)
Calcium: 10.3 mg/dL (ref 8.4–10.4)
Chloride: 104 mEq/L (ref 98–109)
EGFR: 84 mL/min/{1.73_m2} — ABNORMAL LOW (ref >90)
Glucose: 98 mg/dl (ref 70–140)
Potassium: 5 mEq/L (ref 3.5–5.1)
Sodium: 142 mEq/L (ref 136–145)
Total Protein: 7.4 g/dL (ref 6.4–8.3)

## 2014-05-03 LAB — TSH CHCC: TSH: 5.157 m(IU)/L — ABNORMAL HIGH (ref 0.308–3.960)

## 2014-05-03 MED ORDER — SODIUM CHLORIDE 0.9 % IV SOLN
Freq: Once | INTRAVENOUS | Status: AC
  Administered 2014-05-03: 14:00:00 via INTRAVENOUS

## 2014-05-03 MED ORDER — ZOLPIDEM TARTRATE 5 MG PO TABS: 5.0000 mg | ORAL_TABLET | Freq: Every evening | ORAL | Status: DC | PRN

## 2014-05-03 MED ORDER — HYDROCODONE-ACETAMINOPHEN 5-325 MG PO TABS: 1.0000 | ORAL_TABLET | Freq: Four times a day (QID) | ORAL | Status: DC | PRN

## 2014-05-03 MED ORDER — SODIUM CHLORIDE 0.9 % IV SOLN
3.0000 mg/kg | Freq: Once | INTRAVENOUS | Status: AC
  Administered 2014-05-03: 100 mg via INTRAVENOUS
  Filled 2014-05-03: qty 10

## 2014-05-03 NOTE — Progress Notes (Addendum)
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  2) locally advanced adenocarcinoma of the stomach status post resection in July of 2002 followed by concurrent chemoradiation under the care of Dr. Lisbeth Renshaw and Dr. Jamse Arn.  PRIOR THERAPY: 1) status post wedge resection of the right upper lobe under the care of Dr. Servando Snare on 09/16/2011 and the final pathology was consistent with a spindle cell carcinoma. 2) status post curative radiotherapy to the recurrent disease in the right lung under the care of Dr. Tammi Klippel completed on 01/10/2013. 3) status post stereotactic radiotherapy to the metastatic deposits in the lung completed 06/03/2013 under the care of Dr. Tammi Klippel. 4) Systemic chemotherapy with carboplatin for AUC of 5 and paclitaxel 175 mg/M2 every 3 weeks with Neulasta support. First dose 11/30/2013. Status post 3 cycles. Discontinued secondary to disease progression. 5) Systemic chemotherapy with gemcitabine 1000 mg/M2 on days 1 and 8 every 3 weeks. Status post 3 cycles.  CURRENT THERAPY: Immunotherapy with Nivolumab 3 mg/kg given every 2 weeks. First cycle to be given 05/03/2014  INTERVAL HISTORY: [name] 80 y.o. female returns to the clinic today for followup visit. She presents to proceed with her first cycle of immunotherapy with Nivolumab.She reports enjoying her Christmas holiday. She requests a refill for her pain medication as well as her sleeping pill. She voices no specific complaints today except for  persistent peripheral neuropathy from the previous treatment with carboplatin and paclitaxel. She is taking Cymbalts with some improvement. She denied having any significant fever or  chills, no nausea or vomiting. The patient denied having any significant chest pain, shortness breath, cough or hemoptysis. She is still very active.  She has fatigue at times.   MEDICAL HISTORY: Past Medical History  Diagnosis Date  . Dyslipidemia   . Arthritis     Hx of osteoarthritis  . Shortness of breath     WITH EXERTION   . COPD (chronic obstructive pulmonary disease)   . Anemia   . History of chemotherapy     carboplatin/taxol  . Lung mass   . 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 History of radiation therapy 01/13/11-02/20/11    abdomen    ALLERGIES:  is allergic to atarax.  MEDICATIONS:  Current Outpatient Prescriptions  Medication Sig Dispense Refill  . albuterol (PROVENTIL HFA;VENTOLIN HFA) 108 (90 BASE) MCG/ACT inhaler Inhale 3 puffs into the lungs every 4 (four) hours as needed. For shortness of breath     . calcium-vitamin D (OSCAL WITH D) 500-200 MG-UNIT per tablet Take 1 tablet by mouth every other day.     . cholecalciferol (VITAMIN D) 1000 UNITS tablet Take 1,000 Units by mouth daily.    . clobetasol cream (TEMOVATE) 0.05 %     . Cyanocobalamin (B-12) 100 MCG TABS Take 1 tablet by mouth as needed.     . DULoxetine (CYMBALTA) 30 MG capsule Take 1 capsule (30 mg total) by mouth daily. 30 capsule 3  . Ferrous Sulfate (IRON) 325 (65 FE) MG TABS Take 1 tablet by mouth daily.     . fish oil-omega-3 fatty acids 1000 MG capsule Take 1 g by mouth daily.     Marland Kitchen gabapentin (NEURONTIN) 100 MG capsule Take 1 capsule (  100 mg total) by mouth daily. 30 capsule 0  . HYDROcodone-acetaminophen (NORCO) 5-325 MG per tablet Take 1 tablet by mouth every 6 (six) hours as needed for moderate pain. 30 tablet 0  . Multiple Vitamins-Minerals (CENTRUM SILVER PO) Take 1 tablet by mouth daily.     . prochlorperazine (COMPAZINE) 10 MG tablet Take 1 tablet (10 mg total) by mouth every 6 (six) hours as needed for nausea or vomiting. 30 tablet 1  . zolpidem (AMBIEN) 5 MG tablet Take 1 tablet (5 mg total)  by mouth at bedtime as needed for sleep. 30 tablet 0  . fluconazole (DIFLUCAN) 100 MG tablet Take 1 tablet (100 mg total) by mouth daily. (Patient not taking: Reported on 05/03/2014) 10 tablet 0  . Multiple Vitamins-Minerals (ICAPS PO) Take 1 tablet by mouth every other day.      No current facility-administered medications for this visit.   Facility-Administered Medications Ordered in Other Visits  Medication Dose Route Frequency Provider Last Rate Last Dose  . nivolumab (OPDIVO) 100 mg in sodium chloride 0.9 % 100 mL chemo infusion  3 mg/kg (Treatment Plan Actual) Intravenous Once Curt Bears, MD 110 mL/hr at 05/03/14 1439 100 mg at 05/03/14 1439    SURGICAL HISTORY:  Past Surgical History  Procedure Laterality Date  . Esophagogastrectomy, j tube 11/08/2010    . Cholecystectomy    . Abdominal hysterectomy    . Cholecystectomy    . Cataract extraction      RIGHT EYE   . Port-a-cath removal  05/13/2011    Procedure: REMOVAL PORT-A-CATH;  Surgeon: Stark Klein, MD;  Location: [facility];  Service: General;  Laterality: N/A;  Removal port-a-cath.  . Portacath placement    . Eye surgery    . Bronchoscopy  08/28/11    rt upper lung mass bx  . Cardia resected  11/2010  . Wedge resection  09/18/11    right vats Dr. Lanelle Bal    REVIEW OF SYSTEMS:  Constitutional: positive for fatigue Eyes: negative Ears, nose, mouth, throat, and face: negative Respiratory: negative Cardiovascular: negative Gastrointestinal: negative Genitourinary:negative Integument/breast: negative Hematologic/lymphatic: negative Musculoskeletal:negative Neurological: positive for paresthesia Behavioral/Psych: negative Endocrine: negative Allergic/Immunologic: negative   PHYSICAL EXAMINATION: General appearance: alert, cooperative and no distress Head: Normocephalic, without obvious abnormality, atraumatic Neck: no adenopathy, no JVD, supple, symmetrical, trachea midline and thyroid not enlarged, symmetric,  no tenderness/mass/nodules Lymph nodes: Cervical, supraclavicular, and axillary nodes normal. Resp: clear to auscultation bilaterally Back: symmetric, no curvature. ROM normal. No CVA tenderness. Cardio: regular rate and rhythm, S1, S2 normal, no murmur, click, rub or gallop GI: soft, non-tender; bowel sounds normal; no masses,  no organomegaly Extremities: extremities normal, atraumatic, no cyanosis or edema Neurologic: Alert and oriented X 3, normal strength and tone. Normal symmetric reflexes. Normal coordination and gait Mouth: Clear, no evidence of thrush or mucositis  ECOG PERFORMANCE STATUS: 1 - Symptomatic but completely ambulatory  Blood pressure 136/84, pulse 79, temperature 97.4 F (36.3 C), temperature source Oral, resp. rate 18, height 4\' 11"  (1.499 m), weight 75 lb 6.4 oz (34.201 kg), SpO2 93 %.  LABORATORY DATA: Lab Results  Component Value Date   WBC 8.5 05/03/2014   HGB 12.2 05/03/2014   HCT 37.8 05/03/2014   MCV 95.9 05/03/2014   PLT 239 05/03/2014      Chemistry      Component Value Date/Time   NA 142 05/03/2014 1119   NA 141 02/22/2014 0852   NA 143 07/23/2011 0916   K 5.0  05/03/2014 1119   K 4.1 02/22/2014 0852   K 3.8 07/23/2011 0916   CL 106 02/22/2014 0852   CL 104 07/23/2011 0916   CO2 28 05/03/2014 1119   CO2 24 02/22/2014 0852   CO2 23 07/23/2011 0916   BUN 11.2 05/03/2014 1119   BUN 12 02/22/2014 0852   BUN 7 07/23/2011 0916   CREATININE 0.8 05/03/2014 1119   CREATININE 0.89 02/22/2014 0852   CREATININE 0.8 07/23/2011 0916      Component Value Date/Time   CALCIUM 10.3 05/03/2014 1119   CALCIUM 9.3 02/22/2014 0852   CALCIUM 8.9 07/23/2011 0916   ALKPHOS 108 05/03/2014 1119   ALKPHOS 121* 02/22/2014 0852   ALKPHOS 109* 07/23/2011 0916   AST 28 05/03/2014 1119   AST 24 02/22/2014 0852   AST 31 07/23/2011 0916   ALT 11 05/03/2014 1119   ALT 12 02/22/2014 0852   ALT 22 07/23/2011 0916   BILITOT 0.43 05/03/2014 1119   BILITOT 0.2*  02/22/2014 0852   BILITOT 0.60 07/23/2011 0916       RADIOGRAPHIC STUDIES: No results found.  ASSESSMENT AND PLAN: This is a very pleasant 80 years old African American female with recurrent spindle cell carcinoma of the right lung consistent with sarcomatoid carcinoma, status post curative radiotherapy completed on 01/10/2013.  She also underwent a course of stereotactic radiotherapy to the pleural/subpleural area under the care of Dr. Tammi Klippel. Her recent scan showed evidence for disease progression with increased pleural and subpleural metastatic disease in the lateral right hemithorax. She underwent systemic chemotherapy with carboplatin and paclitaxel is status post 3 cycles and tolerating her treatment fairly well with no significant adverse effects except for the peripheral neuropathy and this was discontinued secondary to disease progression. She underwent second line systemic chemotherapy with single agent gemcitabine status post 3 cycles. She tolerated this treatment well. The patient was discussed with and also seen by Dr. Julien Nordmann. She will continue on Cymbalta 30 mg at bedtime for her peripheral neuropathy symptoms. Her restaging CT scan showed evidence for progression in the chest with enlargement of the previously described pleural-based lesion similar right hemithorax with definitive evidence of chest wall invasion including rib involvement of the sixth and seventh ribs where there is minimally displaced pathologic fracture of the lateral sixth rib. Chemotherapy with gemcitabine was discontinued secondary to disease progression. Her PDL 1 expression was negative. We will plan to treat the patient with Nivolumab 3 mg/kg given every 2 weeks. She will proceed with her first cycle today as scheduled. She'll follow-up in 2 weeks for symptom management visit prior to cycle #2. She is given refill prescription for her Norco 5/325 mg tablets as well as for Ambien 5 mg tablets each prescription was  for a total of 30 tablets with no refill.  She was advised to call immediately if she has any concerning symptoms in the interval.  The patient voices understanding of current disease status and treatment options and is in agreement with the current care plan.  All questions were answered. The patient knows to call the clinic with any problems, questions or concerns. We can certainly see the patient much sooner if necessary.  Disclaimer: This note was dictated with voice recognition software. Similar sounding words can inadvertently be transcribed and may not be corrected upon review.     Carlton Adam, PA-C 05/03/2014  ADDENDUM: Hematology/Oncology Attending: I had a face to face encounter with the patient. I recommended her care plan. This is a  very pleasant 80 years old white female with sarcomatoid carcinoma of the lung status post several chemotherapy regimens and unfortunately has evidence for disease progression. She is here today to start the first cycle of immunotherapy with Nivolumab. Her PDL 1 expression was negative. The patient is feeling fine except for the pain on the right side of the chest. I recommended for her to proceed with her first cycle of the immunotherapy today as scheduled. She will come back for follow-up visit in 2 weeks for reevaluation and management of any adverse effect of her treatment. She was advised to call immediately if she has any concerning symptoms in the interval.  Disclaimer: This note was dictated with voice recognition software. Similar sounding words can inadvertently be transcribed and may be missed upon review. Eilleen Kempf., MD 05/06/2014

## 2014-05-03 NOTE — Telephone Encounter (Signed)
Pt confirmed labs/ov per 12/30 POF, gave pt AVS.... KJ, sent msg to add chemo

## 2014-05-03 NOTE — Telephone Encounter (Signed)
Per staff message and POF I have scheduled appts. Advised scheduler of appts. JMW  

## 2014-05-04 NOTE — Patient Instructions (Signed)
Continue labs and chemotherapy as scheduled Follow-up in 2 weeks prior to next scheduled cycle of immunotherapy

## 2014-05-17 ENCOUNTER — Ambulatory Visit (HOSPITAL_BASED_OUTPATIENT_CLINIC_OR_DEPARTMENT_OTHER): Payer: Medicare Other

## 2014-05-17 ENCOUNTER — Other Ambulatory Visit (HOSPITAL_BASED_OUTPATIENT_CLINIC_OR_DEPARTMENT_OTHER): Payer: Medicare Other

## 2014-05-17 ENCOUNTER — Encounter: Payer: Self-pay | Admitting: Internal Medicine

## 2014-05-17 ENCOUNTER — Other Ambulatory Visit: Payer: Self-pay | Admitting: Physician Assistant

## 2014-05-17 ENCOUNTER — Telehealth: Payer: Self-pay | Admitting: Internal Medicine

## 2014-05-17 ENCOUNTER — Ambulatory Visit (HOSPITAL_BASED_OUTPATIENT_CLINIC_OR_DEPARTMENT_OTHER): Payer: Medicare Other | Admitting: Internal Medicine

## 2014-05-17 VITALS — BP 142/79 | HR 71 | Temp 97.5°F | Resp 17 | Ht 59.0 in | Wt 80.3 lb

## 2014-05-17 DIAGNOSIS — C7951 Secondary malignant neoplasm of bone: Secondary | ICD-10-CM

## 2014-05-17 DIAGNOSIS — C3411 Malignant neoplasm of upper lobe, right bronchus or lung: Secondary | ICD-10-CM

## 2014-05-17 DIAGNOSIS — C782 Secondary malignant neoplasm of pleura: Secondary | ICD-10-CM

## 2014-05-17 DIAGNOSIS — B029 Zoster without complications: Secondary | ICD-10-CM

## 2014-05-17 DIAGNOSIS — T451X5A Adverse effect of antineoplastic and immunosuppressive drugs, initial encounter: Secondary | ICD-10-CM

## 2014-05-17 DIAGNOSIS — C801 Malignant (primary) neoplasm, unspecified: Secondary | ICD-10-CM

## 2014-05-17 DIAGNOSIS — Z5112 Encounter for antineoplastic immunotherapy: Secondary | ICD-10-CM

## 2014-05-17 DIAGNOSIS — G620 Drug-induced polyneuropathy: Secondary | ICD-10-CM

## 2014-05-17 DIAGNOSIS — R21 Rash and other nonspecific skin eruption: Secondary | ICD-10-CM

## 2014-05-17 LAB — COMPREHENSIVE METABOLIC PANEL (CC13)
ALT: 9 U/L (ref 0–55)
AST: 22 U/L (ref 5–34)
Albumin: 2.9 g/dL — ABNORMAL LOW (ref 3.5–5.0)
Alkaline Phosphatase: 103 U/L (ref 40–150)
Anion Gap: 8 mEq/L (ref 3–11)
BUN: 15.3 mg/dL (ref 7.0–26.0)
CO2: 28 mEq/L (ref 22–29)
Calcium: 8.8 mg/dL (ref 8.4–10.4)
Chloride: 108 mEq/L (ref 98–109)
Creatinine: 0.9 mg/dL (ref 0.6–1.1)
EGFR: 74 mL/min/{1.73_m2} — ABNORMAL LOW (ref >90)
GLUCOSE: 88 mg/dL (ref 70–140)
POTASSIUM: 3.3 meq/L — AB (ref 3.5–5.1)
Sodium: 143 mEq/L (ref 136–145)
Total Bilirubin: 0.29 mg/dL (ref 0.20–1.20)
Total Protein: 6.9 g/dL (ref 6.4–8.3)

## 2014-05-17 LAB — CBC WITH DIFFERENTIAL/PLATELET
BASO%: 0.5 % (ref 0.0–2.0)
BASOS ABS: 0.1 10*3/uL (ref 0.0–0.1)
EOS ABS: 0.1 10*3/uL (ref 0.0–0.5)
EOS%: 1.2 % (ref 0.0–7.0)
HEMATOCRIT: 33 % — AB (ref 34.8–46.6)
HGB: 10.9 g/dL — ABNORMAL LOW (ref 11.6–15.9)
LYMPH#: 1.5 10*3/uL (ref 0.9–3.3)
LYMPH%: 16.4 % (ref 14.0–49.7)
MCH: 30.4 pg (ref 25.1–34.0)
MCHC: 33 g/dL (ref 31.5–36.0)
MCV: 92.2 fL (ref 79.5–101.0)
MONO#: 1.1 10*3/uL — ABNORMAL HIGH (ref 0.1–0.9)
MONO%: 11.9 % (ref 0.0–14.0)
NEUT#: 6.5 10*3/uL (ref 1.5–6.5)
NEUT%: 70 % (ref 38.4–76.8)
Platelets: 210 10*3/uL (ref 145–400)
RBC: 3.58 10*6/uL — ABNORMAL LOW (ref 3.70–5.45)
RDW: 16.4 % — ABNORMAL HIGH (ref 11.2–14.5)
WBC: 9.3 10*3/uL (ref 3.9–10.3)
nRBC: 1 % — ABNORMAL HIGH (ref 0–0)

## 2014-05-17 LAB — TECHNOLOGIST REVIEW

## 2014-05-17 MED ORDER — VALACYCLOVIR HCL 500 MG PO TABS: 500.0000 mg | ORAL_TABLET | Freq: Two times a day (BID) | ORAL | Status: AC

## 2014-05-17 MED ORDER — SODIUM CHLORIDE 0.9 % IV SOLN
Freq: Once | INTRAVENOUS | Status: AC
  Administered 2014-05-17: 11:00:00 via INTRAVENOUS

## 2014-05-17 MED ORDER — SODIUM CHLORIDE 0.9 % IV SOLN
3.0000 mg/kg | Freq: Once | INTRAVENOUS | Status: AC
  Administered 2014-05-17: 100 mg via INTRAVENOUS
  Filled 2014-05-17: qty 10

## 2014-05-17 NOTE — Telephone Encounter (Signed)
Pt confirmed labs/ov per 01/13 POF, gave pt AVS.... KJ, sent msg to add chemo

## 2014-05-17 NOTE — Patient Instructions (Addendum)
[facility] Discharge Instructions for Patients Receiving Chemotherapy  Today you received the following chemotherapy agents: Nivolumab  To help prevent nausea and vomiting after your treatment, we encourage you to take your nausea medication as directed   If you develop nausea and vomiting that is not controlled by your nausea medication, call the clinic.   BELOW ARE SYMPTOMS THAT SHOULD BE REPORTED IMMEDIATELY:  *FEVER GREATER THAN 100.5 F  *CHILLS WITH OR WITHOUT FEVER  NAUSEA AND VOMITING THAT IS NOT CONTROLLED WITH YOUR NAUSEA MEDICATION  *UNUSUAL SHORTNESS OF BREATH  *UNUSUAL BRUISING OR BLEEDING  TENDERNESS IN MOUTH AND THROAT WITH OR WITHOUT PRESENCE OF ULCERS  *URINARY PROBLEMS  *BOWEL PROBLEMS  UNUSUAL RASH Items with * indicate a potential emergency and should be followed up as soon as possible.  Feel free to call the clinic should you have any questions or concerns. The clinic phone number is [phone].      Nivolumab injection What is this medicine? NIVOLUMAB (nye VOL ue mab) is used to treat certain types of melanoma and lung cancer. This medicine may be used for other purposes; ask your health care provider or pharmacist if you have questions. COMMON BRAND NAME(S): Opdivo What should I tell my health care provider before I take this medicine? They need to know if you have any of these conditions: -eye disease, vision problems -history of pancreatitis -immune system problems -inflammatory bowel disease -kidney disease -liver disease -lung disease -lupus -myasthenia gravis -multiple sclerosis -organ transplant -stomach or intestine problems -thyroid disease -tingling of the fingers or toes, or other nerve disorder -an unusual or allergic reaction to nivolumab, other medicines, foods, dyes, or preservatives -pregnant or trying to get pregnant -breast-feeding How should I use this medicine? This medicine is for infusion into a  vein. It is given by a health care professional in a hospital or clinic setting. A special MedGuide will be given to you before each treatment. Be sure to read this information carefully each time. Talk to your pediatrician regarding the use of this medicine in children. Special care may be needed. Overdosage: If you think you've taken too much of this medicine contact a poison control center or emergency room at once. Overdosage: If you think you have taken too much of this medicine contact a poison control center or emergency room at once. NOTE: This medicine is only for you. Do not share this medicine with others. What if I miss a dose? It is important not to miss your dose. Call your doctor or health care professional if you are unable to keep an appointment. What may interact with this medicine? Interactions have not been studied. This list may not describe all possible interactions. Give your health care provider a list of all the medicines, herbs, non-prescription drugs, or dietary supplements you use. Also tell them if you smoke, drink alcohol, or use illegal drugs. Some items may interact with your medicine. What should I watch for while using this medicine? Tell your doctor or healthcare professional if your symptoms do not start to get better or if they get worse. Your condition will be monitored carefully while you are receiving this medicine. You may need blood work done while you are taking this medicine. What side effects may I notice from receiving this medicine? Side effects that you should report to your doctor or health care professional as soon as possible: -allergic reactions like skin rash, itching or hives, swelling of the face, lips, or tongue -  black, tarry stools -bloody or watery diarrhea -changes in vision -chills -cough -depressed mood -eye pain -feeling anxious -fever -general ill feeling or flu-like symptoms -hair loss -loss of appetite -low blood counts -  this medicine may decrease the number of white blood cells, red blood cells and platelets. You may be at increased risk for infections and bleeding -pain, tingling, numbness in the hands or feet -redness, blistering, peeling or loosening of the skin, including inside the mouth -red pinpoint spots on skin -signs of decreased platelets or bleeding - bruising, pinpoint red spots on the skin, black, tarry stools, blood in the urine -signs of decreased red blood cells - unusually weak or tired, feeling faint or lightheaded, falls -signs of infection - fever or chills, cough, sore throat, pain or trouble passing urine -signs and symptoms of a dangerous change in heartbeat or heart rhythm like chest pain; dizziness; fast or irregular heartbeat; palpitations; feeling faint or lightheaded, falls; breathing problems -signs and symptoms of high blood sugar such as dizziness; dry mouth; dry skin; fruity breath; nausea; stomach pain; increased hunger or thirst; increased urination -signs and symptoms of kidney injury like trouble passing urine or change in the amount of urine -signs and symptoms of liver injury like dark yellow or brown urine; general ill feeling or flu-like symptoms; light-colored stools; loss of appetite; nausea; right upper belly pain; unusually weak or tired; yellowing of the eyes or skin -signs and symptoms of increased potassium like muscle weakness; chest pain; or fast, irregular heartbeat -signs and symptoms of low potassium like muscle cramps or muscle pain; chest pain; dizziness; feeling faint or lightheaded, falls; palpitations; breathing problems; or fast, irregular heartbeat -swelling of the ankles, feet, hands -weight gainSide effects that usually do not require medical attention (report to your doctor or health care professional if they continue or are bothersome): -constipation -general ill feeling or flu-like symptoms -hair loss -loss of appetite -nausea, vomiting This list  may not describe all possible side effects. Call your doctor for medical advice about side effects. You may report side effects to FDA at 1-800-FDA-1088. Where should I keep my medicine? This drug is given in a hospital or clinic and will not be stored at home. NOTE: This sheet is a summary. It may not cover all possible information. If you have questions about this medicine, talk to your doctor, pharmacist, or health care provider.  2015, [employer]. (2013-07-11 13:18:19)

## 2014-05-17 NOTE — Progress Notes (Signed)
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  2) locally advanced adenocarcinoma of the stomach status post resection in July of 2002 followed by concurrent chemoradiation under the care of Dr. Lisbeth Renshaw and Dr. Jamse Arn.  PRIOR THERAPY: 1) status post wedge resection of the right upper lobe under the care of Dr. Servando Snare on 09/16/2011 and the final pathology was consistent with a spindle cell carcinoma. 2) status post curative radiotherapy to the recurrent disease in the right lung under the care of Dr. Tammi Klippel completed on 01/10/2013. 3) status post stereotactic radiotherapy to the metastatic deposits in the lung completed 06/03/2013 under the care of Dr. Tammi Klippel. 4) Systemic chemotherapy with carboplatin for AUC of 5 and paclitaxel 175 mg/M2 every 3 weeks with Neulasta support. First dose 11/30/2013. Status post 3 cycles. Discontinued secondary to disease progression. 5) Systemic chemotherapy with gemcitabine 1000 mg/M2 on days 1 and 8 every 3 weeks. Status post 3 cycles discontinued secondary to disease progression.  CURRENT THERAPY: Immunotherapy with Nivolumab 3 MG/M2 every 2 weeks status post 1 cycle.   INTERVAL HISTORY: [name] 81 y.o. female returns to the clinic today for followup visit. She is currently on immunotherapy with Nivolumab status post 1 cycle and tolerated the first cycle of her treatment fairly well. She complains of skin rash on the right side started 2 weeks ago. It is itchy but no pain. She denied having any significant fever or chills, no nausea or vomiting. The patient denied having any significant chest pain, shortness of breath, cough or hemoptysis. She gained 2 pounds  recently. She is here today to start cycle #2 of her immunotherapy.  MEDICAL HISTORY: Past Medical History  Diagnosis Date  . Dyslipidemia   . Arthritis     Hx of osteoarthritis  . Shortness of breath     WITH EXERTION   . COPD (chronic obstructive pulmonary disease)   . Anemia   . History of chemotherapy     carboplatin/taxol  . Lung mass   . 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 History of radiation therapy 01/13/11-02/20/11    abdomen    ALLERGIES:  is allergic to atarax.  MEDICATIONS:  Current Outpatient Prescriptions  Medication Sig Dispense Refill  . calcium-vitamin D (OSCAL WITH D) 500-200 MG-UNIT per tablet Take 1 tablet by mouth every other day.     . cholecalciferol (VITAMIN D) 1000 UNITS tablet Take 1,000 Units by mouth daily.    . clobetasol cream (TEMOVATE) 0.05 %     . Cyanocobalamin (B-12) 100 MCG TABS Take 1 tablet by mouth as needed.     . DULoxetine (CYMBALTA) 30 MG capsule Take 1 capsule (30 mg total) by mouth daily. 30 capsule 3  . Ferrous Sulfate (IRON) 325 (65 FE) MG TABS Take 1 tablet by mouth daily.     . fish oil-omega-3 fatty acids 1000 MG capsule Take 1 g by mouth daily.     Marland Kitchen HYDROcodone-acetaminophen (NORCO) 5-325 MG per tablet Take 1 tablet by mouth every 6 (six) hours as needed for moderate pain. 30 tablet 0  . Multiple Vitamins-Minerals (CENTRUM SILVER PO) Take 1 tablet by mouth daily.     Marland Kitchen zolpidem (AMBIEN) 5 MG tablet Take 1  tablet (5 mg total) by mouth at bedtime as needed for sleep. 30 tablet 0  . albuterol (PROVENTIL HFA;VENTOLIN HFA) 108 (90 BASE) MCG/ACT inhaler Inhale 3 puffs into the lungs every 4 (four) hours as needed. For shortness of breath     . prochlorperazine (COMPAZINE) 10 MG tablet Take 1 tablet (10 mg total) by mouth every 6 (six) hours as needed for nausea or vomiting. (Patient not taking: Reported on 05/17/2014) 30 tablet 1   No current facility-administered medications for this visit.    SURGICAL HISTORY:  Past Surgical History    Procedure Laterality Date  . Esophagogastrectomy, j tube 11/08/2010    . Cholecystectomy    . Abdominal hysterectomy    . Cholecystectomy    . Cataract extraction      RIGHT EYE   . Port-a-cath removal  05/13/2011    Procedure: REMOVAL PORT-A-CATH;  Surgeon: Stark Klein, MD;  Location: [facility];  Service: General;  Laterality: N/A;  Removal port-a-cath.  . Portacath placement    . Eye surgery    . Bronchoscopy  08/28/11    rt upper lung mass bx  . Cardia resected  11/2010  . Wedge resection  09/18/11    right vats Dr. Lanelle Bal    REVIEW OF SYSTEMS:  Constitutional: negative Eyes: negative Ears, nose, mouth, throat, and face: negative Respiratory: negative Cardiovascular: negative Gastrointestinal: negative Genitourinary:negative Integument/breast: negative Hematologic/lymphatic: negative Musculoskeletal:negative Neurological: negative Behavioral/Psych: negative Endocrine: negative Allergic/Immunologic: negative   PHYSICAL EXAMINATION: General appearance: alert, cooperative and no distress Head: Normocephalic, without obvious abnormality, atraumatic Neck: no adenopathy, no JVD, supple, symmetrical, trachea midline and thyroid not enlarged, symmetric, no tenderness/mass/nodules Lymph nodes: Cervical, supraclavicular, and axillary nodes normal. Resp: clear to auscultation bilaterally Back: symmetric, no curvature. ROM normal. No CVA tenderness. Cardio: regular rate and rhythm, S1, S2 normal, no murmur, click, rub or gallop GI: soft, non-tender; bowel sounds normal; no masses,  no organomegaly Extremities: extremities normal, atraumatic, no cyanosis or edema Neurologic: Alert and oriented X 3, normal strength and tone. Normal symmetric reflexes. Normal coordination and gait  ECOG PERFORMANCE STATUS: 1 - Symptomatic but completely ambulatory  Blood pressure 142/79, pulse 71, temperature 97.5 F (36.4 C), temperature source Oral, resp. rate 17, height 4\' 11"  (1.499 m), weight  80 lb 4.8 oz (36.424 kg), SpO2 88 %.  LABORATORY DATA: Lab Results  Component Value Date   WBC 9.3 05/17/2014   HGB 10.9* 05/17/2014   HCT 33.0* 05/17/2014   MCV 92.2 05/17/2014   PLT 210 05/17/2014      Chemistry      Component Value Date/Time   NA 143 05/17/2014 0853   NA 141 02/22/2014 0852   NA 143 07/23/2011 0916   K 3.3* 05/17/2014 0853   K 4.1 02/22/2014 0852   K 3.8 07/23/2011 0916   CL 106 02/22/2014 0852   CL 104 07/23/2011 0916   CO2 28 05/17/2014 0853   CO2 24 02/22/2014 0852   CO2 23 07/23/2011 0916   BUN 15.3 05/17/2014 0853   BUN 12 02/22/2014 0852   BUN 7 07/23/2011 0916   CREATININE 0.9 05/17/2014 0853   CREATININE 0.89 02/22/2014 0852   CREATININE 0.8 07/23/2011 0916      Component Value Date/Time   CALCIUM 8.8 05/17/2014 0853   CALCIUM 9.3 02/22/2014 0852   CALCIUM 8.9 07/23/2011 0916   ALKPHOS 103 05/17/2014 0853   ALKPHOS 121* 02/22/2014 0852   ALKPHOS 109* 07/23/2011 0916   AST 22 05/17/2014 0853  AST 24 02/22/2014 0852   AST 31 07/23/2011 0916   ALT 9 05/17/2014 0853   ALT 12 02/22/2014 0852   ALT 22 07/23/2011 0916   BILITOT 0.29 05/17/2014 0853   BILITOT 0.2* 02/22/2014 0852   BILITOT 0.60 07/23/2011 0916       RADIOGRAPHIC STUDIES:  ASSESSMENT AND PLAN: This is a very pleasant 81 years old African American female with recurrent spindle cell carcinoma of the right lung consistent with sarcomatoid carcinoma, status post curative radiotherapy completed on 01/10/2013.  She also underwent a course of stereotactic radiotherapy to the pleural/subpleural area under the care of Dr. Tammi Klippel. Her recent scan showed evidence for disease progression with increased pleural and subpleural metastatic disease in the lateral right hemithorax. She underwent systemic chemotherapy with carboplatin and paclitaxel is status post 3 cycles and tolerating her treatment fairly well with no significant adverse effects except for the peripheral neuropathy and  this was discontinued secondary to disease progression. The patient also received 3 cycles of systemic chemotherapy with single agent gemcitabine but discontinued secondary to disease progression. She is currently undergoing immunotherapy with Nivolumab status post 1 cycle and tolerating it well. She will proceed with cycle #2 today as a scheduled. Her skin rash is suspicious for herpes zoster. I will start the patient on Valtrex 500 mg by mouth twice a day for 7 days. She will come back for follow-up visit in 2 weeks for reevaluation before starting cycle #3.  She was advised to call immediately if she has any concerning symptoms in the interval.  The patient voices understanding of current disease status and treatment options and is in agreement with the current care plan.  All questions were answered. The patient knows to call the clinic with any problems, questions or concerns. We can certainly see the patient much sooner if necessary.  Disclaimer: This note was dictated with voice recognition software. Similar sounding words can inadvertently be transcribed and may not be corrected upon review.

## 2014-05-18 ENCOUNTER — Encounter (HOSPITAL_COMMUNITY): Payer: Self-pay | Admitting: Cardiothoracic Surgery

## 2014-05-30 ENCOUNTER — Telehealth: Payer: Self-pay | Admitting: Medical Oncology

## 2014-05-30 NOTE — Telephone Encounter (Signed)
Called and cancelled appts for tomorrow . Also reports bilateral swelling in toes and ankles. Denies pain in lower legs. I instructed pt to elevate her legs and to go to ED if swelling increases or she starts having pain in lower legs.. I  also asked her to call me back tomorrow so we can get her R/S this week

## 2014-05-31 ENCOUNTER — Ambulatory Visit: Payer: Medicare Other

## 2014-05-31 ENCOUNTER — Telehealth: Payer: Self-pay | Admitting: Internal Medicine

## 2014-05-31 ENCOUNTER — Other Ambulatory Visit: Payer: Medicare Other

## 2014-05-31 ENCOUNTER — Telehealth: Payer: Self-pay | Admitting: Medical Oncology

## 2014-05-31 ENCOUNTER — Ambulatory Visit: Payer: Medicare Other | Admitting: Internal Medicine

## 2014-05-31 NOTE — Telephone Encounter (Signed)
pt called to r/s 1/27 appt. per nurse's note from 1/27 pt was to call back to let us know if she can come in tomorrow. per pt she can come tomorrow. pt was given appt for 1/28 lb/AJ/tx @ 11:15am. ok to add tx per inf mgr.

## 2014-05-31 NOTE — Telephone Encounter (Signed)
Pt cannot make chemo today . She will call us tomorrow to see if she can get here.

## 2014-06-01 ENCOUNTER — Ambulatory Visit (HOSPITAL_BASED_OUTPATIENT_CLINIC_OR_DEPARTMENT_OTHER): Payer: Medicare Other

## 2014-06-01 ENCOUNTER — Encounter: Payer: Self-pay | Admitting: Physician Assistant

## 2014-06-01 ENCOUNTER — Ambulatory Visit (HOSPITAL_BASED_OUTPATIENT_CLINIC_OR_DEPARTMENT_OTHER): Payer: Medicare Other | Admitting: Physician Assistant

## 2014-06-01 ENCOUNTER — Other Ambulatory Visit (HOSPITAL_BASED_OUTPATIENT_CLINIC_OR_DEPARTMENT_OTHER): Payer: Medicare Other

## 2014-06-01 VITALS — BP 130/72 | HR 108 | Temp 97.4°F | Resp 18 | Ht 59.0 in | Wt 91.6 lb

## 2014-06-01 DIAGNOSIS — C801 Malignant (primary) neoplasm, unspecified: Secondary | ICD-10-CM

## 2014-06-01 DIAGNOSIS — C782 Secondary malignant neoplasm of pleura: Secondary | ICD-10-CM

## 2014-06-01 DIAGNOSIS — Z79899 Other long term (current) drug therapy: Secondary | ICD-10-CM

## 2014-06-01 DIAGNOSIS — C7951 Secondary malignant neoplasm of bone: Secondary | ICD-10-CM

## 2014-06-01 DIAGNOSIS — C3411 Malignant neoplasm of upper lobe, right bronchus or lung: Secondary | ICD-10-CM

## 2014-06-01 DIAGNOSIS — Z5112 Encounter for antineoplastic immunotherapy: Secondary | ICD-10-CM

## 2014-06-01 LAB — CBC WITH DIFFERENTIAL/PLATELET
BASO%: 0.5 % (ref 0.0–2.0)
Basophils Absolute: 0.1 10*3/uL (ref 0.0–0.1)
EOS ABS: 0.1 10*3/uL (ref 0.0–0.5)
EOS%: 0.8 % (ref 0.0–7.0)
HCT: 34.2 % — ABNORMAL LOW (ref 34.8–46.6)
HGB: 10.5 g/dL — ABNORMAL LOW (ref 11.6–15.9)
LYMPH#: 1.4 10*3/uL (ref 0.9–3.3)
LYMPH%: 12.1 % — AB (ref 14.0–49.7)
MCH: 29.8 pg (ref 25.1–34.0)
MCHC: 30.8 g/dL — ABNORMAL LOW (ref 31.5–36.0)
MCV: 97 fL (ref 79.5–101.0)
MONO#: 0.9 10*3/uL (ref 0.1–0.9)
MONO%: 8.2 % (ref 0.0–14.0)
NEUT%: 78.4 % — AB (ref 38.4–76.8)
NEUTROS ABS: 8.8 10*3/uL — AB (ref 1.5–6.5)
Platelets: 317 10*3/uL (ref 145–400)
RBC: 3.52 10*6/uL — ABNORMAL LOW (ref 3.70–5.45)
RDW: 18.8 % — ABNORMAL HIGH (ref 11.2–14.5)
WBC: 11.2 10*3/uL — ABNORMAL HIGH (ref 3.9–10.3)

## 2014-06-01 LAB — COMPREHENSIVE METABOLIC PANEL (CC13)
ALT: 11 U/L (ref 0–55)
AST: 22 U/L (ref 5–34)
Albumin: 2.4 g/dL — ABNORMAL LOW (ref 3.5–5.0)
Alkaline Phosphatase: 153 U/L — ABNORMAL HIGH (ref 40–150)
Anion Gap: 12 mEq/L — ABNORMAL HIGH (ref 3–11)
BILIRUBIN TOTAL: 0.29 mg/dL (ref 0.20–1.20)
BUN: 12.7 mg/dL (ref 7.0–26.0)
CO2: 20 meq/L — AB (ref 22–29)
Calcium: 8.8 mg/dL (ref 8.4–10.4)
Chloride: 113 mEq/L — ABNORMAL HIGH (ref 98–109)
Creatinine: 0.8 mg/dL (ref 0.6–1.1)
EGFR: 87 mL/min/{1.73_m2} — ABNORMAL LOW (ref >90)
Glucose: 78 mg/dl (ref 70–140)
Potassium: 3.3 mEq/L — ABNORMAL LOW (ref 3.5–5.1)
Sodium: 145 mEq/L (ref 136–145)
TOTAL PROTEIN: 6.2 g/dL — AB (ref 6.4–8.3)

## 2014-06-01 LAB — TSH CHCC: TSH: 12.06 m[IU]/L — AB (ref 0.308–3.960)

## 2014-06-01 MED ORDER — SODIUM CHLORIDE 0.9 % IV SOLN
Freq: Once | INTRAVENOUS | Status: AC
  Administered 2014-06-01: 14:00:00 via INTRAVENOUS

## 2014-06-01 MED ORDER — HYDROCODONE-ACETAMINOPHEN 5-325 MG PO TABS: 1.0000 | ORAL_TABLET | Freq: Four times a day (QID) | ORAL | Status: DC | PRN

## 2014-06-01 MED ORDER — SODIUM CHLORIDE 0.9 % IV SOLN
3.0000 mg/kg | Freq: Once | INTRAVENOUS | Status: AC
  Administered 2014-06-01: 100 mg via INTRAVENOUS
  Filled 2014-06-01: qty 10

## 2014-06-01 NOTE — Progress Notes (Addendum)
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  2) locally advanced adenocarcinoma of the stomach status post resection in July of 2002 followed by concurrent chemoradiation under the care of Dr. Lisbeth Renshaw and Dr. Jamse Arn.  PRIOR THERAPY: 1) status post wedge resection of the right upper lobe under the care of Dr. Servando Snare on 09/16/2011 and the final pathology was consistent with a spindle cell carcinoma. 2) status post curative radiotherapy to the recurrent disease in the right lung under the care of Dr. Tammi Klippel completed on 01/10/2013. 3) status post stereotactic radiotherapy to the metastatic deposits in the lung completed 06/03/2013 under the care of Dr. Tammi Klippel. 4) Systemic chemotherapy with carboplatin for AUC of 5 and paclitaxel 175 mg/M2 every 3 weeks with Neulasta support. First dose 11/30/2013. Status post 3 cycles. Discontinued secondary to disease progression. 5) Systemic chemotherapy with gemcitabine 1000 mg/M2 on days 1 and 8 every 3 weeks. Status post 3 cycles discontinued secondary to disease progression.  CURRENT THERAPY: Immunotherapy with Nivolumab 3 MG/M2 every 2 weeks status post 2 cycles.   INTERVAL HISTORY: [name] 81 y.o. female returns to the clinic today for followup visit. She is currently on immunotherapy with Nivolumab status post 2 cycles and tolerated  her treatment fairly well. She reports some swelling in her feet. She requests a refill for her Norco, requesting easy open caps due to her arthritis. She denied having any significant fever or chills, no nausea or vomiting. The patient denied having any significant chest pain, shortness of breath, cough or hemoptysis. She  denied diarrhea.  She is here today to start cycle #3 of her immunotherapy.  MEDICAL HISTORY: Past Medical History  Diagnosis Date  . Dyslipidemia   . Arthritis     Hx of osteoarthritis  . Shortness of breath     WITH EXERTION   . COPD (chronic obstructive pulmonary disease)   . Anemia   . History of chemotherapy     carboplatin/taxol  . Lung mass   . 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 History of radiation therapy 01/13/11-02/20/11    abdomen    ALLERGIES:  is allergic to atarax.  MEDICATIONS:  Current Outpatient Prescriptions  Medication Sig Dispense Refill  . albuterol (PROVENTIL HFA;VENTOLIN HFA) 108 (90 BASE) MCG/ACT inhaler Inhale 3 puffs into the lungs every 4 (four) hours as needed. For shortness of breath     . calcium-vitamin D (OSCAL WITH D) 500-200 MG-UNIT per tablet Take 1 tablet by mouth every other day.     . cholecalciferol (VITAMIN D) 1000 UNITS tablet Take 1,000 Units by mouth daily.    . clobetasol cream (TEMOVATE) 0.05 %     . Cyanocobalamin (B-12) 100 MCG TABS Take 1 tablet by mouth as needed.     . DULoxetine (CYMBALTA) 30 MG capsule Take 1 capsule (30 mg total) by mouth daily. 30 capsule 3  . Ferrous Sulfate (IRON) 325 (65 FE) MG TABS Take 1 tablet by mouth daily.     . fish oil-omega-3 fatty acids 1000 MG capsule Take 1 g by mouth daily.     Marland Kitchen HYDROcodone-acetaminophen (NORCO) 5-325 MG per tablet Take 1 tablet by mouth every 6 (six) hours as needed for moderate  pain. 30 tablet 0  . Multiple Vitamins-Minerals (CENTRUM SILVER PO) Take 1 tablet by mouth daily.     . valACYclovir (VALTREX) 500 MG tablet Take 1 tablet (500 mg total) by mouth 2 (two) times daily. 14 tablet 0  . zolpidem (AMBIEN) 5 MG tablet Take 1 tablet (5 mg total) by mouth at bedtime as needed for sleep. 30 tablet 0  . prochlorperazine (COMPAZINE) 10 MG tablet Take 1 tablet (10 mg total) by mouth every 6 (six) hours as needed for nausea or vomiting. (Patient not taking: Reported on 05/17/2014) 30  tablet 1   No current facility-administered medications for this visit.    SURGICAL HISTORY:  Past Surgical History  Procedure Laterality Date  . Esophagogastrectomy, j tube 11/08/2010    . Cholecystectomy    . Abdominal hysterectomy    . Cholecystectomy    . Cataract extraction      RIGHT EYE   . Port-a-cath removal  05/13/2011    Procedure: REMOVAL PORT-A-CATH;  Surgeon: Stark Klein, MD;  Location: [facility];  Service: General;  Laterality: N/A;  Removal port-a-cath.  . Portacath placement    . Eye surgery    . Bronchoscopy  08/28/11    rt upper lung mass bx  . Cardia resected  11/2010  . Wedge resection  09/18/11    right vats Dr. Lanelle Bal    REVIEW OF SYSTEMS:  Constitutional: negative Eyes: negative Ears, nose, mouth, throat, and face: negative Respiratory: negative Cardiovascular: positive for lower extremity edema Gastrointestinal: negative Genitourinary:negative Integument/breast: negative Hematologic/lymphatic: negative Musculoskeletal:negative Neurological: negative Behavioral/Psych: negative Endocrine: negative Allergic/Immunologic: negative   PHYSICAL EXAMINATION: General appearance: alert, cooperative and no distress Head: Normocephalic, without obvious abnormality, atraumatic Neck: no adenopathy, no JVD, supple, symmetrical, trachea midline and thyroid not enlarged, symmetric, no tenderness/mass/nodules Lymph nodes: Cervical, supraclavicular, and axillary nodes normal. Resp: clear to auscultation bilaterally Back: symmetric, no curvature. ROM normal. No CVA tenderness. Cardio: regular rate and rhythm, S1, S2 normal, no murmur, click, rub or gallop GI: soft, non-tender; bowel sounds normal; no masses,  no organomegaly Extremities: extremities normal, atraumatic, no cyanosis or edema Neurologic: Alert and oriented X 3, normal strength and tone. Normal symmetric reflexes. Normal coordination and gait Skin: Right anterior thigh with dry macular lesions in the  distribution of her suspected herpes zoster outbreak. There are no active vesicles.  ECOG PERFORMANCE STATUS: 1 - Symptomatic but completely ambulatory  Blood pressure 130/72, pulse 108, temperature 97.4 F (36.3 C), temperature source Oral, resp. rate 18, height 4\' 11"  (1.499 m), weight 91 lb 9.6 oz (41.549 kg).  LABORATORY DATA: Lab Results  Component Value Date   WBC 11.2* 06/01/2014   HGB 10.5* 06/01/2014   HCT 34.2* 06/01/2014   MCV 97.0 06/01/2014   PLT 317 06/01/2014      Chemistry      Component Value Date/Time   NA 145 06/01/2014 1113   NA 141 02/22/2014 0852   NA 143 07/23/2011 0916   K 3.3* 06/01/2014 1113   K 4.1 02/22/2014 0852   K 3.8 07/23/2011 0916   CL 106 02/22/2014 0852   CL 104 07/23/2011 0916   CO2 20* 06/01/2014 1113   CO2 24 02/22/2014 0852   CO2 23 07/23/2011 0916   BUN 12.7 06/01/2014 1113   BUN 12 02/22/2014 0852   BUN 7 07/23/2011 0916   CREATININE 0.8 06/01/2014 1113   CREATININE 0.89 02/22/2014 0852   CREATININE 0.8 07/23/2011 0916      Component Value  Date/Time   CALCIUM 8.8 06/01/2014 1113   CALCIUM 9.3 02/22/2014 0852   CALCIUM 8.9 07/23/2011 0916   ALKPHOS 153* 06/01/2014 1113   ALKPHOS 121* 02/22/2014 0852   ALKPHOS 109* 07/23/2011 0916   AST 22 06/01/2014 1113   AST 24 02/22/2014 0852   AST 31 07/23/2011 0916   ALT 11 06/01/2014 1113   ALT 12 02/22/2014 0852   ALT 22 07/23/2011 0916   BILITOT 0.29 06/01/2014 1113   BILITOT 0.2* 02/22/2014 0852   BILITOT 0.60 07/23/2011 0916       RADIOGRAPHIC STUDIES:  ASSESSMENT AND PLAN: This is a very pleasant 81 years old African American female with recurrent spindle cell carcinoma of the right lung consistent with sarcomatoid carcinoma, status post curative radiotherapy completed on 01/10/2013.  She also underwent a course of stereotactic radiotherapy to the pleural/subpleural area under the care of Dr. Tammi Klippel. Her recent scan showed evidence for disease progression with increased  pleural and subpleural metastatic disease in the lateral right hemithorax. She underwent systemic chemotherapy with carboplatin and paclitaxel is status post 3 cycles and tolerating her treatment fairly well with no significant adverse effects except for the peripheral neuropathy and this was discontinued secondary to disease progression. The patient also received 3 cycles of systemic chemotherapy with single agent gemcitabine but discontinued secondary to disease progression. She is currently undergoing immunotherapy with Nivolumab status post 2 cycles and tolerating it well. Patient was discussed with and also seen by Dr. Julien Nordmann. She will proceed with cycle #3 today as a scheduled. Her skin rash is suspicious for herpes zoster. She completed her course of Valtrex and the rash is now dry without any active lesions.  She will come back for follow-up visit in 2 weeks for reevaluation before starting cycle #4. She was given a refill prescription for her Norco specifically requesting an easy open Secondary to her arthritis.  She was advised to call immediately if she has any concerning symptoms in the interval.  The patient voices understanding of current disease status and treatment options and is in agreement with the current care plan.  All questions were answered. The patient knows to call the clinic with any problems, questions or concerns. We can certainly see the patient much sooner if necessary.  Carlton Adam, PA-C 06/01/2014  ADDENDUM: Hematology/Oncology Attending: I had a face to face encounter with the patient. I recommended her care plan. This is a very pleasant 81 years old African-American female with metastatic sarcomatoid carcinoma currently undergoing immunotherapy with Nivolumab status post 2 cycles. She is tolerating her treatment well with no significant adverse effects. The patient was recently treated for herpes zoster and completed a course of Valtrex. I recommended for  her to proceed with cycle #3 of her immunotherapy as a scheduled. The patient would come back for follow-up visit in 2 weeks with cycle #4. She was advised to call immediately if she has any concerning symptoms in the interval.  Disclaimer: This note was dictated with voice recognition software. Similar sounding words can inadvertently be transcribed and may not be corrected upon review. Eilleen Kempf., MD 06/05/2014

## 2014-06-01 NOTE — Patient Instructions (Signed)
[facility] Discharge Instructions for Patients Receiving Chemotherapy  Today you received the following chemotherapy agents nivolumab   To help prevent nausea and vomiting after your treatment, we encourage you to take your nausea medication as directed   If you develop nausea and vomiting that is not controlled by your nausea medication, call the clinic.   BELOW ARE SYMPTOMS THAT SHOULD BE REPORTED IMMEDIATELY:  *FEVER GREATER THAN 100.5 F  *CHILLS WITH OR WITHOUT FEVER  NAUSEA AND VOMITING THAT IS NOT CONTROLLED WITH YOUR NAUSEA MEDICATION  *UNUSUAL SHORTNESS OF BREATH  *UNUSUAL BRUISING OR BLEEDING  TENDERNESS IN MOUTH AND THROAT WITH OR WITHOUT PRESENCE OF ULCERS  *URINARY PROBLEMS  *BOWEL PROBLEMS  UNUSUAL RASH Items with * indicate a potential emergency and should be followed up as soon as possible.  Feel free to call the clinic you have any questions or concerns. The clinic phone number is [phone].  

## 2014-06-05 NOTE — Patient Instructions (Signed)
Follow-up in 2 weeks

## 2014-06-14 ENCOUNTER — Encounter: Payer: Self-pay | Admitting: Physician Assistant

## 2014-06-14 ENCOUNTER — Other Ambulatory Visit (HOSPITAL_BASED_OUTPATIENT_CLINIC_OR_DEPARTMENT_OTHER): Payer: Medicare Other

## 2014-06-14 ENCOUNTER — Telehealth: Payer: Self-pay | Admitting: *Deleted

## 2014-06-14 ENCOUNTER — Ambulatory Visit (HOSPITAL_BASED_OUTPATIENT_CLINIC_OR_DEPARTMENT_OTHER): Payer: Medicare Other | Admitting: Physician Assistant

## 2014-06-14 ENCOUNTER — Ambulatory Visit (HOSPITAL_BASED_OUTPATIENT_CLINIC_OR_DEPARTMENT_OTHER): Payer: Medicare Other

## 2014-06-14 ENCOUNTER — Telehealth: Payer: Self-pay | Admitting: Physician Assistant

## 2014-06-14 VITALS — BP 117/69 | Temp 97.9°F | Resp 18 | Ht 59.0 in | Wt 93.3 lb

## 2014-06-14 DIAGNOSIS — G4700 Insomnia, unspecified: Secondary | ICD-10-CM | POA: Diagnosis not present

## 2014-06-14 DIAGNOSIS — C782 Secondary malignant neoplasm of pleura: Secondary | ICD-10-CM

## 2014-06-14 DIAGNOSIS — R918 Other nonspecific abnormal finding of lung field: Secondary | ICD-10-CM

## 2014-06-14 DIAGNOSIS — C3411 Malignant neoplasm of upper lobe, right bronchus or lung: Secondary | ICD-10-CM

## 2014-06-14 DIAGNOSIS — G620 Drug-induced polyneuropathy: Secondary | ICD-10-CM | POA: Diagnosis not present

## 2014-06-14 DIAGNOSIS — R630 Anorexia: Secondary | ICD-10-CM

## 2014-06-14 DIAGNOSIS — C801 Malignant (primary) neoplasm, unspecified: Secondary | ICD-10-CM

## 2014-06-14 DIAGNOSIS — E032 Hypothyroidism due to medicaments and other exogenous substances: Secondary | ICD-10-CM

## 2014-06-14 DIAGNOSIS — Z5112 Encounter for antineoplastic immunotherapy: Secondary | ICD-10-CM

## 2014-06-14 DIAGNOSIS — E039 Hypothyroidism, unspecified: Secondary | ICD-10-CM | POA: Insufficient documentation

## 2014-06-14 DIAGNOSIS — C3491 Malignant neoplasm of unspecified part of right bronchus or lung: Secondary | ICD-10-CM

## 2014-06-14 LAB — COMPREHENSIVE METABOLIC PANEL (CC13)
ALK PHOS: 172 U/L — AB (ref 40–150)
ALT: 9 U/L (ref 0–55)
ANION GAP: 12 meq/L — AB (ref 3–11)
AST: 20 U/L (ref 5–34)
Albumin: 2.6 g/dL — ABNORMAL LOW (ref 3.5–5.0)
BUN: 15.5 mg/dL (ref 7.0–26.0)
CO2: 25 meq/L (ref 22–29)
CREATININE: 0.8 mg/dL (ref 0.6–1.1)
Calcium: 9.8 mg/dL (ref 8.4–10.4)
Chloride: 109 mEq/L (ref 98–109)
EGFR: 77 mL/min/{1.73_m2} — AB (ref >90)
GLUCOSE: 91 mg/dL (ref 70–140)
Potassium: 3.5 mEq/L (ref 3.5–5.1)
Sodium: 146 mEq/L — ABNORMAL HIGH (ref 136–145)
Total Bilirubin: 0.39 mg/dL (ref 0.20–1.20)
Total Protein: 6.6 g/dL (ref 6.4–8.3)

## 2014-06-14 LAB — CBC WITH DIFFERENTIAL/PLATELET
BASO%: 0.2 % (ref 0.0–2.0)
BASOS ABS: 0 10*3/uL (ref 0.0–0.1)
EOS%: 0.4 % (ref 0.0–7.0)
Eosinophils Absolute: 0.1 10*3/uL (ref 0.0–0.5)
HCT: 36 % (ref 34.8–46.6)
HEMOGLOBIN: 11.5 g/dL — AB (ref 11.6–15.9)
LYMPH%: 10.5 % — AB (ref 14.0–49.7)
MCH: 30.6 pg (ref 25.1–34.0)
MCHC: 31.9 g/dL (ref 31.5–36.0)
MCV: 95.7 fL (ref 79.5–101.0)
MONO#: 1.1 10*3/uL — ABNORMAL HIGH (ref 0.1–0.9)
MONO%: 7.2 % (ref 0.0–14.0)
NEUT#: 12.8 10*3/uL — ABNORMAL HIGH (ref 1.5–6.5)
NEUT%: 81.7 % — AB (ref 38.4–76.8)
Platelets: 269 10*3/uL (ref 145–400)
RBC: 3.76 10*6/uL (ref 3.70–5.45)
RDW: 21.2 % — ABNORMAL HIGH (ref 11.2–14.5)
WBC: 15.6 10*3/uL — ABNORMAL HIGH (ref 3.9–10.3)
lymph#: 1.6 10*3/uL (ref 0.9–3.3)
nRBC: 0 % (ref 0–0)

## 2014-06-14 LAB — TSH CHCC: TSH: 30.669 m(IU)/L — ABNORMAL HIGH (ref 0.308–3.960)

## 2014-06-14 MED ORDER — SODIUM CHLORIDE 0.9 % IV SOLN
Freq: Once | INTRAVENOUS | Status: AC
  Administered 2014-06-14: 11:00:00 via INTRAVENOUS

## 2014-06-14 MED ORDER — ZOLPIDEM TARTRATE 5 MG PO TABS: 5.0000 mg | ORAL_TABLET | Freq: Every evening | ORAL | Status: AC | PRN

## 2014-06-14 MED ORDER — LEVOTHYROXINE SODIUM 50 MCG PO TABS: 50.0000 ug | ORAL_TABLET | Freq: Every day | ORAL | Status: AC

## 2014-06-14 MED ORDER — SODIUM CHLORIDE 0.9 % IV SOLN
3.0000 mg/kg | Freq: Once | INTRAVENOUS | Status: AC
  Administered 2014-06-14: 100 mg via INTRAVENOUS
  Filled 2014-06-14: qty 10

## 2014-06-14 NOTE — Progress Notes (Signed)
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  2) locally advanced adenocarcinoma of the stomach status post resection in July of 2002 followed by concurrent chemoradiation under the care of Dr. Lisbeth Renshaw and Dr. Jamse Arn.  PRIOR THERAPY: 1) status post wedge resection of the right upper lobe under the care of Dr. Servando Snare on 09/16/2011 and the final pathology was consistent with a spindle cell carcinoma. 2) status post curative radiotherapy to the recurrent disease in the right lung under the care of Dr. Tammi Klippel completed on 01/10/2013. 3) status post stereotactic radiotherapy to the metastatic deposits in the lung completed 06/03/2013 under the care of Dr. Tammi Klippel. 4) Systemic chemotherapy with carboplatin for AUC of 5 and paclitaxel 175 mg/M2 every 3 weeks with Neulasta support. First dose 11/30/2013. Status post 3 cycles. Discontinued secondary to disease progression. 5) Systemic chemotherapy with gemcitabine 1000 mg/M2 on days 1 and 8 every 3 weeks. Status post 3 cycles discontinued secondary to disease progression.  CURRENT THERAPY: Immunotherapy with Nivolumab 3 MG/M2 every 2 weeks status post 3 cycles.   INTERVAL HISTORY: [name] 81 y.o. female returns to the clinic today for followup visit. She is currently on immunotherapy with Nivolumab status post 3 cycles and tolerated  her treatment fairly well. She reports some swelling in her feet, right side and right breast. She requests a refill for her Ambien.  She denied having any significant fever or chills, no nausea or vomiting. The patient denied having any significant chest pain, shortness of breath, cough or hemoptysis. She denied diarrhea.   She is here today to start cycle #4 of her immunotherapy.  MEDICAL HISTORY: Past Medical History  Diagnosis Date  . Dyslipidemia   . Arthritis     Hx of osteoarthritis  . Shortness of breath     WITH EXERTION   . COPD (chronic obstructive pulmonary disease)   . Anemia   . History of chemotherapy     carboplatin/taxol  . Lung mass   . 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 History of radiation therapy 01/13/11-02/20/11    abdomen    ALLERGIES:  is allergic to atarax.  MEDICATIONS:  Current Outpatient Prescriptions  Medication Sig Dispense Refill  . albuterol (PROVENTIL HFA;VENTOLIN HFA) 108 (90 BASE) MCG/ACT inhaler Inhale 3 puffs into the lungs every 4 (four) hours as needed. For shortness of breath     . calcium-vitamin D (OSCAL WITH D) 500-200 MG-UNIT per tablet Take 1 tablet by mouth every other day.     . cholecalciferol (VITAMIN D) 1000 UNITS tablet Take 1,000 Units by mouth daily.    . clobetasol cream (TEMOVATE) 0.05 %     . Cyanocobalamin (B-12) 100 MCG TABS Take 1 tablet by mouth as needed.     . DULoxetine (CYMBALTA) 30 MG capsule Take 1 capsule (30 mg total) by mouth daily. 30 capsule 3  . Ferrous Sulfate (IRON) 325 (65 FE) MG TABS Take 1 tablet by mouth daily.     . fish oil-omega-3 fatty acids 1000 MG capsule Take 1 g by mouth daily.     Marland Kitchen HYDROcodone-acetaminophen (NORCO) 5-325 MG per tablet Take 1 tablet by mouth every 6 (six) hours as needed for moderate pain. [place]  tablet 0  . Multiple Vitamins-Minerals (CENTRUM SILVER PO) Take 1 tablet by mouth daily.     . prochlorperazine (COMPAZINE) 10 MG tablet Take 1 tablet (10 mg total) by mouth every 6 (six) hours as needed for nausea or vomiting. 30 tablet 1  . valACYclovir (VALTREX) 500 MG tablet Take 1 tablet (500 mg total) by mouth 2 (two) times daily. 14 tablet 0  . zolpidem (AMBIEN) 5 MG tablet Take 1 tablet (5 mg total) by mouth at bedtime as needed for sleep. 30 tablet 0  . levothyroxine (SYNTHROID, LEVOTHROID) 50 MCG tablet  Take 1 tablet (50 mcg total) by mouth daily before breakfast. 30 tablet 1   No current facility-administered medications for this visit.    SURGICAL HISTORY:  Past Surgical History  Procedure Laterality Date  . Esophagogastrectomy, j tube 11/08/2010    . Cholecystectomy    . Abdominal hysterectomy    . Cholecystectomy    . Cataract extraction      RIGHT EYE   . Port-a-cath removal  05/13/2011    Procedure: REMOVAL PORT-A-CATH;  Surgeon: Stark Klein, MD;  Location: [facility];  Service: General;  Laterality: N/A;  Removal port-a-cath.  . Portacath placement    . Eye surgery    . Bronchoscopy  08/28/11    rt upper lung mass bx  . Cardia resected  11/2010  . Wedge resection  09/18/11    right vats Dr. Lanelle Bal    REVIEW OF SYSTEMS:  Constitutional: positive for anorexia and fatigue Eyes: negative Ears, nose, mouth, throat, and face: negative Respiratory: negative Cardiovascular: positive for lower extremity edema and also notes swelling of the right side  of her body Gastrointestinal: negative Genitourinary:negative Integument/breast: positive for right breast larger than left over past several weeks. Last mammogram at age 81 per patient Hematologic/lymphatic: negative Musculoskeletal:negative Neurological: negative Behavioral/Psych: negative Endocrine: negative Allergic/Immunologic: negative   PHYSICAL EXAMINATION: General appearance: alert, cooperative and no distress Head: Normocephalic, without obvious abnormality, atraumatic Neck: no adenopathy, no JVD, supple, symmetrical, trachea midline and thyroid not enlarged, symmetric, no tenderness/mass/nodules Lymph nodes: Cervical, supraclavicular, and axillary nodes normal. Resp: rhonchi RLL Back: symmetric, no curvature. ROM normal. No CVA tenderness. Cardio: regular rate and rhythm, S1, S2 normal, no murmur, click, rub or gallop GI: soft, non-tender; bowel sounds normal; no masses,  no organomegaly Extremities: edema 1+ to  2+ edema bilateral lower extremities Neurologic: Alert and oriented X 3, normal strength and tone. Normal symmetric reflexes. Normal coordination and gait Breasts: Right breast full without discrete masses or skin changes, nontender with a well-healed incision on approximately 9:00 from a previous biopsy. Left breast flat without palpable masses or skin changes. Nothing palpable in either axilla  ECOG PERFORMANCE STATUS: 1 - Symptomatic but completely ambulatory  Blood pressure 117/69, temperature 97.9 F (36.6 C), temperature source Axillary, resp. rate 18, height 4\' 11"  (1.499 m), weight 93 lb 4.8 oz (42.321 kg).  LABORATORY DATA: Lab Results  Component Value Date   WBC 15.6* 06/14/2014   HGB 11.5* 06/14/2014   HCT 36.0 06/14/2014   MCV 95.7 06/14/2014   PLT 269 06/14/2014      Chemistry      Component Value Date/Time   NA 146* 06/14/2014 0822   NA 141 02/22/2014 0852   NA 143 07/23/2011 0916   K 3.5 06/14/2014 0822   K 4.1 02/22/2014 0852   K 3.8 07/23/2011 0916   CL 106 02/22/2014 0852   CL 104 07/23/2011 0916   CO2  25 06/14/2014 0822   CO2 24 02/22/2014 0852   CO2 23 07/23/2011 0916   BUN 15.5 06/14/2014 0822   BUN 12 02/22/2014 0852   BUN 7 07/23/2011 0916   CREATININE 0.8 06/14/2014 0822   CREATININE 0.89 02/22/2014 0852   CREATININE 0.8 07/23/2011 0916      Component Value Date/Time   CALCIUM 9.8 06/14/2014 0822   CALCIUM 9.3 02/22/2014 0852   CALCIUM 8.9 07/23/2011 0916   ALKPHOS 172* 06/14/2014 0822   ALKPHOS 121* 02/22/2014 0852   ALKPHOS 109* 07/23/2011 0916   AST 20 06/14/2014 0822   AST 24 02/22/2014 0852   AST 31 07/23/2011 0916   ALT 9 06/14/2014 0822   ALT 12 02/22/2014 0852   ALT 22 07/23/2011 0916   BILITOT 0.39 06/14/2014 0822   BILITOT 0.2* 02/22/2014 0852   BILITOT 0.60 07/23/2011 0916       RADIOGRAPHIC STUDIES:  ASSESSMENT AND PLAN: This is a very pleasant 81 years old African American female with recurrent spindle cell carcinoma  of the right lung consistent with sarcomatoid carcinoma, status post curative radiotherapy completed on 01/10/2013.  She also underwent a course of stereotactic radiotherapy to the pleural/subpleural area under the care of Dr. Tammi Klippel. Her recent scan showed evidence for disease progression with increased pleural and subpleural metastatic disease in the lateral right hemithorax. She underwent systemic chemotherapy with carboplatin and paclitaxel is status post 3 cycles and tolerating her treatment fairly well with no significant adverse effects except for the peripheral neuropathy and this was discontinued secondary to disease progression. The patient also received 3 cycles of systemic chemotherapy with single agent gemcitabine but discontinued secondary to disease progression. She is currently undergoing immunotherapy with Nivolumab status post 3 cycles and tolerating it well. Patient was discussed with  Dr. Alen Blew. She will proceed with cycle #4 today as a scheduled. Her TSH level has been increasing and most recently had gone from 5-12. TSH is pending from today. She will be started on levothyroxine at 50 g by mouth daily. A prescription for this medication was sent her pharmacy of record the patient understands how to take this medication. Her Ambien 5 mg tablets, 1 by mouth daily at bedtime when necessary insomnia total of 30 with no refill prescription was given to the patient in clinic today. She will come back for a follow-up visit in 2 weeks for reevaluation before starting cycle #5. When she returns in 2 weeks she will return with a restaging CT scan of the chest, abdomen and pelvis to reevaluate her disease.  She was advised to call immediately if she has any concerning symptoms in the interval.  The patient voices understanding of current disease status and treatment options and is in agreement with the current care plan.  All questions were answered. The patient knows to call the clinic  with any problems, questions or concerns. We can certainly see the patient much sooner if necessary.  Carlton Adam, PA-C 06/14/2014

## 2014-06-14 NOTE — Telephone Encounter (Signed)
Per staff message and POF I have scheduled appts. Advised scheduler of appts. JMW  

## 2014-06-14 NOTE — Patient Instructions (Signed)
Your thyroid studies are abnormal. Take the thyroid medication as prescribed Follow up in 2 weeks with a restaging Ct scan of your chest, abdomen and pelvis to re-evaluate your disease

## 2014-06-14 NOTE — Patient Instructions (Signed)
[facility] Discharge Instructions for Patients Receiving Chemotherapy  Today you received the following chemotherapy agents Nivolumab  To help prevent nausea and vomiting after your treatment, we encourage you to take your nausea medication     If you develop nausea and vomiting that is not controlled by your nausea medication, call the clinic.   BELOW ARE SYMPTOMS THAT SHOULD BE REPORTED IMMEDIATELY:  *FEVER GREATER THAN 100.5 F  *CHILLS WITH OR WITHOUT FEVER  NAUSEA AND VOMITING THAT IS NOT CONTROLLED WITH YOUR NAUSEA MEDICATION  *UNUSUAL SHORTNESS OF BREATH  *UNUSUAL BRUISING OR BLEEDING  TENDERNESS IN MOUTH AND THROAT WITH OR WITHOUT PRESENCE OF ULCERS  *URINARY PROBLEMS  *BOWEL PROBLEMS  UNUSUAL RASH Items with * indicate a potential emergency and should be followed up as soon as possible.  Feel free to call the clinic you have any questions or concerns. The clinic phone number is [phone].

## 2014-06-14 NOTE — Telephone Encounter (Signed)
Gave avs & calendar for February. Sent message to schedule treatment. °

## 2014-06-19 ENCOUNTER — Emergency Department (HOSPITAL_COMMUNITY): Payer: Medicare Other

## 2014-06-19 ENCOUNTER — Encounter (HOSPITAL_COMMUNITY): Payer: Self-pay

## 2014-06-19 ENCOUNTER — Inpatient Hospital Stay (HOSPITAL_COMMUNITY)
Admission: EM | Admit: 2014-06-19 | Discharge: 2014-06-23 | DRG: 186 | Disposition: A | Discharge status: Hospice | Payer: Medicare Other | Attending: Internal Medicine | Admitting: Internal Medicine

## 2014-06-19 DIAGNOSIS — J910 Malignant pleural effusion: Secondary | ICD-10-CM | POA: Diagnosis present

## 2014-06-19 DIAGNOSIS — M1990 Unspecified osteoarthritis, unspecified site: Secondary | ICD-10-CM | POA: Diagnosis present

## 2014-06-19 DIAGNOSIS — Z85028 Personal history of other malignant neoplasm of stomach: Secondary | ICD-10-CM

## 2014-06-19 DIAGNOSIS — J449 Chronic obstructive pulmonary disease, unspecified: Secondary | ICD-10-CM | POA: Diagnosis present

## 2014-06-19 DIAGNOSIS — Z923 Personal history of irradiation: Secondary | ICD-10-CM

## 2014-06-19 DIAGNOSIS — R0600 Dyspnea, unspecified: Secondary | ICD-10-CM

## 2014-06-19 DIAGNOSIS — Z66 Do not resuscitate: Secondary | ICD-10-CM | POA: Diagnosis present

## 2014-06-19 DIAGNOSIS — Z888 Allergy status to other drugs, medicaments and biological substances status: Secondary | ICD-10-CM

## 2014-06-19 DIAGNOSIS — J90 Pleural effusion, not elsewhere classified: Secondary | ICD-10-CM | POA: Diagnosis present

## 2014-06-19 DIAGNOSIS — R64 Cachexia: Secondary | ICD-10-CM | POA: Diagnosis present

## 2014-06-19 DIAGNOSIS — J9601 Acute respiratory failure with hypoxia: Secondary | ICD-10-CM | POA: Diagnosis present

## 2014-06-19 DIAGNOSIS — Z87891 Personal history of nicotine dependence: Secondary | ICD-10-CM

## 2014-06-19 DIAGNOSIS — Z515 Encounter for palliative care: Secondary | ICD-10-CM

## 2014-06-19 DIAGNOSIS — R0902 Hypoxemia: Secondary | ICD-10-CM | POA: Diagnosis present

## 2014-06-19 DIAGNOSIS — C3490 Malignant neoplasm of unspecified part of unspecified bronchus or lung: Secondary | ICD-10-CM | POA: Diagnosis present

## 2014-06-19 DIAGNOSIS — E43 Unspecified severe protein-calorie malnutrition: Secondary | ICD-10-CM | POA: Diagnosis present

## 2014-06-19 DIAGNOSIS — R0602 Shortness of breath: Secondary | ICD-10-CM | POA: Diagnosis not present

## 2014-06-19 DIAGNOSIS — Z9889 Other specified postprocedural states: Secondary | ICD-10-CM

## 2014-06-19 DIAGNOSIS — Z79899 Other long term (current) drug therapy: Secondary | ICD-10-CM

## 2014-06-19 DIAGNOSIS — E039 Hypothyroidism, unspecified: Secondary | ICD-10-CM | POA: Diagnosis present

## 2014-06-19 DIAGNOSIS — C801 Malignant (primary) neoplasm, unspecified: Secondary | ICD-10-CM | POA: Diagnosis present

## 2014-06-19 DIAGNOSIS — R531 Weakness: Secondary | ICD-10-CM

## 2014-06-19 DIAGNOSIS — Z9221 Personal history of antineoplastic chemotherapy: Secondary | ICD-10-CM

## 2014-06-19 DIAGNOSIS — C3491 Malignant neoplasm of unspecified part of right bronchus or lung: Secondary | ICD-10-CM

## 2014-06-19 DIAGNOSIS — D72829 Elevated white blood cell count, unspecified: Secondary | ICD-10-CM | POA: Diagnosis present

## 2014-06-19 DIAGNOSIS — E785 Hyperlipidemia, unspecified: Secondary | ICD-10-CM | POA: Diagnosis present

## 2014-06-19 DIAGNOSIS — Z79891 Long term (current) use of opiate analgesic: Secondary | ICD-10-CM

## 2014-06-19 DIAGNOSIS — I509 Heart failure, unspecified: Secondary | ICD-10-CM

## 2014-06-19 DIAGNOSIS — Z809 Family history of malignant neoplasm, unspecified: Secondary | ICD-10-CM

## 2014-06-19 LAB — URINALYSIS, ROUTINE W REFLEX MICROSCOPIC
Glucose, UA: NEGATIVE mg/dL
Hgb urine dipstick: NEGATIVE
KETONES UR: 15 mg/dL — AB
Leukocytes, UA: NEGATIVE
Nitrite: NEGATIVE
PROTEIN: NEGATIVE mg/dL
Specific Gravity, Urine: 1.024 (ref 1.005–1.030)
UROBILINOGEN UA: 0.2 mg/dL (ref 0.0–1.0)
pH: 5 (ref 5.0–8.0)

## 2014-06-19 LAB — BRAIN NATRIURETIC PEPTIDE: B Natriuretic Peptide: 696.5 pg/mL — ABNORMAL HIGH (ref 0.0–100.0)

## 2014-06-19 LAB — CBC WITH DIFFERENTIAL/PLATELET
Basophils Absolute: 0 10*3/uL (ref 0.0–0.1)
Basophils Relative: 0 % (ref 0–1)
Eosinophils Absolute: 0 10*3/uL (ref 0.0–0.7)
Eosinophils Relative: 0 % (ref 0–5)
HCT: 39 % (ref 36.0–46.0)
Hemoglobin: 12.6 g/dL (ref 12.0–15.0)
Lymphocytes Relative: 7 % — ABNORMAL LOW (ref 12–46)
Lymphs Abs: 1.1 10*3/uL (ref 0.7–4.0)
MCH: 31.3 pg (ref 26.0–34.0)
MCHC: 32.3 g/dL (ref 30.0–36.0)
MCV: 96.8 fL (ref 78.0–100.0)
Monocytes Absolute: 1.1 10*3/uL — ABNORMAL HIGH (ref 0.1–1.0)
Monocytes Relative: 7 % (ref 3–12)
Neutro Abs: 13.6 10*3/uL — ABNORMAL HIGH (ref 1.7–7.7)
Neutrophils Relative %: 86 % — ABNORMAL HIGH (ref 43–77)
PLATELETS: 270 10*3/uL (ref 150–400)
RBC: 4.03 MIL/uL (ref 3.87–5.11)
RDW: 20.8 % — ABNORMAL HIGH (ref 11.5–15.5)
WBC: 15.9 10*3/uL — ABNORMAL HIGH (ref 4.0–10.5)

## 2014-06-19 LAB — COMPREHENSIVE METABOLIC PANEL
ALBUMIN: 2.6 g/dL — AB (ref 3.5–5.2)
ALT: 9 U/L (ref 0–35)
ANION GAP: 7 (ref 5–15)
AST: 24 U/L (ref 0–37)
Alkaline Phosphatase: 130 U/L — ABNORMAL HIGH (ref 39–117)
BILIRUBIN TOTAL: 0.8 mg/dL (ref 0.3–1.2)
BUN: 13 mg/dL (ref 6–23)
CO2: 25 mmol/L (ref 19–32)
Calcium: 10.1 mg/dL (ref 8.4–10.5)
Chloride: 106 mmol/L (ref 96–112)
Creatinine, Ser: 0.83 mg/dL (ref 0.50–1.10)
GFR calc non Af Amer: 64 mL/min — ABNORMAL LOW (ref >90)
GFR, EST AFRICAN AMERICAN: 75 mL/min — AB (ref >90)
Glucose, Bld: 66 mg/dL — ABNORMAL LOW (ref 70–99)
POTASSIUM: 4.1 mmol/L (ref 3.5–5.1)
SODIUM: 138 mmol/L (ref 135–145)
Total Protein: 6.1 g/dL (ref 6.0–8.3)

## 2014-06-19 LAB — I-STAT TROPONIN, ED: TROPONIN I, POC: 0.03 ng/mL (ref 0.00–0.08)

## 2014-06-19 MED ORDER — IPRATROPIUM-ALBUTEROL 0.5-2.5 (3) MG/3ML IN SOLN
3.0000 mL | Freq: Once | RESPIRATORY_TRACT | Status: AC
Start: 2014-06-19 — End: 2014-06-19
  Administered 2014-06-19: 3 mL via RESPIRATORY_TRACT
  Filled 2014-06-19: qty 3

## 2014-06-19 NOTE — ED Notes (Addendum)
Pt presents with c/o shortness of breath that started approx 3 weeks ago but has gotten worse over the weekend. Family reports she hasn't been out of the bed this weekend.

## 2014-06-19 NOTE — ED Notes (Signed)
Unsuccessful blood draw, notified nurse

## 2014-06-19 NOTE — ED Notes (Signed)
Unsuccessful attempts at IV insertion via [place]. MD Tawnya Crook made aware.

## 2014-06-19 NOTE — ED Provider Notes (Signed)
CSN: [id number]     Arrival date & time 06/19/14  1815 History   First MD Initiated Contact with Patient 06/19/14 1846     Chief Complaint  Patient presents with  . Shortness of Breath     (Consider location/radiation/quality/duration/timing/severity/associated sxs/prior Treatment) Patient is a 81 y.o. female presenting with shortness of breath. The history is provided by the patient and a relative. No language interpreter was used.  Shortness of Breath Severity:  Moderate Onset quality:  Gradual Duration:  4 weeks Timing:  Constant Progression:  Worsening Context: activity   Relieved by:  Rest Worsened by:  Movement Ineffective treatments: cough syrup. Associated symptoms: cough   Associated symptoms: no abdominal pain, no chest pain, no diaphoresis, no fever, no sore throat and no sputum production   Associated symptoms comment:  Leg swelling Risk factors: hx of cancer   Risk factors: no family hx of DVT, no hx of PE/DVT, no prolonged immobilization, no recent surgery and no tobacco use     Past Medical History  Diagnosis Date  . Dyslipidemia   . Arthritis     Hx of osteoarthritis  . Shortness of breath     WITH EXERTION   . COPD (chronic obstructive pulmonary disease)   . Anemia   . History of chemotherapy     carboplatin/taxol  . Lung mass   . 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 History of radiation therapy 01/13/11-02/20/11    abdomen   Past Surgical History  Procedure Laterality Date  . Esophagogastrectomy, j tube 11/08/2010    . Cholecystectomy    . Abdominal hysterectomy    . Cholecystectomy    . Cataract extraction      RIGHT EYE   . Port-a-cath removal  05/13/2011    Procedure: REMOVAL PORT-A-CATH;  Surgeon: Stark Klein, MD;  Location: [facility];  Service: General;  Laterality: N/A;  Removal port-a-cath.  . Portacath placement    . Eye surgery    . Bronchoscopy  08/28/11    rt upper lung mass bx  . Cardia resected  11/2010  . Wedge resection  09/18/11    right  vats Dr. Lanelle Bal   Family History  Problem Relation Age of Onset  . Cancer Sister     unknown   History  Substance Use Topics  . Smoking status: Former Smoker -- 0.25 packs/day for 30 years    Types: Cigarettes    Quit date: 05/16/1988  . Smokeless tobacco: Never Used  . Alcohol Use: No   OB History    No data available     Review of Systems  Constitutional: Negative for fever and diaphoresis.  HENT: Negative for sore throat.   Respiratory: Positive for cough and shortness of breath. Negative for sputum production.   Cardiovascular: Negative for chest pain.  Gastrointestinal: Negative for abdominal pain.      Allergies  Atarax  Home Medications   Prior to Admission medications   Medication Sig Start Date End Date Taking? Authorizing Provider  albuterol (PROVENTIL HFA;VENTOLIN HFA) 108 (90 BASE) MCG/ACT inhaler Inhale 2 puffs into the lungs every 4 (four) hours as needed for shortness of breath (shortness of breath).    Yes Historical Provider, MD  Cyanocobalamin (B-12) 100 MCG TABS Take 1 tablet by mouth daily.    Yes Historical Provider, MD  DULoxetine (CYMBALTA) 30 MG capsule Take 1 capsule (30 mg total) by mouth daily. 02/22/14  Yes Curt Bears, MD  HYDROcodone-acetaminophen (NORCO) 5-325 MG per tablet  Take 1 tablet by mouth every 6 (six) hours as needed for moderate pain. 06/01/14  Yes Carlton Adam, PA-C  levothyroxine (SYNTHROID, LEVOTHROID) 50 MCG tablet Take 1 tablet (50 mcg total) by mouth daily before breakfast. 06/14/14  Yes Adrena E Johnson, PA-C  prochlorperazine (COMPAZINE) 10 MG tablet Take 1 tablet (10 mg total) by mouth every 6 (six) hours as needed for nausea or vomiting. 02/08/14  Yes Owens Shark, NP  zolpidem (AMBIEN) 5 MG tablet Take 1 tablet (5 mg total) by mouth at bedtime as needed for sleep. 06/14/14  Yes Adrena E Johnson, PA-C  calcium-vitamin D (OSCAL WITH D) 500-200 MG-UNIT per tablet Take 1 tablet by mouth every other day.      Historical Provider, MD  cholecalciferol (VITAMIN D) 1000 UNITS tablet Take 1,000 Units by mouth daily.    Historical Provider, MD  clobetasol cream (TEMOVATE) 1.61 % Apply 1 application topically 2 (two) times daily.  11/27/13   Historical Provider, MD  Ferrous Sulfate (IRON) 325 (65 FE) MG TABS Take 1 tablet by mouth daily.     Historical Provider, MD  fish oil-omega-3 fatty acids 1000 MG capsule Take 1 g by mouth daily.     Historical Provider, MD  Multiple Vitamins-Minerals (CENTRUM SILVER PO) Take 1 tablet by mouth daily.     Historical Provider, MD  valACYclovir (VALTREX) 500 MG tablet Take 1 tablet (500 mg total) by mouth 2 (two) times daily. 05/17/14   Curt Bears, MD   BP 140/80 mmHg  Pulse 90  Temp(Src) 97.9 F (36.6 C) (Oral)  Resp 18  SpO2 94% Physical Exam  Constitutional: She is oriented to person, place, and time. She appears cachectic. No distress.  HENT:  Head: Normocephalic and atraumatic.  Mouth/Throat: No oropharyngeal exudate.  Eyes: Pupils are equal, round, and reactive to light.  Neck: Normal range of motion. Neck supple.  Cardiovascular: Normal rate, regular rhythm and normal heart sounds.  Exam reveals no gallop and no friction rub.   No murmur heard. Pulmonary/Chest: Effort normal. No respiratory distress. She has wheezes in the right middle field, the right lower field, the left middle field and the left lower field. She has rales in the right lower field and the left lower field.  Abdominal: Soft. Bowel sounds are normal. She exhibits no distension and no mass. There is no tenderness. There is no rebound and no guarding.  Musculoskeletal: Normal range of motion. She exhibits no edema or tenderness.  Neurological: She is alert and oriented to person, place, and time.  Skin: Skin is warm and dry.  Psychiatric: She has a normal mood and affect.    ED Course  Procedures (including critical care time) Labs Review Labs Reviewed  CBC WITH  DIFFERENTIAL/PLATELET - Abnormal; Notable for the following:    WBC 15.9 (*)    RDW 20.8 (*)    Neutrophils Relative % 86 (*)    Neutro Abs 13.6 (*)    Lymphocytes Relative 7 (*)    Monocytes Absolute 1.1 (*)    All other components within normal limits  COMPREHENSIVE METABOLIC PANEL - Abnormal; Notable for the following:    Glucose, Bld 66 (*)    Albumin 2.6 (*)    Alkaline Phosphatase 130 (*)    GFR calc non Af Amer 64 (*)    GFR calc Af Amer 75 (*)    All other components within normal limits  URINALYSIS, ROUTINE W REFLEX MICROSCOPIC - Abnormal; Notable for the following:  Bilirubin Urine SMALL (*)    Ketones, ur 15 (*)    All other components within normal limits  BRAIN NATRIURETIC PEPTIDE - Abnormal; Notable for the following:    B Natriuretic Peptide 696.5 (*)    All other components within normal limits  URINE CULTURE  I-STAT TROPOININ, ED    Imaging Review Dg Chest 2 View  06/19/2014   CLINICAL DATA:  Weakness and shortness of breath for 3 weeks, worse in tonight. 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  EXAM: CHEST  2 VIEW  COMPARISON:  CT chest 03/29/2014 and single view of the chest 11/01/2012.  FINDINGS: There has been marked worsening in the patient's right pleural effusion and airspace opacity in the right mid and lower lung zones consistent with progressive carcinoma. There is a new small left pleural effusion and mild basilar atelectasis. Destructive change of the sixth and seventh ribs consistent with tumor invasion is identified as seen on prior CT scan.  IMPRESSION: Marked worsening of carcinoma and associated right effusion in the right chest.  New small left pleural effusion and basilar atelectasis.   Electronically Signed   By: Inge Rise M.D.   On: 06/19/2014 21:36     EKG Interpretation   Date/Time:  Monday June 19 2014 18:49:36 EST Ventricular Rate:  85 PR Interval:  140 QRS Duration: 62 QT Interval:  535 QTC  Calculation: 636 R Axis:   61 Text Interpretation:  Sinus rhythm Anterior infarct, age indeterminate  Prolonged QT interval Baseline wander in lead(s) V1 No significant change  since last tracing Confirmed by DOCHERTY  MD, MEGAN [id number]) on 06/19/2014  7:01:24 PM      MDM   Final diagnoses:  Hypoxia  Malignant neoplasm of right lung, unspecified part of lung  Pleural effusion, bilateral  Acute congestive heart failure, unspecified congestive heart failure type    Pt is a 81 y.o. female with Pmhx as above who presents with gradual onset SOB, worse with exertion for 3-4 weeks and BLLE edema for 1 week. She has had some cough, denies chest pain, fevers, abdominal pain.  She has had anorexia, but is tolerating liquids well.  On physical exam she is cachectic appearing.  She has scattered wheezing and rales in her lower lung fields.  She has 2+ nontender pitting edema bilaterally.  After much difficulty with her for IV access.  Ultrasound guided IV placed by Dr. Aline Brochure.  Chest x-ray with marked worsening of carcinoma and associated right effusion of the right chest as well as new small left pleural effusion.  White blood cell count is elevated at 15.9.  Creatinine is normal.  Urine is not infected.  Patient ambulated in the hall and dropped sats to 76%.  She does not have home oxygen. BNP alos elevated and given rales & BLLE edema, there is likely a component of heart failure.  Will speak to [facility] for admission, consideration of thoracentesis, diuresis. Ernestina Patches, MD 06/20/14 0010

## 2014-06-19 NOTE — ED Notes (Signed)
Dr. Tawnya Crook at bedside to attempt EJ. Leone Haven, RN in to assist.

## 2014-06-19 NOTE — ED Provider Notes (Signed)
Procedure note: Ultrasound Guided Peripheral IV Ultrasound guided 20 g peripheral 1.88 inch angiocath IV placement performed by me. Indications: Nursing unable to place IV. Details: The right antecubital fossa and upper arm was evaluated with a multifrequency linear probe. Several patent brachial veins are noted. 1 attempts were made to cannulate a right basilic vein under realtime US guidance with successful cannulation of the vein and catheter placement. There is return of non-pulsatile dark red blood. The patient tolerated the procedure well without complications.      Pamella Pert, MD 06/19/14 2321

## 2014-06-19 NOTE — ED Notes (Signed)
While ambulating patient her oxygen level was at 76 percent. Pt seemed to be very short of breath.

## 2014-06-19 NOTE — ED Notes (Signed)
Per IV-team RN - pt's family refusing any further IV sticks.

## 2014-06-20 ENCOUNTER — Encounter (HOSPITAL_COMMUNITY): Payer: Self-pay | Admitting: Internal Medicine

## 2014-06-20 ENCOUNTER — Inpatient Hospital Stay (HOSPITAL_COMMUNITY): Payer: Medicare Other

## 2014-06-20 DIAGNOSIS — C782 Secondary malignant neoplasm of pleura: Secondary | ICD-10-CM | POA: Diagnosis not present

## 2014-06-20 DIAGNOSIS — Z79891 Long term (current) use of opiate analgesic: Secondary | ICD-10-CM | POA: Diagnosis not present

## 2014-06-20 DIAGNOSIS — C3411 Malignant neoplasm of upper lobe, right bronchus or lung: Secondary | ICD-10-CM | POA: Diagnosis not present

## 2014-06-20 DIAGNOSIS — R64 Cachexia: Secondary | ICD-10-CM | POA: Diagnosis present

## 2014-06-20 DIAGNOSIS — J9601 Acute respiratory failure with hypoxia: Secondary | ICD-10-CM | POA: Diagnosis present

## 2014-06-20 DIAGNOSIS — Z79899 Other long term (current) drug therapy: Secondary | ICD-10-CM | POA: Diagnosis not present

## 2014-06-20 DIAGNOSIS — M1990 Unspecified osteoarthritis, unspecified site: Secondary | ICD-10-CM | POA: Diagnosis present

## 2014-06-20 DIAGNOSIS — R0902 Hypoxemia: Secondary | ICD-10-CM | POA: Diagnosis present

## 2014-06-20 DIAGNOSIS — D72829 Elevated white blood cell count, unspecified: Secondary | ICD-10-CM | POA: Diagnosis present

## 2014-06-20 DIAGNOSIS — R079 Chest pain, unspecified: Secondary | ICD-10-CM | POA: Diagnosis not present

## 2014-06-20 DIAGNOSIS — E039 Hypothyroidism, unspecified: Secondary | ICD-10-CM

## 2014-06-20 DIAGNOSIS — J90 Pleural effusion, not elsewhere classified: Principal | ICD-10-CM

## 2014-06-20 DIAGNOSIS — Z888 Allergy status to other drugs, medicaments and biological substances status: Secondary | ICD-10-CM | POA: Diagnosis not present

## 2014-06-20 DIAGNOSIS — R0602 Shortness of breath: Secondary | ICD-10-CM | POA: Diagnosis present

## 2014-06-20 DIAGNOSIS — Z9221 Personal history of antineoplastic chemotherapy: Secondary | ICD-10-CM | POA: Diagnosis not present

## 2014-06-20 DIAGNOSIS — Z66 Do not resuscitate: Secondary | ICD-10-CM | POA: Diagnosis present

## 2014-06-20 DIAGNOSIS — Z87891 Personal history of nicotine dependence: Secondary | ICD-10-CM | POA: Diagnosis not present

## 2014-06-20 DIAGNOSIS — C3490 Malignant neoplasm of unspecified part of unspecified bronchus or lung: Secondary | ICD-10-CM | POA: Diagnosis present

## 2014-06-20 DIAGNOSIS — Z515 Encounter for palliative care: Secondary | ICD-10-CM | POA: Diagnosis not present

## 2014-06-20 DIAGNOSIS — R0600 Dyspnea, unspecified: Secondary | ICD-10-CM

## 2014-06-20 DIAGNOSIS — Z923 Personal history of irradiation: Secondary | ICD-10-CM | POA: Diagnosis not present

## 2014-06-20 DIAGNOSIS — J449 Chronic obstructive pulmonary disease, unspecified: Secondary | ICD-10-CM | POA: Diagnosis present

## 2014-06-20 DIAGNOSIS — E785 Hyperlipidemia, unspecified: Secondary | ICD-10-CM | POA: Diagnosis present

## 2014-06-20 DIAGNOSIS — E43 Unspecified severe protein-calorie malnutrition: Secondary | ICD-10-CM | POA: Diagnosis present

## 2014-06-20 DIAGNOSIS — C801 Malignant (primary) neoplasm, unspecified: Secondary | ICD-10-CM

## 2014-06-20 DIAGNOSIS — Z809 Family history of malignant neoplasm, unspecified: Secondary | ICD-10-CM | POA: Diagnosis not present

## 2014-06-20 DIAGNOSIS — Z85028 Personal history of other malignant neoplasm of stomach: Secondary | ICD-10-CM | POA: Diagnosis not present

## 2014-06-20 LAB — URINE CULTURE: Colony Count: 10000

## 2014-06-20 LAB — TROPONIN I
TROPONIN I: 0.07 ng/mL — AB (ref <0.031)
Troponin I: 0.06 ng/mL — ABNORMAL HIGH (ref <0.031)

## 2014-06-20 LAB — TSH: TSH: 25.035 u[IU]/mL — AB (ref 0.350–4.500)

## 2014-06-20 MED ORDER — CALCIUM CARBONATE-VITAMIN D 500-200 MG-UNIT PO TABS
1.0000 | ORAL_TABLET | ORAL | Status: DC
  Administered 2014-06-20 – 2014-06-22 (×2): 1 via ORAL
  Filled 2014-06-20: qty 1
  Filled 2014-06-20: qty 2

## 2014-06-20 MED ORDER — ACETAMINOPHEN 325 MG PO TABS: 650.0000 mg | ORAL_TABLET | Freq: Four times a day (QID) | ORAL | Status: DC | PRN

## 2014-06-20 MED ORDER — IOHEXOL 350 MG/ML SOLN
100.0000 mL | Freq: Once | INTRAVENOUS | Status: AC | PRN
  Administered 2014-06-20: 100 mL via INTRAVENOUS

## 2014-06-20 MED ORDER — DULOXETINE HCL 30 MG PO CPEP
30.0000 mg | ORAL_CAPSULE | Freq: Every day | ORAL | Status: DC
  Administered 2014-06-20 – 2014-06-23 (×4): 30 mg via ORAL
  Filled 2014-06-20 (×4): qty 1

## 2014-06-20 MED ORDER — ONDANSETRON HCL 4 MG PO TABS
4.0000 mg | ORAL_TABLET | Freq: Four times a day (QID) | ORAL | Status: DC | PRN
  Administered 2014-06-23: 4 mg via ORAL
  Filled 2014-06-20: qty 1

## 2014-06-20 MED ORDER — HYDROCODONE-ACETAMINOPHEN 5-325 MG PO TABS: 1.0000 | ORAL_TABLET | Freq: Four times a day (QID) | ORAL | Status: DC | PRN

## 2014-06-20 MED ORDER — VITAMIN D 1000 UNITS PO TABS
1000.0000 [IU] | ORAL_TABLET | Freq: Every day | ORAL | Status: DC
  Administered 2014-06-20 – 2014-06-23 (×4): 1000 [IU] via ORAL
  Filled 2014-06-20 (×4): qty 1

## 2014-06-20 MED ORDER — PROCHLORPERAZINE MALEATE 10 MG PO TABS: 10.0000 mg | ORAL_TABLET | Freq: Four times a day (QID) | ORAL | Status: DC | PRN

## 2014-06-20 MED ORDER — OMEGA-3-ACID ETHYL ESTERS 1 G PO CAPS
1.0000 g | ORAL_CAPSULE | Freq: Every day | ORAL | Status: DC
  Administered 2014-06-20 – 2014-06-23 (×4): 1 g via ORAL
  Filled 2014-06-20 (×8): qty 1

## 2014-06-20 MED ORDER — ONDANSETRON HCL 4 MG/2ML IJ SOLN: 4.0000 mg | Freq: Four times a day (QID) | INTRAMUSCULAR | Status: DC | PRN

## 2014-06-20 MED ORDER — ENOXAPARIN SODIUM 40 MG/0.4ML ~~LOC~~ SOLN: 40.0000 mg | SUBCUTANEOUS | Status: DC

## 2014-06-20 MED ORDER — VITAMIN B-12 100 MCG PO TABS
100.0000 ug | ORAL_TABLET | Freq: Every day | ORAL | Status: DC
  Administered 2014-06-20 – 2014-06-23 (×3): 100 ug via ORAL
  Filled 2014-06-20 (×5): qty 1

## 2014-06-20 MED ORDER — ZOLPIDEM TARTRATE 5 MG PO TABS
5.0000 mg | ORAL_TABLET | Freq: Every evening | ORAL | Status: DC | PRN
  Administered 2014-06-20 – 2014-06-22 (×3): 5 mg via ORAL
  Filled 2014-06-20 (×3): qty 1

## 2014-06-20 MED ORDER — LEVOTHYROXINE SODIUM 25 MCG PO TABS
50.0000 ug | ORAL_TABLET | Freq: Every day | ORAL | Status: DC
  Administered 2014-06-20 – 2014-06-23 (×4): 50 ug via ORAL
  Filled 2014-06-20 (×4): qty 2

## 2014-06-20 MED ORDER — SODIUM CHLORIDE 0.9 % IJ SOLN
3.0000 mL | Freq: Two times a day (BID) | INTRAMUSCULAR | Status: DC
  Administered 2014-06-20 – 2014-06-21 (×3): 3 mL via INTRAVENOUS

## 2014-06-20 MED ORDER — ENOXAPARIN SODIUM 30 MG/0.3ML ~~LOC~~ SOLN
30.0000 mg | Freq: Every day | SUBCUTANEOUS | Status: DC
  Administered 2014-06-20 – 2014-06-22 (×4): 30 mg via SUBCUTANEOUS
  Filled 2014-06-20 (×4): qty 0.3

## 2014-06-20 MED ORDER — FUROSEMIDE 10 MG/ML IJ SOLN
20.0000 mg | Freq: Once | INTRAMUSCULAR | Status: AC
  Administered 2014-06-20: 20 mg via INTRAVENOUS
  Filled 2014-06-20: qty 2

## 2014-06-20 MED ORDER — FUROSEMIDE 10 MG/ML IJ SOLN
20.0000 mg | Freq: Once | INTRAMUSCULAR | Status: AC
  Administered 2014-06-20: 20 mg via INTRAVENOUS
  Filled 2014-06-20: qty 4

## 2014-06-20 MED ORDER — ACETAMINOPHEN 650 MG RE SUPP: 650.0000 mg | Freq: Four times a day (QID) | RECTAL | Status: DC | PRN

## 2014-06-20 MED ORDER — SODIUM CHLORIDE 0.9 % IJ SOLN
3.0000 mL | Freq: Two times a day (BID) | INTRAMUSCULAR | Status: DC
  Administered 2014-06-20 – 2014-06-22 (×5): 3 mL via INTRAVENOUS

## 2014-06-20 MED ORDER — ALBUTEROL SULFATE (2.5 MG/3ML) 0.083% IN NEBU: 3.0000 mL | INHALATION_SOLUTION | RESPIRATORY_TRACT | Status: DC | PRN

## 2014-06-20 MED ORDER — FERROUS SULFATE 325 (65 FE) MG PO TABS
325.0000 mg | ORAL_TABLET | Freq: Every day | ORAL | Status: DC
  Administered 2014-06-20 – 2014-06-23 (×4): 325 mg via ORAL
  Filled 2014-06-20 (×4): qty 1

## 2014-06-20 NOTE — Progress Notes (Signed)
  Echocardiogram 2D Echocardiogram has been performed.  [name] [name] 06/20/2014, 2:18 PM

## 2014-06-20 NOTE — H&P (Signed)
[facility] History and Physical  HARRISON PAULSON IRJ:[id number] DOB: 1931/02/12 DOA: 06/19/2014  Referring physician: ER physician. PCP: Wenda Low, MD  Chief Complaint: Shortness of breath.  HPI: [name] is a 81 y.o. 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 Patient stated that she has been getting short of breath with increasing lower extremity edema over the last 2 weeks. Patient's shortness of breath is mostly exertional denies any associated chest pain protocol fever chills. In the ER chest x-ray shows worsening right-sided pleural effusion with patient's known cancer and there is also mild pleural effusion on left side. Patient's BNP is found to be elevated. Patient was given 1 dose of Lasix 20 mg IV and admitted for further management of her shortness of breath. Patient denies any nausea vomiting abdominal pain diarrhea.   Review of Systems: As presented in the history of presenting illness, rest negative.  Past Medical History  Diagnosis Date  . Dyslipidemia   . Arthritis     Hx of osteoarthritis  . Shortness of breath     WITH EXERTION   . COPD (chronic obstructive pulmonary disease)   . Anemia   . History of chemotherapy     carboplatin/taxol  . Lung mass   . 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 History of radiation therapy 01/13/11-02/20/11    abdomen   Past Surgical History  Procedure Laterality Date  . Esophagogastrectomy, j tube 11/08/2010    . Cholecystectomy    . Abdominal hysterectomy    . Cholecystectomy    . Cataract extraction      RIGHT EYE   . Port-a-cath removal  05/13/2011    Procedure: REMOVAL PORT-A-CATH;  Surgeon: Stark Klein, MD;  Location: [facility];  Service: General;  Laterality: N/A;  Removal port-a-cath.  . Portacath placement    . Eye surgery    . Bronchoscopy  08/28/11    rt upper lung mass bx  . Cardia resected  11/2010  . Wedge resection   09/18/11    right vats Dr. Lanelle Bal   Social History:  reports that she quit smoking about 26 years ago. Her smoking use included Cigarettes. She has a 7.5 pack-year smoking history. She has never used smokeless tobacco. She reports that she does not drink alcohol or use illicit drugs. Where does patient live home. Can patient participate in ADLs? Yes.  Allergies  Allergen Reactions  . Atarax [Hydroxyzine] Rash    States it made her break out on her lower legs.    Family History:  Family History  Problem Relation Age of Onset  . Cancer Sister     unknown      Prior to Admission medications   Medication Sig Start Date End Date Taking? Authorizing Provider  albuterol (PROVENTIL HFA;VENTOLIN HFA) 108 (90 BASE) MCG/ACT inhaler Inhale 2 puffs into the lungs every 4 (four) hours as needed for shortness of breath (shortness of breath).    Yes Historical Provider, MD  Cyanocobalamin (B-12) 100 MCG TABS Take 1 tablet by mouth daily.    Yes Historical Provider, MD  DULoxetine (CYMBALTA) 30 MG capsule Take 1 capsule (30 mg total) by mouth daily. 02/22/14  Yes Curt Bears, MD  HYDROcodone-acetaminophen (NORCO) 5-325 MG per tablet Take 1 tablet by mouth every 6 (six) hours as needed for moderate pain. 06/01/14  Yes Adrena E Johnson, PA-C  levothyroxine (SYNTHROID, LEVOTHROID) 50 MCG tablet Take  1 tablet (50 mcg total) by mouth daily before breakfast. 06/14/14  Yes Adrena E Johnson, PA-C  prochlorperazine (COMPAZINE) 10 MG tablet Take 1 tablet (10 mg total) by mouth every 6 (six) hours as needed for nausea or vomiting. 02/08/14  Yes Owens Shark, NP  zolpidem (AMBIEN) 5 MG tablet Take 1 tablet (5 mg total) by mouth at bedtime as needed for sleep. 06/14/14  Yes Adrena E Johnson, PA-C  calcium-vitamin D (OSCAL WITH D) 500-200 MG-UNIT per tablet Take 1 tablet by mouth every other day.     Historical Provider, MD  cholecalciferol (VITAMIN D) 1000 UNITS tablet Take 1,000 Units by mouth daily.     Historical Provider, MD  clobetasol cream (TEMOVATE) 1.44 % Apply 1 application topically 2 (two) times daily.  11/27/13   Historical Provider, MD  Ferrous Sulfate (IRON) 325 (65 FE) MG TABS Take 1 tablet by mouth daily.     Historical Provider, MD  fish oil-omega-3 fatty acids 1000 MG capsule Take 1 g by mouth daily.     Historical Provider, MD  Multiple Vitamins-Minerals (CENTRUM SILVER PO) Take 1 tablet by mouth daily.     Historical Provider, MD  valACYclovir (VALTREX) 500 MG tablet Take 1 tablet (500 mg total) by mouth 2 (two) times daily. 05/17/14   Curt Bears, MD    Physical Exam: Filed Vitals:   06/19/14 2145 06/20/14 0004 06/20/14 0008 06/20/14 0041  BP: 119/64 140/80  143/82  Pulse: 84 90  90  Temp:      TempSrc:      Resp: 18 18  16   SpO2: 92% 76% 94% 98%     General:  Poorly built and nourished.  Eyes: Anicteric no pallor.  ENT: No discharge from the ears eyes nose or mouth.  Neck: No mass felt. No JVD appreciated.  Cardiovascular: S1 and S2 heard.  Respiratory: No rhonchi or crepitations.  Abdomen: Soft nontender bowel sounds present.  Skin: No rash.  Musculoskeletal: No edema.  Psychiatric: Appears normal.  Neurologic: Alert awake oriented to time place and person. Moves all extremities.  Labs on Admission:  Basic Metabolic Panel:  Recent Labs Lab 06/14/14 0822 06/19/14 2245  NA 146* 138  K 3.5 4.1  CL  --  106  CO2 25 25  GLUCOSE 91 66*  BUN 15.5 13  CREATININE 0.8 0.83  CALCIUM 9.8 10.1   Liver Function Tests:  Recent Labs Lab 06/14/14 0822 06/19/14 2245  AST 20 24  ALT 9 9  ALKPHOS 172* 130*  BILITOT 0.39 0.8  PROT 6.6 6.1  ALBUMIN 2.6* 2.6*   No results for input(s): LIPASE, AMYLASE in the last 168 hours. No results for input(s): AMMONIA in the last 168 hours. CBC:  Recent Labs Lab 06/14/14 0821 06/19/14 2245  WBC 15.6* 15.9*  NEUTROABS 12.8* 13.6*  HGB 11.5* 12.6  HCT 36.0 39.0  MCV 95.7 96.8  PLT 269 270    Cardiac Enzymes: No results for input(s): CKTOTAL, CKMB, CKMBINDEX, TROPONINI in the last 168 hours.  BNP (last 3 results)  Recent Labs  06/19/14 2245  BNP 696.5*    ProBNP (last 3 results) No results for input(s): PROBNP in the last 8760 hours.  CBG: No results for input(s): GLUCAP in the last 168 hours.  Radiological Exams on Admission: Dg Chest 2 View  06/19/2014   CLINICAL DATA:  Weakness and shortness of breath for 3 weeks, worse in tonight. 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  EXAM: CHEST  2 VIEW  COMPARISON:  CT chest 03/29/2014 and single view of the chest 11/01/2012.  FINDINGS: There has been marked worsening in the patient's right pleural effusion and airspace opacity in the right mid and lower lung zones consistent with progressive carcinoma. There is a new small left pleural effusion and mild basilar atelectasis. Destructive change of the sixth and seventh ribs consistent with tumor invasion is identified as seen on prior CT scan.  IMPRESSION: Marked worsening of carcinoma and associated right effusion in the right chest.  New small left pleural effusion and basilar atelectasis.   Electronically Signed   By: Inge Rise M.D.   On: 06/19/2014 21:36    EKG: Independently reviewed. Normal sinus rhythm with low voltage EKG.  Assessment/Plan Principal Problem:   SOB (shortness of breath) Active Problems:   Malignant tumor, spindle cell type   Hypothyroidism   Hypoxia   Bilateral pleural effusion   1. Shortness of breath - probably multifactorial including worsening pleural effusion and patient's known cancer. Patient may also have a competent of CHF. At this time patient did receive 1 dose of Lasix 20 mg IV. I have ordered CT and exam of the chest to rule out PE and also to further assess patient's known cancer and also pleural effusion. Check 2-D echo and further recommendation based on patient's response to Lasix and findings on CT chest  and 2-D echo. Closely follow intake and output metabolic panel and daily weights. 2. Lung cancer on immunotherapy per oncology. 3. Hypothyroidism - continue Synthroid. 4. Chronic leukocytosis - follow CBC.   DVT Prophylaxis Lovenox.  Code Status: Full code.  Family Communication: None.  Disposition Plan: Admit to inpatient.    Braian Tijerina N. [facility] Pager [phone].  If 7PM-7AM, please contact night-coverage [email or web address] Password [email or web address] 06/20/2014, 1:11 AM

## 2014-06-20 NOTE — ED Notes (Signed)
Dr. Hal Hope at bedside.

## 2014-06-20 NOTE — Progress Notes (Signed)
Rx Brief note: Lovenox  Wt=42 kg, CrCl~35 ml/min  Rx adjusted Lovenox to 30mg  daily in pt with wt<45 kg.  [name] Dorrene German 06/20/2014 1:34 AM

## 2014-06-20 NOTE — Progress Notes (Signed)
PROGRESS NOTE  [name] RXV:[id number] DOB: 06-Mar-1931 DOA: 06/19/2014 PCP: Wenda Low, MD  HPI: 81 y.o. 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 Patient stated that she has been getting short of breath with increasing lower extremity edema over the last 2 weeks.  Subjective / 24 H Interval events - not dyspneic at rest but cannot walk much recently  Assessment/Plan: Principal Problem:   SOB (shortness of breath) Active Problems:   Malignant tumor, spindle cell type   Hypothyroidism   Hypoxia   Bilateral pleural effusion   Shortness of breath - probably multifactorial including worsening pleural effusion and patient's known cancer. Patient may also have a competent of CHF.  - CTPA pending, we should be able to further evaluate lung cancer and probable extension as per CXR. - PICC line to be placed, unable to draw labs or get contrast for CT - 2D echo pending - PT eval - strict I&O, daily weights  Lung cancer on immunotherapy per oncology.  Hypothyroidism - continue Synthroid.  Chronic leukocytosis - follow CBC.   Diet: Diet Heart Fluids: none DVT Prophylaxis: Lovenox  Code Status: Full Code Family Communication: none bedside  Disposition Plan: inpatient  Consultants:  None   Procedures:  None    Antibiotics  Anti-infectives    None      Studies  Dg Chest 2 View  06/19/2014   CLINICAL DATA:  Weakness and shortness of breath for 3 weeks, worse in tonight. 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  EXAM: CHEST  2 VIEW  COMPARISON:  CT chest 03/29/2014 and single view of the chest 11/01/2012.  FINDINGS: There has been marked worsening in the patient's right pleural effusion and airspace opacity in the right mid and lower lung zones consistent with progressive carcinoma. There is a new small left pleural effusion and mild basilar  atelectasis. Destructive change of the sixth and seventh ribs consistent with tumor invasion is identified as seen on prior CT scan.  IMPRESSION: Marked worsening of carcinoma and associated right effusion in the right chest.  New small left pleural effusion and basilar atelectasis.   Electronically Signed   By: Inge Rise M.D.   On: 06/19/2014 21:36   Objective  Filed Vitals:   06/20/14 0008 06/20/14 0041 06/20/14 0120 06/20/14 0415  BP:  143/82 102/63 103/63  Pulse:  90 89 81  Temp:   98.6 F (37 C) 98.4 F (36.9 C)  TempSrc:   Oral Oral  Resp:  16 20 20   Height:   4\' 11"  (1.499 m)   Weight:   43 kg (94 lb 12.8 oz)   SpO2: 94% 98% 95% 93%    Intake/Output Summary (Last 24 hours) at 06/20/14 1148 Last data filed at 06/20/14 0500  Gross per 24 hour  Intake      0 ml  Output    450 ml  Net   -450 ml   Filed Weights   06/20/14 0120  Weight: 43 kg (94 lb 12.8 oz)    Exam:  General:  NAD  HEENT: no scleral icterus  Cardiovascular: RRR  Respiratory: decreased breath sounds at the bases, no wheezig  Abdomen: soft, non tender  MSK/Extremities: trace LE edema  Skin: no rashes  Neuro: non focal   Data Reviewed: Basic Metabolic Panel:  Recent Labs Lab 06/14/14 0822 06/19/14 2245  NA 146* 138  K 3.5  4.1  CL  --  106  CO2 25 25  GLUCOSE 91 66*  BUN 15.5 13  CREATININE 0.8 0.83  CALCIUM 9.8 10.1   Liver Function Tests:  Recent Labs Lab 06/14/14 0822 06/19/14 2245  AST 20 24  ALT 9 9  ALKPHOS 172* 130*  BILITOT 0.39 0.8  PROT 6.6 6.1  ALBUMIN 2.6* 2.6*   CBC:  Recent Labs Lab 06/14/14 0821 06/19/14 2245  WBC 15.6* 15.9*  NEUTROABS 12.8* 13.6*  HGB 11.5* 12.6  HCT 36.0 39.0  MCV 95.7 96.8  PLT 269 270   Cardiac Enzymes:  Recent Labs Lab 06/20/14 0205 06/20/14 0700  TROPONINI 0.07* 0.06*   BNP (last 3 results)  Recent Labs  06/19/14 2245  BNP 696.5*   Scheduled Meds: . calcium-vitamin D  1 tablet Oral QODAY  .  cholecalciferol  1,000 Units Oral Daily  . DULoxetine  30 mg Oral Daily  . enoxaparin (LOVENOX) injection  30 mg Subcutaneous QHS  . ferrous sulfate  325 mg Oral Daily  . levothyroxine  50 mcg Oral QAC breakfast  . omega-3 acid ethyl esters  1 g Oral Daily  . sodium chloride  3 mL Intravenous Q12H  . sodium chloride  3 mL Intravenous Q12H  . vitamin B-12  100 mcg Oral Daily   Continuous Infusions:   Marzetta Board, MD [facility] Pager [phone]. If 7 PM - 7 AM, please contact night-coverage at [email or web address], password [facility] 06/20/2014, 11:48 AM  LOS: 0 days

## 2014-06-20 NOTE — ED Notes (Signed)
Netta Cedars (neice) [phone]

## 2014-06-21 ENCOUNTER — Inpatient Hospital Stay (HOSPITAL_COMMUNITY): Payer: Medicare Other

## 2014-06-21 DIAGNOSIS — R0902 Hypoxemia: Secondary | ICD-10-CM

## 2014-06-21 LAB — CBC
HCT: 32.4 % — ABNORMAL LOW (ref 36.0–46.0)
Hemoglobin: 10.3 g/dL — ABNORMAL LOW (ref 12.0–15.0)
MCH: 30.6 pg (ref 26.0–34.0)
MCHC: 31.8 g/dL (ref 30.0–36.0)
MCV: 96.1 fL (ref 78.0–100.0)
Platelets: 244 10*3/uL (ref 150–400)
RBC: 3.37 MIL/uL — AB (ref 3.87–5.11)
RDW: 20.6 % — ABNORMAL HIGH (ref 11.5–15.5)
WBC: 15.4 10*3/uL — ABNORMAL HIGH (ref 4.0–10.5)

## 2014-06-21 LAB — COMPREHENSIVE METABOLIC PANEL
ALT: 8 U/L (ref 0–35)
ANION GAP: 7 (ref 5–15)
AST: 18 U/L (ref 0–37)
Albumin: 2.3 g/dL — ABNORMAL LOW (ref 3.5–5.2)
Alkaline Phosphatase: 111 U/L (ref 39–117)
BILIRUBIN TOTAL: 0.3 mg/dL (ref 0.3–1.2)
BUN: 12 mg/dL (ref 6–23)
CHLORIDE: 105 mmol/L (ref 96–112)
CO2: 29 mmol/L (ref 19–32)
CREATININE: 0.85 mg/dL (ref 0.50–1.10)
Calcium: 9.9 mg/dL (ref 8.4–10.5)
GFR, EST AFRICAN AMERICAN: 72 mL/min — AB (ref >90)
GFR, EST NON AFRICAN AMERICAN: 63 mL/min — AB (ref >90)
GLUCOSE: 100 mg/dL — AB (ref 70–99)
Potassium: 3.5 mmol/L (ref 3.5–5.1)
Sodium: 141 mmol/L (ref 135–145)
Total Protein: 5.1 g/dL — ABNORMAL LOW (ref 6.0–8.3)

## 2014-06-21 LAB — BODY FLUID CELL COUNT WITH DIFFERENTIAL
Lymphs, Fluid: 39 %
Monocyte-Macrophage-Serous Fluid: 47 % — ABNORMAL LOW (ref 50–90)
NEUTROPHIL FLUID: 14 % (ref 0–25)
WBC FLUID: 240 uL (ref 0–1000)

## 2014-06-21 LAB — GLUCOSE, SEROUS FLUID: Glucose, Fluid: 117 mg/dL

## 2014-06-21 NOTE — Procedures (Signed)
US guided diagnostic/therapeutic left thoracentesis performed yielding 380 cc's slightly turbid, light yellow fluid. The fluid was sent to the lab for preordered studies. F/u CXR pending. No immediate complications. [place] post rt chest revealed only trace amt of free pleural fluid (RLL space occupied primarily by mass).

## 2014-06-21 NOTE — Evaluation (Signed)
Physical Therapy Evaluation Patient Details Name: [name] MRN: [id number] DOB: 24-Feb-1931 Today's Date: 06/21/2014   History of Present Illness  81 y.o. 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 CTA: negative for PE however moderately large bilateral pleural effusions  Clinical Impression  Pt admitted with above diagnosis. Pt currently with functional limitations due to the deficits listed below (see PT Problem List).  Pt will benefit from skilled PT to increase their independence and safety with mobility to allow discharge to the venue listed below.  Pt reports she lives alone however family/friends frequently there.  Pt states she is typically not very active however very limited ambulation distance today and required increased amount of oxygen.  Pt may benefit from ST-SNF prior to home if 24/7 assist is not available (pt fatigues quickly and has DOE, also requiring oxygen, reports previously not on oxygen at home).  SATURATION QUALIFICATIONS: (This note is used to comply with regulatory documentation for home oxygen)  Patient Saturations on Room Air at Rest = 95%  Patient Saturations on Room Air while Ambulating = 65%  Patient Saturations on 6 Liters of oxygen while Ambulating = 86-94%  Please briefly explain why patient needs home oxygen: to maintain oxygen saturations above 88% during functional activities such as ambulation.     Follow Up Recommendations Supervision/Assistance - 24 hour;SNF    Equipment Recommendations  Rolling walker with 5" wheels    Recommendations for Other Services       Precautions / Restrictions Precautions Precautions: Fall Precaution Comments: monitor sats      Mobility  Bed Mobility Overal bed mobility: Needs Assistance Bed Mobility: Supine to Sit     Supine to sit: Supervision     General bed mobility comments: for lines  Transfers Overall transfer level: Needs  assistance Equipment used: Rolling walker (2 wheeled) Transfers: Sit to/from Stand Sit to Stand: Min assist         General transfer comment: assist to steady and control descent  Ambulation/Gait Ambulation/Gait assistance: Min assist Ambulation Distance (Feet): 20 Feet (x2) Assistive device: Rolling walker (2 wheeled) Gait Pattern/deviations: Step-through pattern;Decreased stride length;Narrow base of support     General Gait Details: very slow pace, dyspnea 3/4, seated rest break required after 20 feet as well as applying [place] due to SpO2 dropping to 65% on room air and pursed lip breathing, after break pt felt able to ambulate back to room remaining on 6L O2 [facility]  Stairs            Wheelchair Mobility    Modified Rankin (Stroke Patients Only)       Balance                                             Pertinent Vitals/Pain Pain Assessment: No/denies pain    Home Living Family/patient expects to be discharged to:: Private residence Living Arrangements: Alone Available Help at Discharge: Friend(s);Available PRN/intermittently;Family Type of Home: House Home Access: Stairs to enter   [employer] of Steps: 2 Home Layout: One level Home Equipment: None      Prior Function Level of Independence: Independent               Hand Dominance        Extremity/Trunk Assessment  Lower Extremity Assessment: Generalized weakness         Communication   Communication: No difficulties  Cognition Arousal/Alertness: Awake/alert Behavior During Therapy: WFL for tasks assessed/performed Overall Cognitive Status: Within Functional Limits for tasks assessed                      General Comments      Exercises        Assessment/Plan    PT Assessment Patient needs continued PT services  PT Diagnosis Difficulty walking   PT Problem List Decreased strength;Decreased activity tolerance;Cardiopulmonary  status limiting activity;Decreased mobility;Decreased knowledge of use of DME  PT Treatment Interventions Gait training;DME instruction;Functional mobility training;Patient/family education;Therapeutic activities;Therapeutic exercise   PT Goals (Current goals can be found in the Care Plan section) Acute Rehab PT Goals PT Goal Formulation: With patient Time For Goal Achievement: 06/28/14 Potential to Achieve Goals: Good    Frequency Min 3X/week   Barriers to discharge        Co-evaluation               End of Session Equipment Utilized During Treatment: Gait belt;Oxygen Activity Tolerance: Patient limited by fatigue Patient left: in chair;with call bell/phone within reach;with chair alarm set Nurse Communication: Mobility status         Time: 5732-2025 PT Time Calculation (min) (ACUTE ONLY): 17 min   Charges:   PT Evaluation $Initial PT Evaluation Tier I: 1 Procedure     PT G Codes:        Tynell Winchell,KATHrine E 06/21/2014, 12:47 PM Carmelia Bake, PT, DPT 06/21/2014 Pager: [phone]

## 2014-06-21 NOTE — Progress Notes (Signed)
PROGRESS NOTE  [name] KXF:[id number] DOB: Nov 23, 1930 DOA: 06/19/2014 PCP: Wenda Low, MD  HPI: 81 y.o. 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 Patient stated that she has been getting short of breath with increasing lower extremity edema over the last 2 weeks.  Subjective / 24 H Interval events - feels weak  Assessment/Plan: Principal Problem:   SOB (shortness of breath) Active Problems:   Malignant tumor, spindle cell type   Hypothyroidism   Hypoxia   Bilateral pleural effusion   Acute hypoxic respiratory failure / Shortness of breath  - probably multifactorial including worsening pleural effusion and patient's known cancer. Patient may also have a competent of CHF.  - CTPA without evidence of PE however shows worsening of her tumor, have discussed with her primary oncologist Dr. Julien Nordmann, will consult palliative today - 2D echo EF 55-60%, no WMA, grade 1 diastolic dysfunctio - PT eval - strict I&O, daily weights - CT with large bilateral pleural effusions more on right, US thoracentesis ordered to help with her breathing.   Lung cancer on immunotherapy per oncology, discussed with Dr. Julien Nordmann this morning  Hypothyroidism - continue Synthroid.  Chronic leukocytosis - follow CBC.   Diet: Diet Heart Fluids: none DVT Prophylaxis: Lovenox  Code Status: Full Code Family Communication: none bedside  Disposition Plan: inpatient  Consultants:  Palliative care   Procedures:  None    Antibiotics  Anti-infectives    None      Studies  Dg Chest 2 View  06/19/2014   CLINICAL DATA:  Weakness and shortness of breath for 3 weeks, worse in tonight. 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  EXAM: CHEST  2 VIEW  COMPARISON:  CT chest 03/29/2014 and single view of the chest 11/01/2012.  FINDINGS: There has been marked worsening in the patient's  right pleural effusion and airspace opacity in the right mid and lower lung zones consistent with progressive carcinoma. There is a new small left pleural effusion and mild basilar atelectasis. Destructive change of the sixth and seventh ribs consistent with tumor invasion is identified as seen on prior CT scan.  IMPRESSION: Marked worsening of carcinoma and associated right effusion in the right chest.  New small left pleural effusion and basilar atelectasis.   Electronically Signed   By: Inge Rise M.D.   On: 06/19/2014 21:36   Ct Angio Chest Pe W/cm &/or Wo Cm  06/20/2014   CLINICAL DATA:  History of spindle cell lung carcinoma; increased shortness of breath.  EXAM: CT ANGIOGRAPHY CHEST WITH CONTRAST  TECHNIQUE: Multidetector CT imaging of the chest was performed using the standard protocol during bolus administration of intravenous contrast. Multiplanar CT image reconstructions and MIPs were obtained to evaluate the vascular anatomy.  CONTRAST:  100 mL OMNIPAQUE IOHEXOL 350 MG/ML SOLN  COMPARISON:  CT chest, abdomen and pelvis 03/29/2014.  FINDINGS: No pulmonary embolus is identified. Since the prior study, the patient has developed moderate to moderately large bilateral pleural effusions, greater on the right. No pericardial effusion is identified. As on the prior study, the esophagus is dilated and fluid-filled.  Pleural based mass in the right chest is difficult to discretely visualized on today's examination but is estimated to measure 9.8 cm AP x 4.1 cm transverse on image 59 compared to 8.7 cm AP x 3.6 cm transverse on the prior examination. There appear to be new pleural masses on the  right including one in the lower right chest on image 73 measuring 2.6 cm AP x 2.0 cm transverse.  Lungs demonstrate extensive centrilobular emphysematous disease. There is compressive atelectasis bilaterally, much worse on the right.  Visualized upper abdomen demonstrates a small volume of perisplenic ascites.  Postoperative change along the stomach is noted.  Remote right rib fractures are identified. There appears to be tumor invasion in the right fourth through eighth ribs, worsened since the prior study.  Review of the MIP images confirms the above findings.  IMPRESSION: Negative for pulmonary embolus.  Marked progression of carcinoma in the right chest with an enlarged mass in the right lung base and a new pleural-based mass identified on the right. Invasion of the chest wall with destructive change in right ribs also appears progressive.  New moderate to moderately large bilateral pleural effusions. New small volume of perisplenic ascites is also identified.  Severe emphysema.   Electronically Signed   By: Inge Rise M.D.   On: 06/20/2014 21:21   Objective  Filed Vitals:   06/20/14 0415 06/20/14 1455 06/20/14 2229 06/21/14 0622  BP: 103/63 108/64 126/66 132/67  Pulse: 81 85 80 86  Temp: 98.4 F (36.9 C) 98.5 F (36.9 C) 97.8 F (36.6 C) 97.8 F (36.6 C)  TempSrc: Oral Oral Oral Oral  Resp: 20 20 20 18   Height:      Weight:    41.3 kg (91 lb 0.8 oz)  SpO2: 93% 99% 97% 98%    Intake/Output Summary (Last 24 hours) at 06/21/14 1129 Last data filed at 06/21/14 0700  Gross per 24 hour  Intake    483 ml  Output   1450 ml  Net   -967 ml   Filed Weights   06/20/14 0120 06/21/14 0622  Weight: 43 kg (94 lb 12.8 oz) 41.3 kg (91 lb 0.8 oz)    Exam:  General:  NAD  HEENT: no scleral icterus  Cardiovascular: RRR  Respiratory: decreased breath sounds at the bases, no wheezig  Abdomen: soft, non tender  MSK/Extremities: trace LE edema  Skin: no rashes  Neuro: non focal   Data Reviewed: Basic Metabolic Panel:  Recent Labs Lab 06/19/14 2245 06/21/14 0526  NA 138 141  K 4.1 3.5  CL 106 105  CO2 25 29  GLUCOSE 66* 100*  BUN 13 12  CREATININE 0.83 0.85  CALCIUM 10.1 9.9   Liver Function Tests:  Recent Labs Lab 06/19/14 2245 06/21/14 0526  AST 24 18  ALT 9 8    ALKPHOS 130* 111  BILITOT 0.8 0.3  PROT 6.1 5.1*  ALBUMIN 2.6* 2.3*   CBC:  Recent Labs Lab 06/19/14 2245 06/21/14 0526  WBC 15.9* 15.4*  NEUTROABS 13.6*  --   HGB 12.6 10.3*  HCT 39.0 32.4*  MCV 96.8 96.1  PLT 270 244   Cardiac Enzymes:  Recent Labs Lab 06/20/14 0205 06/20/14 0700  TROPONINI 0.07* 0.06*   BNP (last 3 results)  Recent Labs  06/19/14 2245  BNP 696.5*   Scheduled Meds: . calcium-vitamin D  1 tablet Oral QODAY  . cholecalciferol  1,000 Units Oral Daily  . DULoxetine  30 mg Oral Daily  . enoxaparin (LOVENOX) injection  30 mg Subcutaneous QHS  . ferrous sulfate  325 mg Oral Daily  . levothyroxine  50 mcg Oral QAC breakfast  . omega-3 acid ethyl esters  1 g Oral Daily  . sodium chloride  3 mL Intravenous Q12H  . sodium chloride  3  mL Intravenous Q12H  . vitamin B-12  100 mcg Oral Daily   Continuous Infusions:   Marzetta Board, MD [facility] Pager [phone]. If 7 PM - 7 AM, please contact night-coverage at [email or web address], password [facility] 06/21/2014, 11:29 AM  LOS: 1 day

## 2014-06-21 NOTE — Care Management Note (Signed)
CARE MANAGEMENT NOTE 06/21/2014  Patient:  JOLEENE, BURNHAM   Account Number:  [id number]  Date Initiated:  06/21/2014  Documentation initiated by:  Marney Doctor  Subjective/Objective Assessment:   81 yo admitted with SOB. 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   (If response is "NO", the following Medicare IM given date fields will be blank) Date Medicare IM given:   Medicare IM given by:   Date Additional Medicare IM given:   Additional Medicare IM given by:    Discharge Disposition:    Per UR Regulation:  Reviewed for med. necessity/level of care/duration of stay  If discussed at [place] of Stay Meetings, dates discussed:    Comments:  06/21/14 Marney Doctor RN,BSN,NCM [phone] Chart reviewed. PT evaluation ordered to assist with DC planning.  CM will continue to follow.

## 2014-06-22 DIAGNOSIS — R0600 Dyspnea, unspecified: Secondary | ICD-10-CM

## 2014-06-22 DIAGNOSIS — E43 Unspecified severe protein-calorie malnutrition: Secondary | ICD-10-CM | POA: Diagnosis present

## 2014-06-22 DIAGNOSIS — Z515 Encounter for palliative care: Secondary | ICD-10-CM

## 2014-06-22 DIAGNOSIS — Z66 Do not resuscitate: Secondary | ICD-10-CM | POA: Diagnosis present

## 2014-06-22 DIAGNOSIS — J9601 Acute respiratory failure with hypoxia: Secondary | ICD-10-CM

## 2014-06-22 DIAGNOSIS — R531 Weakness: Secondary | ICD-10-CM

## 2014-06-22 LAB — PH, BODY FLUID: pH, Fluid: 8

## 2014-06-22 MED ORDER — BISACODYL 10 MG RE SUPP: 10.0000 mg | Freq: Every day | RECTAL | Status: DC | PRN

## 2014-06-22 MED ORDER — MORPHINE SULFATE (CONCENTRATE) 10 MG/0.5ML PO SOLN: 5.0000 mg | ORAL | Status: DC | PRN

## 2014-06-22 MED ORDER — LORAZEPAM 0.5 MG PO TABS: 0.5000 mg | ORAL_TABLET | Freq: Four times a day (QID) | ORAL | Status: DC | PRN

## 2014-06-22 NOTE — Progress Notes (Addendum)
Follow-up Writer further discussed pt with Wadie Lessen NP PMT, information reviewed with Dr [facility] Medical Director, Dr Julien Nordmann oncologist was also contacted and he informed he palns to speak to the pt later though does not feel additional chemotherapy will be offered. Writer spoke with pt's niece Parks Ranger [phone]); niece stated that her aunt and the family are agreeable to [facility] services; niece confirmed they are aware her aunt may not be able to tolerate any further chemo even if it was offered, she felt that her aunt only stated she wanted chemo because 'she wanted to complete what was being offered' - Hinton Dyer stated we are aware her cancer has become more aggressive and progressed; we want to do what we can to make her comfortable; I plan to be with her mostly, I am not working right now. *Per discussion pt to d/c by PTAR -Please send completed GOLD DNR form home with pt * currently pt has comfort medications ordered by PMT:  Concentrated liquid Morphine 10 mg/0.93ml  And Ativan 0.5 mg - pt will need prescriptions for these medications at discharge  ** for liquid Concentrated Morphine -* Please note Dispense Quantity =30 ML DME needs discussed pt does not want a hospital bed- at this time requests:  1) a  4 prong cane; and 2) O2 pkg B  with O2 concentrator, E tanks: pt currently on O2 @ 2L [place] continuous - pt also has PRN Neb tx ordered  [facility] aware pt to discharge tomorrow; per discussion with niece [facility] to co-ordinate with her for timing of admission nurse visit - Hinton Dyer stated she is agreeable to AV RN on Saturday if necessary Please notify [facility] when PTAR on unit and pt ready to leave contact [phone] (or [phone] aftr 5 pm) Please call with any questions Danton Sewer, RN MSN [facility] Liaison [phone]

## 2014-06-22 NOTE — Progress Notes (Signed)
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 Patient found to be in acute hypoxic respiratory failure with CT angiogram showing marked progression of right chest cancer with enlarged mass in the right lung base with invasion of the chest wall and new bilateral pleural effusion.  Assessment/Plan: Acute hypoxic respiratory failure -Likely in the setting of worsening pleural effusion with underlying malignancy. CT angiogram negative for PE. Ultrasound-guided thoracentesis done on 2/17 with 380 cc turbid light yellow fluid removed and sent to the lab. -Dr. Cruzita Lederer discussed with patient's oncologist Dr. Julien Nordmann and given worsening of her tumor consulted palliative care team for goals of care discussion. -Continue O2 via nasal cannula. -2-D echo with EF of 55-60% and no word motion abnormality, grade 1 diastolic dysfunction. -Monitor strict I/O and daily weight -Symptoms have improved with ultrasound-guided thoracentesis. -PT eval  Spindle cell carcinoma of the lung On immunotherapy per oncology. Not responding to treatment with increasing size of the lung mass with chest wall invasion. Palliative care consulted by prior hospitalist after discussing with oncologist.  Hypothyroidism Continue Synthroid  Chronic leukocytosis  Severe protein calorie malnutrition Will request nutrition consult.  Code Status: DO NOT RESUSCITATE Family Communication: Niece at bedside Disposition Plan: Pending discussions with palliative care    Consultants:   IR  Palliative care  Procedures:  Right-sided therapeutic thoracentesis on 2/17  Antibiotics:  None  HPI/Subjective: Patient seen and examined. Feels her breathing  to be better after thoracentesis yesterday. Denies any chest pain  Objective: Filed Vitals:   06/22/14 0521  BP: 117/66  Pulse: 86  Temp: 97.7 F (36.5 C)  Resp: 18    Intake/Output Summary (Last 24 hours) at 06/22/14 1309 Last data filed at 06/22/14 0931  Gross per 24 hour  Intake    483 ml  Output      0 ml  Net    483 ml   Filed Weights   06/20/14 0120 06/21/14 0622 06/22/14 0521  Weight: 43 kg (94 lb 12.8 oz) 41.3 kg (91 lb 0.8 oz) 43.001 kg (94 lb 12.8 oz)    Exam:   General:  Elderly cachectic female in no acute distress  HEENT: No pallor, moist oral mucosa, temporal wasting, supple neck  Chest: Mildly diminished breath sounds over right lung base with fine crackles, no rhonchi or wheeze  Cardiovascular: Normal S1 and S2, no murmurs rub or gallop  GI: Soft, nondistended, nontender, bowel sounds present  Musculoskeletal, warm, no edema  CNS: Alert and oriented   Data Reviewed: Basic Metabolic Panel:  Recent Labs Lab 06/19/14 2245 06/21/14 0526  NA 138 141  K 4.1 3.5  CL 106 105  CO2 25 29  GLUCOSE 66* 100*  BUN 13 12  CREATININE 0.83 0.85  CALCIUM 10.1 9.9   Liver Function Tests:  Recent Labs Lab 06/19/14 2245 06/21/14 0526  AST 24 18  ALT 9 8  ALKPHOS 130* 111  BILITOT 0.8 0.3  PROT 6.1 5.1*  ALBUMIN 2.6* 2.3*   No results for input(s): LIPASE, AMYLASE in the last 168 hours. No results for input(s): AMMONIA in the last 168 hours. CBC:  Recent Labs Lab 06/19/14 2245 06/21/14 0526  WBC 15.9* 15.4*  NEUTROABS 13.6*  --   HGB 12.6 10.3*  HCT 39.0 32.4*  MCV 96.8 96.1  PLT 270 244   Cardiac Enzymes:  Recent Labs Lab 06/20/14 0205 06/20/14 0700  TROPONINI 0.07* 0.06*   BNP (last 3 results)  Recent Labs  06/19/14 2245  BNP 696.5*    ProBNP (last 3 results) No results for input(s): PROBNP in the last 8760 hours.  CBG: No results for input(s): GLUCAP in the last 168 hours.  Recent Results (from the past 240  hour(s))  Urine culture     Status: None   Collection Time: 06/19/14  9:52 PM  Result Value Ref Range Status   Specimen Description URINE, CLEAN CATCH  Final   Special Requests NONE  Final   Colony Count   Final    10,000 COLONIES/ML Performed at [facility]    Culture   Final    Multiple bacterial morphotypes present, none predominant. Suggest appropriate recollection if clinically indicated. Performed at [facility]    Report Status 06/20/2014 FINAL  Final  Body fluid culture     Status: None (Preliminary result)   Collection Time: 06/21/14  3:48 PM  Result Value Ref Range Status   Specimen Description THORACENTESIS  Final   Special Requests NONE  Final   Gram Stain   Final    RARE WBC PRESENT,BOTH PMN AND MONONUCLEAR NO ORGANISMS SEEN Performed at [facility]    Culture NO GROWTH Performed at [facility]   Final   Report Status PENDING  Incomplete     Studies: Dg Chest 1 View  06/21/2014   CLINICAL DATA:  Post thoracentesis  EXAM: CHEST  1 VIEW  COMPARISON:  06/19/2014  FINDINGS: Right greater than left pleural effusions, similar to prior. There is opacification of the underlying right lung, which is known to contain a large mass that erodes into the right chest wall. There is stable aortic and hilar contours post thoracentesis. No evidence of air leak or re-expansion edema. There is chronic asymmetric pleural thickening at the right apex, neighboring lung sutures.  IMPRESSION: No indication of thoracentesis complication.   Electronically Signed   By: Monte Fantasia M.D.   On: 06/21/2014 16:13   Ct Angio Chest Pe W/cm &/or Wo Cm  06/20/2014   CLINICAL DATA:  History of spindle cell lung carcinoma; increased shortness of breath.  EXAM: CT ANGIOGRAPHY CHEST WITH CONTRAST  TECHNIQUE: Multidetector CT imaging of the chest was performed using the standard protocol during bolus administration of intravenous contrast. Multiplanar CT image  reconstructions and MIPs were obtained to evaluate the vascular anatomy.  CONTRAST:  100 mL OMNIPAQUE IOHEXOL 350 MG/ML SOLN  COMPARISON:  CT chest, abdomen and pelvis 03/29/2014.  FINDINGS: No pulmonary embolus is identified. Since the prior study, the patient has developed moderate to moderately large bilateral pleural effusions, greater on the right. No pericardial effusion is identified. As on the prior study, the esophagus is dilated and fluid-filled.  Pleural based mass in the right chest is difficult to discretely visualized on today's examination but is estimated to measure 9.8 cm AP x 4.1 cm transverse on image 59 compared to 8.7 cm AP x 3.6 cm transverse on the prior examination. There appear to be new pleural masses on the right including one in the lower right chest on image 73 measuring 2.6 cm AP x 2.0 cm transverse.  Lungs demonstrate extensive centrilobular emphysematous disease. There is compressive atelectasis bilaterally, much worse on the right.  Visualized upper abdomen demonstrates a small volume of perisplenic ascites. Postoperative change along  the stomach is noted.  Remote right rib fractures are identified. There appears to be tumor invasion in the right fourth through eighth ribs, worsened since the prior study.  Review of the MIP images confirms the above findings.  IMPRESSION: Negative for pulmonary embolus.  Marked progression of carcinoma in the right chest with an enlarged mass in the right lung base and a new pleural-based mass identified on the right. Invasion of the chest wall with destructive change in right ribs also appears progressive.  New moderate to moderately large bilateral pleural effusions. New small volume of perisplenic ascites is also identified.  Severe emphysema.   Electronically Signed   By: Inge Rise M.D.   On: 06/20/2014 21:21   US Thoracentesis Asp Pleural Space W/img Guide  06/21/2014   INDICATION: Spindle cell cancer of the lung, dyspnea, pleural  effusions. Request is made for diagnostic and therapeutic thoracentesis.  EXAM: ULTRASOUND GUIDED DIAGNOSTIC AND THERAPEUTIC LEFT THORACENTESIS  COMPARISON:  None.  MEDICATIONS: None  COMPLICATIONS: None immediate  TECHNIQUE: Informed written consent was obtained from the patient after a discussion of the risks, benefits and alternatives to treatment. A timeout was performed prior to the initiation of the procedure.  Initial ultrasound scanning demonstrates a small left pleural effusion. The lower chest was prepped and draped in the usual sterile fashion. 1% lidocaine was used for local anesthesia.  An ultrasound image was saved for documentation purposes. An 6 Fr Safe-T-Centesis catheter was introduced. The thoracentesis was performed. The catheter was removed and a dressing was applied. The patient tolerated the procedure well without immediate post procedural complication. The patient was escorted to have an upright chest radiograph.  FINDINGS: A total of approximately 380 cc's of slightly turbid, light yellow fluid was removed. Requested samples were sent to the laboratory. Limited ultrasound of posterior right chest today reveals only a trace amount of free pleural fluid. Right lower lobe space is occupied primarily by mass.  IMPRESSION: Successful ultrasound-guided diagnostic and therapeutic left sided thoracentesis yielding 380 cc's of pleural fluid.  Read by: Rowe Robert, PA-C   Electronically Signed   By: Corrie Mckusick D.O.   On: 06/21/2014 17:01    Scheduled Meds: . calcium-vitamin D  1 tablet Oral QODAY  . cholecalciferol  1,000 Units Oral Daily  . DULoxetine  30 mg Oral Daily  . enoxaparin (LOVENOX) injection  30 mg Subcutaneous QHS  . ferrous sulfate  325 mg Oral Daily  . levothyroxine  50 mcg Oral QAC breakfast  . omega-3 acid ethyl esters  1 g Oral Daily  . sodium chloride  3 mL Intravenous Q12H  . sodium chloride  3 mL Intravenous Q12H  . vitamin B-12  100 mcg Oral Daily   Continuous  Infusions:      Time spent: 25 minutes    Alma Muegge  [facility] Pager [phone] If 7PM-7AM, please contact night-coverage at [email or web address], password [facility] 06/22/2014, 1:09 PM  LOS: 2 days

## 2014-06-22 NOTE — Consult Note (Signed)
Patient [name]      DOB: 1931/03/24      PJK:[id number]     Consult Note from the Palliative Medicine Team at [facility] Requested by: Dhungel     PCP: Wenda Low, MD Reason for Consultation:Clarification of [place] and options     Phone [phone]  Assessment of patients Current state:   Continued physical and functional decline 2/2 marked progression of carcinoma in the right chest with an enlarged mass in the right lung base and a new pleural-based mass identified on the right.  Invasion of the chest wall with destructive change in right ribs noted. Patient faced with advanced directive decisions and anticipatory care needs  Consult is for review of medical treatment options, clarification of goals of care and end of life issues, disposition and options, and symptom recommendation.  This NP Wadie Lessen reviewed medical records, received report from team, assessed the patient and then meet at the patient's bedside along with her niece Netta Cedars and Parks Ranger   to discuss diagnosis, prognosis, GOC, EOL wishes disposition and options.  A detailed discussion was had today regarding advanced directives.  Concepts specific to code status, artifical feeding and hydration, continued IV antibiotics and rehospitalization was had.  The difference between a aggressive medical intervention path  and a palliative comfort care path for this patient at this time was had.  Values and goals of care important to patient and family were attempted to be elicited.  Patient may decide to f/u with Dr Earlie Server as OP for closure regarding offered interventions.    Concept of Hospice and Palliative Care were discussed.  MOST form completed  Natural trajectory and expectations at EOL were discussed.  Questions and concerns addressed.   Family encouraged to call with questions or concerns.  PMT will continue to support holistically.   Goals of Care: 1.  Code Status: DNR./  DNI -comfort is priority of care  2. Scope of Treatment:  Patient is hopeful for discharge home and to focus on comfort, quality and dignity.  No further re-hospitalizations, diagnostics, artifical feeding or hydration now or in the future.  MOST form completed.   3. Disposition:  Home with hospice will write for choice   4. Symptom Management:   1. Anxiety/Agitation: Ativan 0.5 mg po every 6 hrs prn 2. Pain/Dyspnea: Morphine soln 5 mg po/sl every 4 hrs prn 3. Bowel Regimen:  Dulcolax supp prn  5. 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 Dyslipidemia   . Arthritis     Hx of osteoarthritis  . Shortness of breath     WITH EXERTION   . COPD (chronic obstructive pulmonary disease)   . Anemia   . History of chemotherapy     carboplatin/taxol  . Lung mass   . 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 History of radiation therapy 01/13/11-02/20/11    abdomen     PSH: Past  Surgical History  Procedure Laterality Date  . Esophagogastrectomy, j tube 11/08/2010    . Cholecystectomy    . Abdominal hysterectomy    . Cholecystectomy    . Cataract extraction      RIGHT EYE   . Port-a-cath removal  05/13/2011    Procedure: REMOVAL PORT-A-CATH;  Surgeon: Stark Klein, MD;  Location: [facility];  Service: General;  Laterality: N/A;  Removal port-a-cath.  . Portacath placement    . Eye surgery    . Bronchoscopy  08/28/11    rt upper lung mass bx  . Cardia resected  11/2010  . Wedge resection  09/18/11    right vats Dr. Lanelle Bal   I have reviewed the [facility] and  If appropriate update it with new information. Allergies  Allergen Reactions  . Atarax [Hydroxyzine] Rash    States it made her break out on her lower legs.   Scheduled Meds: . calcium-vitamin D  1 tablet Oral QODAY  . cholecalciferol  1,000 Units Oral Daily  . DULoxetine  30 mg Oral Daily  . enoxaparin (LOVENOX) injection  30 mg Subcutaneous QHS  . ferrous sulfate  325 mg Oral Daily  . levothyroxine  50 mcg Oral QAC breakfast  . omega-3 acid ethyl esters  1 g Oral Daily  . sodium chloride  3 mL Intravenous Q12H  . sodium chloride  3 mL Intravenous Q12H  . vitamin B-12  100 mcg Oral Daily   Continuous Infusions:  PRN Meds:.acetaminophen **OR** acetaminophen, albuterol, HYDROcodone-acetaminophen, ondansetron **OR** ondansetron (ZOFRAN) IV, prochlorperazine, zolpidem    BP 117/66 mmHg  Pulse 86  Temp(Src) 97.7 F (36.5 C) (Oral)  Resp 18  Ht 4\' 11"  (1.499 m)  Wt 43.001 kg (94 lb 12.8 oz)  BMI 19.14 kg/m2  SpO2 93%   PPS:30 % at best  ECOG PERFORMANCE STATUS* (Eastern Cooperative Oncology Group)  0 Fully active, able to continue with all pre-disease activities without restriction. Pt score  1 Restricted in physically strenuous activity but ambulatory and able to carry out work of a light or sedentary nature, e.g., light house work, office work.   2 Ambulatory and capable of all self-care but unable to carry out any work activities. Up and about more than 50% of waking hours.    3 Capable of only limited self-care. Confined to bed or chair more than 50% of waking hours.   4 Completely disabled. Cannot carry on any self-care. Totally confined to bed or chair. 4  5 Dead.    As published in Am. J. Clin. Oncol.: Eustace Pen, M.M., Colon Flattery., [place], D.C., [place], Sharen Hint., Drexel Iha, P.P.: Toxicity And Response Criteria Of The [facility]. [place] 0:737-106, 1982.  The ECOG Performance  Status is in the public domain therefore available for public use. To duplicate the scale, please cite the reference above and credit the [facility], Tyler Pita M.D., Group Chair    Intake/Output Summary (Last 24 hours) at 06/22/14 1106 Last data filed at 06/22/14 0931  Gross per 24 hour  Intake    603 ml  Output    150 ml  Net    453 ml    Physical Exam:  General: chronically ill appearing HEENT:  Moist buccal membranes, no exudate Chest:   Decreased in bases CVS:  RRR  Labs: CBC    Component Value Date/Time   WBC 15.4* 06/21/2014 0526   WBC 15.6* 06/14/2014 2694  RBC 3.37* 06/21/2014 0526   RBC 3.76 06/14/2014 0821   RBC 3.01* 11/21/2010 0446   HGB 10.3* 06/21/2014 0526   HGB 11.5* 06/14/2014 0821   HCT 32.4* 06/21/2014 0526   HCT 36.0 06/14/2014 0821   PLT 244 06/21/2014 0526   PLT 269 06/14/2014 0821   MCV 96.1 06/21/2014 0526   MCV 95.7 06/14/2014 0821   MCH 30.6 06/21/2014 0526   MCH 30.6 06/14/2014 0821   MCHC 31.8 06/21/2014 0526   MCHC 31.9 06/14/2014 0821   RDW 20.6* 06/21/2014 0526   RDW 21.2* 06/14/2014 0821   LYMPHSABS 1.1 06/19/2014 2245   LYMPHSABS 1.6 06/14/2014 0821   MONOABS 1.1* 06/19/2014 2245   MONOABS 1.1* 06/14/2014 0821   EOSABS 0.0 06/19/2014 2245   EOSABS 0.1 06/14/2014 0821   BASOSABS 0.0 06/19/2014 2245   BASOSABS 0.0 06/14/2014 0821    BMET    Component Value Date/Time   NA 141 06/21/2014 0526   NA 146* 06/14/2014 0822   NA 143 07/23/2011 0916   K 3.5 06/21/2014 0526   K 3.5 06/14/2014 0822   K 3.8 07/23/2011 0916   CL 105 06/21/2014 0526   CL 104 07/23/2011 0916   CO2 29 06/21/2014 0526   CO2 25 06/14/2014 0822   CO2 23 07/23/2011 0916   GLUCOSE 100* 06/21/2014 0526   GLUCOSE 91 06/14/2014 0822   GLUCOSE 83 07/23/2011 0916   BUN 12 06/21/2014 0526   BUN 15.5 06/14/2014 0822   BUN 7 07/23/2011 0916   CREATININE 0.85 06/21/2014 0526   CREATININE 0.8 06/14/2014 0822   CREATININE 0.8  07/23/2011 0916   CALCIUM 9.9 06/21/2014 0526   CALCIUM 9.8 06/14/2014 0822   CALCIUM 8.9 07/23/2011 0916   GFRNONAA 63* 06/21/2014 0526   GFRAA 72* 06/21/2014 0526    CMP     Component Value Date/Time   NA 141 06/21/2014 0526   NA 146* 06/14/2014 0822   NA 143 07/23/2011 0916   K 3.5 06/21/2014 0526   K 3.5 06/14/2014 0822   K 3.8 07/23/2011 0916   CL 105 06/21/2014 0526   CL 104 07/23/2011 0916   CO2 29 06/21/2014 0526   CO2 25 06/14/2014 0822   CO2 23 07/23/2011 0916   GLUCOSE 100* 06/21/2014 0526   GLUCOSE 91 06/14/2014 0822   GLUCOSE 83 07/23/2011 0916   BUN 12 06/21/2014 0526   BUN 15.5 06/14/2014 0822   BUN 7 07/23/2011 0916   CREATININE 0.85 06/21/2014 0526   CREATININE 0.8 06/14/2014 0822   CREATININE 0.8 07/23/2011 0916   CALCIUM 9.9 06/21/2014 0526   CALCIUM 9.8 06/14/2014 0822   CALCIUM 8.9 07/23/2011 0916   PROT 5.1* 06/21/2014 0526   PROT 6.6 06/14/2014 0822   PROT 7.1 07/23/2011 0916   ALBUMIN 2.3* 06/21/2014 0526   ALBUMIN 2.6* 06/14/2014 0822   AST 18 06/21/2014 0526   AST 20 06/14/2014 0822   AST 31 07/23/2011 0916   ALT 8 06/21/2014 0526   ALT 9 06/14/2014 0822   ALT 22 07/23/2011 0916   ALKPHOS 111 06/21/2014 0526   ALKPHOS 172* 06/14/2014 0822   ALKPHOS 109* 07/23/2011 0916   BILITOT 0.3 06/21/2014 0526   BILITOT 0.39 06/14/2014 0822   BILITOT 0.60 07/23/2011 0916   GFRNONAA 63* 06/21/2014 0526   GFRAA 72* 06/21/2014 0526    Time In Time Out Total Time Spent with Patient Total Overall Time  1200 1315 70 min 75 min    Greater than 50%  of this time was spent counseling and coordinating care related to the above assessment and plan.   Wadie Lessen NP  Palliative Medicine Team Team Phone # [phone] Pager [phone]  Discussed with  CMRN

## 2014-06-22 NOTE — Progress Notes (Addendum)
Received referral from [facility] for hospice at home after discharge. Writer spoke with patient at bedside, pt was A&O x 3 able to tell me she was in the hospital because she was so short of breath at home. Per pt, her niece had recently "left the hospital after the meeting with the other hospice lady" (PMT NP Wadie Lessen); pt informed writer that her understanding was she 'needed more help at home'; her nieces were trying to arrange to stay with her during the nighttime;  Ms Viti stated at this time she wants to continue chemotherapy treatments. Writer discussed with Ms Newcomer, [facility] services and hospice care at home is started at the point when patients have decided to stop chemotherapy with the focus on comfort at home- Ms Prospero state "I still want chemotherapy" - possibility of home health options for home discussed and Ms Milliron informed Probation officer would let Juliann Pulse [facility] aware of her questions; Ms Lute indicated this Probation officer could also speak with her niece; Probation officer spoke with Juliann Pulse [facility] to inform of above; Juliann Pulse [employer] stated she will follow up with pt's niece and requested writer contact PMT Wadie Lessen NP; writer left voice message for PMT NP related to above- At this time there appears to still be a need for clarification regarding disposition; pt will not be eligible for hospice if she is receiving chemotherapy.  Please contact if decisions change or if pt/family would like to further discuss hospice services . Danton Sewer, RN MSN [facility] Liaison [phone]

## 2014-06-22 NOTE — Progress Notes (Signed)
Pt resting in bed, no void yet during day shift, no urge or discomfort. Bladder scan reveals 252ml in bladder, Dr Clementeen Graham paged with results, states to recheck bladder scan at 8pm. Pt resting in bed, no pain, no distress, talking on phone. Assessment unchanged from AM assessment. Bed alarm on, callbell in reach.

## 2014-06-22 NOTE — Care Management Note (Addendum)
    Page 1 of 2   06/23/2014     2:20:37 PM CARE MANAGEMENT NOTE 06/23/2014  Patient:  [name], [name]   Account Number:  [id number]  Date Initiated:  06/21/2014  Documentation initiated by:  Marney Doctor  Subjective/Objective Assessment:   81 yo admitted with SOB. Hx of spindle cell cancer of the lung on immunotherapy     Action/Plan:   From home with children   Anticipated DC Date:  06/23/2014   Anticipated DC Plan:  [place]  CM consult      Choice offered to / List presented to:  C-1 Patient        [place] arranged  HH-1 RN      [place]   Status of service:  Completed, signed off Medicare Important Message given?  YES (If response is "NO", the following Medicare IM given date fields will be blank) Date Medicare IM given:  06/22/2014 Medicare IM given by:  [facility] Date Additional Medicare IM given:   Additional Medicare IM given by:    Discharge Disposition:  [place]  Per UR Regulation:  Reviewed for med. necessity/level of care/duration of stay  If discussed at [place] of Stay Meetings, dates discussed:    Comments:  06/23/14 Dessa Phi RN BSN NCM [address] has accepted case, & will follow for dme, & home hospice services.Ambulance transp-CSW managing.  06/22/14 Dessa Phi RN BSN NCM [phone] received call from [place] recommended she contact palliative & MD for clarification of disposition.Will await final disposition from MD.  Received referral for home hospice choice.Spoke to patient/w family-[name](great great neice tel#[phone]) chose [facility], Dr. Julien Nordmann to [facility] has rw,3n1,shower chair.patient wants 4 prong cane.Will need home 02,currently on 02 4l [place].Patient not interested in hospital bed at this time.Will need ambulance transp @ d/c.CSW notified.patient also wanted to know about a ct scan scheduled for Friday @  Cancer center-Nurse notified. Await final home hospice order.  06/21/14 Marney Doctor RN,BSN,NCM [phone] Chart reviewed. PT evaluation ordered to assist with DC planning.  CM will continue to follow.

## 2014-06-22 NOTE — Progress Notes (Signed)
Report received from [place]. No change from initial pm report. Will continue to monitor and follow the POC.

## 2014-06-23 ENCOUNTER — Ambulatory Visit (HOSPITAL_COMMUNITY): Payer: Medicare Other

## 2014-06-23 ENCOUNTER — Telehealth: Payer: Self-pay | Admitting: *Deleted

## 2014-06-23 DIAGNOSIS — C3411 Malignant neoplasm of upper lobe, right bronchus or lung: Secondary | ICD-10-CM

## 2014-06-23 DIAGNOSIS — J910 Malignant pleural effusion: Secondary | ICD-10-CM | POA: Diagnosis present

## 2014-06-23 DIAGNOSIS — R531 Weakness: Secondary | ICD-10-CM

## 2014-06-23 DIAGNOSIS — C782 Secondary malignant neoplasm of pleura: Secondary | ICD-10-CM

## 2014-06-23 DIAGNOSIS — R079 Chest pain, unspecified: Secondary | ICD-10-CM

## 2014-06-23 MED ORDER — LORAZEPAM 0.5 MG PO TABS: 0.5000 mg | ORAL_TABLET | Freq: Four times a day (QID) | ORAL | Status: DC | PRN

## 2014-06-23 MED ORDER — BISACODYL 10 MG RE SUPP: 10.0000 mg | Freq: Every day | RECTAL | Status: AC | PRN

## 2014-06-23 MED ORDER — MORPHINE SULFATE (CONCENTRATE) 10 MG/0.5ML PO SOLN: 5.0000 mg | ORAL | Status: DC | PRN

## 2014-06-23 MED ORDER — IOHEXOL 300 MG/ML  SOLN: 25.0000 mL | INTRAMUSCULAR | Status: DC

## 2014-06-23 NOTE — Telephone Encounter (Signed)
Verbal order received and read back from Awilda Metro PA-C that Dr. Julien Nordmann will be the attending for this patient.

## 2014-06-23 NOTE — Telephone Encounter (Signed)
Evette RN with Hospice called reporting patient being discharged today. Asked if Dr.Mohamed will be the attending provider.  Dr.Mohamed off today.  Asked for any assistance with order as they wish to go see patient at 1430.  Will notify A.P.P.  Return call number [phone].

## 2014-06-23 NOTE — Discharge Summary (Signed)
Physician Discharge Summary  [name] EQA:[id number] DOB: 11-01-1930 DOA: 06/19/2014  PCP: Wenda Low, MD  Admit date: 06/19/2014 Discharge date: 06/23/2014  Time spent: 30 minutes  Recommendations for Outpatient Follow-up:  1. Discharge home with home hospice.   Discharge Diagnoses:  Principal Problem:   SOB (shortness of breath)  Active Problems:   Malignant tumor, spindle cell type   Hypothyroidism   Hypoxia with bilateral pleural effusion   Protein-calorie malnutrition, severe   DNR (do not resuscitate)   Palliative care encounter   Weakness generalized   Discharge Condition: Guarded  Diet recommendation: Regular  Filed Weights   06/20/14 0120 06/21/14 0622 06/22/14 0521  Weight: 43 kg (94 lb 12.8 oz) 41.3 kg (91 lb 0.8 oz) 43.001 kg (94 lb 12.8 oz)    History of present illness:  Please refer to admission H&P for details, but in brief,81 year old female with 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 Patient found to be in acute hypoxic respiratory failure with CT angiogram showing marked progression of right chest cancer with enlarged mass in the right lung base with invasion of the chest wall and new bilateral pleural effusion.  Hospital Course:  Acute hypoxic respiratory failure -Likely in the setting of worsening pleural effusion with underlying malignancy. CT angiogram negative for PE. Ultrasound-guided thoracentesis done on 2/17 with 380 cc turbid light yellow fluid removed and sent to the lab. -Dr. Cruzita Lederer discussed with patient's oncologist Dr. Julien Nordmann and given worsening of her tumor consulted palliative care team for goals of care discussion. -2-D echo with EF of 55-60% and no word motion abnormality, grade 1 diastolic dysfunction. -Symptoms have improved with ultrasound-guided thoracentesis. -Negative care was consulted with  extensive discussions on goals of care. Patient and her niece understand poor prognosis and lack of response to treatment. They understand that patient has had recent physical and functional decline with progression of the cancer. -She wishes to be discharged home with focus on full comfort to improve her quality of life. She does not wish to be rehospitalized and undergo further diagnostic seizures, IV hydration or artificial feeding. MOST  form was completed. -Prescribed morphine solution 5 mg by mouth/sublingual every 4 hours when necessary for pain and dyspnea. Ativan 0.5 mg by mouth every 6 hours as needed for anxiety or agitation. -[facility]ospice and palliative [facility] will be following patient at home. Home oxygen will be arranged.  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 Not responding to treatment with increasing size of the lung mass with chest wall invasion. Palliative care as outlined above. Patient's oncologist is aware and agree with the plan.   Hypothyroidism Continue Synthroid  Chronic leukocytosis  Severe protein calorie malnutrition   Code Status: DO NOT RESUSCITATE  Family Communication: Niece at bedside  Disposition Plan: Home with home hospice   Consultants:   IR  Palliative care  Oncology  Procedures:  Right-sided therapeutic thoracentesis on 2/17  Antibiotics:  None  Discharge Exam: Filed Vitals:   06/23/14 0537  BP: 119/70  Pulse: 82  Temp: 98.2 F (36.8 C)  Resp: 20    General: Elderly cachectic female in no acute distress  HEENT: No pallor, moist oral mucosa, temporal wasting, supple neck  Chest: Mildly diminished breath sounds over right lung base with fine crackles, no rhonchi or wheeze  Cardiovascular: Normal S1 and S2, no murmurs rub or gallop  GI: Soft, nondistended, nontender, bowel sounds present  Musculoskeletal, warm, no edema  CNS: Alert and oriented  Discharge Instructions    Current  Discharge Medication List    START taking these medications   Details  bisacodyl (DULCOLAX) 10 MG suppository Place 1 suppository (10 mg total) rectally daily as needed for mild constipation. Qty: 12 suppository, Refills: 0    LORazepam (ATIVAN) 0.5 MG tablet Take 1 tablet (0.5 mg total) by mouth every 6 (six) hours as needed for anxiety. Qty: 30 tablet, Refills: 0    Morphine Sulfate (MORPHINE CONCENTRATE) 10 MG/0.5ML SOLN concentrated solution Take 0.25 mLs (5 mg total) by mouth every 3 (three) hours as needed for moderate pain, severe pain or shortness of breath. Qty: 30 mL, Refills: 0      CONTINUE these medications which have NOT CHANGED   Details  albuterol (PROVENTIL HFA;VENTOLIN HFA) 108 (90 BASE) MCG/ACT inhaler Inhale 2 puffs into the lungs every 4 (four) hours as needed for shortness of breath (shortness of breath).     Cyanocobalamin (B-12) 100 MCG TABS Take 1 tablet by mouth daily.     DULoxetine (CYMBALTA) 30 MG capsule Take 1 capsule (30 mg total) by mouth daily. Qty: 30 capsule, Refills: 3    levothyroxine (SYNTHROID, LEVOTHROID) 50 MCG tablet Take 1 tablet (50 mcg total) by mouth daily before breakfast. Qty: 30 tablet, Refills: 1    prochlorperazine (COMPAZINE) 10 MG tablet Take 1 tablet (10 mg total) by mouth every 6 (six) hours as needed for nausea or vomiting. Qty: 30 tablet, Refills: 1   Associated Diagnoses: Malignant tumor, spindle cell type    zolpidem (AMBIEN) 5 MG tablet Take 1 tablet (5 mg total) by mouth at bedtime as needed for sleep. Qty: 30 tablet, Refills: 0    calcium-vitamin D (OSCAL WITH D) 500-200 MG-UNIT per tablet Take 1 tablet by mouth every other day.     cholecalciferol (VITAMIN D) 1000 UNITS tablet Take 1,000 Units by mouth daily.    clobetasol cream (TEMOVATE) 1.44 % Apply 1 application topically 2 (two) times daily.     Ferrous Sulfate (IRON) 325 (65 FE) MG TABS Take 1 tablet by mouth daily.     fish oil-omega-3 fatty acids 1000 MG  capsule Take 1 g by mouth daily.     Multiple Vitamins-Minerals (CENTRUM SILVER PO) Take 1 tablet by mouth daily.     valACYclovir (VALTREX) 500 MG tablet Take 1 tablet (500 mg total) by mouth 2 (two) times daily. Qty: 14 tablet, Refills: 0      STOP taking these medications     HYDROcodone-acetaminophen (NORCO) 5-325 MG per tablet        Allergies  Allergen Reactions  . Atarax [Hydroxyzine] Rash    States it made her break out on her lower legs.   Follow-up Information    Please follow up.   Why:  with home hospice       The results of significant diagnostics from this hospitalization (including imaging, microbiology, ancillary and laboratory) are listed below for reference.    Significant Diagnostic Studies: Dg Chest 1 View  06/21/2014   CLINICAL DATA:  Post thoracentesis  EXAM: CHEST  1 VIEW  COMPARISON:  06/19/2014  FINDINGS: Right greater than left pleural effusions, similar to prior. There is opacification of the underlying right lung, which is known to contain a large mass that erodes into the right chest wall. There is stable aortic and hilar contours post thoracentesis. No evidence of air  leak or re-expansion edema. There is chronic asymmetric pleural thickening at the right apex, neighboring lung sutures.  IMPRESSION: No indication of thoracentesis complication.   Electronically Signed   By: Monte Fantasia M.D.   On: 06/21/2014 16:13   Dg Chest 2 View  06/19/2014   CLINICAL DATA:  Weakness and shortness of breath for 3 weeks, worse in tonight. 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  EXAM: CHEST  2 VIEW  COMPARISON:  CT chest 03/29/2014 and single view of the chest 11/01/2012.  FINDINGS: There has been marked worsening in the patient's right pleural effusion and airspace opacity in the right mid and lower lung zones consistent with progressive carcinoma. There is a new small left pleural effusion and mild basilar atelectasis. Destructive change of  the sixth and seventh ribs consistent with tumor invasion is identified as seen on prior CT scan.  IMPRESSION: Marked worsening of carcinoma and associated right effusion in the right chest.  New small left pleural effusion and basilar atelectasis.   Electronically Signed   By: Inge Rise M.D.   On: 06/19/2014 21:36   Ct Angio Chest Pe W/cm &/or Wo Cm  06/20/2014   CLINICAL DATA:  History of spindle cell lung carcinoma; increased shortness of breath.  EXAM: CT ANGIOGRAPHY CHEST WITH CONTRAST  TECHNIQUE: Multidetector CT imaging of the chest was performed using the standard protocol during bolus administration of intravenous contrast. Multiplanar CT image reconstructions and MIPs were obtained to evaluate the vascular anatomy.  CONTRAST:  100 mL OMNIPAQUE IOHEXOL 350 MG/ML SOLN  COMPARISON:  CT chest, abdomen and pelvis 03/29/2014.  FINDINGS: No pulmonary embolus is identified. Since the prior study, the patient has developed moderate to moderately large bilateral pleural effusions, greater on the right. No pericardial effusion is identified. As on the prior study, the esophagus is dilated and fluid-filled.  Pleural based mass in the right chest is difficult to discretely visualized on today's examination but is estimated to measure 9.8 cm AP x 4.1 cm transverse on image 59 compared to 8.7 cm AP x 3.6 cm transverse on the prior examination. There appear to be new pleural masses on the right including one in the lower right chest on image 73 measuring 2.6 cm AP x 2.0 cm transverse.  Lungs demonstrate extensive centrilobular emphysematous disease. There is compressive atelectasis bilaterally, much worse on the right.  Visualized upper abdomen demonstrates a small volume of perisplenic ascites. Postoperative change along the stomach is noted.  Remote right rib fractures are identified. There appears to be tumor invasion in the right fourth through eighth ribs, worsened since the prior study.  Review of the MIP  images confirms the above findings.  IMPRESSION: Negative for pulmonary embolus.  Marked progression of carcinoma in the right chest with an enlarged mass in the right lung base and a new pleural-based mass identified on the right. Invasion of the chest wall with destructive change in right ribs also appears progressive.  New moderate to moderately large bilateral pleural effusions. New small volume of perisplenic ascites is also identified.  Severe emphysema.   Electronically Signed   By: Inge Rise M.D.   On: 06/20/2014 21:21   US Thoracentesis Asp Pleural Space W/img Guide  06/21/2014   INDICATION: Spindle cell cancer of the lung, dyspnea, pleural effusions. Request is made for diagnostic and therapeutic thoracentesis.  EXAM: ULTRASOUND GUIDED DIAGNOSTIC AND THERAPEUTIC LEFT THORACENTESIS  COMPARISON:  None.  MEDICATIONS: None  COMPLICATIONS: None immediate  TECHNIQUE: Informed written consent was obtained from the patient after a discussion of the risks, benefits and alternatives to treatment. A timeout was performed prior to the initiation of the procedure.  Initial ultrasound scanning demonstrates a small left pleural effusion. The lower chest was prepped and draped in the usual sterile fashion. 1% lidocaine was used for local anesthesia.  An ultrasound image was saved for documentation purposes. An 6 Fr Safe-T-Centesis catheter was introduced. The thoracentesis was performed. The catheter was removed and a dressing was applied. The patient tolerated the procedure well without immediate post procedural complication. The patient was escorted to have an upright chest radiograph.  FINDINGS: A total of approximately 380 cc's of slightly turbid, light yellow fluid was removed. Requested samples were sent to the laboratory. Limited ultrasound of posterior right chest today reveals only a trace amount of free pleural fluid. Right lower lobe space is occupied primarily by mass.  IMPRESSION: Successful  ultrasound-guided diagnostic and therapeutic left sided thoracentesis yielding 380 cc's of pleural fluid.  Read by: Rowe Robert, PA-C   Electronically Signed   By: Corrie Mckusick D.O.   On: 06/21/2014 17:01    Microbiology: Recent Results (from the past 240 hour(s))  Urine culture     Status: None   Collection Time: 06/19/14  9:52 PM  Result Value Ref Range Status   Specimen Description URINE, CLEAN CATCH  Final   Special Requests NONE  Final   Colony Count   Final    10,000 COLONIES/ML Performed at [facility]    Culture   Final    Multiple bacterial morphotypes present, none predominant. Suggest appropriate recollection if clinically indicated. Performed at [facility]    Report Status 06/20/2014 FINAL  Final  Body fluid culture     Status: None (Preliminary result)   Collection Time: 06/21/14  3:48 PM  Result Value Ref Range Status   Specimen Description THORACENTESIS  Final   Special Requests NONE  Final   Gram Stain   Final    RARE WBC PRESENT,BOTH PMN AND MONONUCLEAR NO ORGANISMS SEEN Performed at [facility]    Culture NO GROWTH Performed at [facility]   Final   Report Status PENDING  Incomplete     Labs: Basic Metabolic Panel:  Recent Labs Lab 06/19/14 2245 06/21/14 0526  NA 138 141  K 4.1 3.5  CL 106 105  CO2 25 29  GLUCOSE 66* 100*  BUN 13 12  CREATININE 0.83 0.85  CALCIUM 10.1 9.9   Liver Function Tests:  Recent Labs Lab 06/19/14 2245 06/21/14 0526  AST 24 18  ALT 9 8  ALKPHOS 130* 111  BILITOT 0.8 0.3  PROT 6.1 5.1*  ALBUMIN 2.6* 2.3*   No results for input(s): LIPASE, AMYLASE in the last 168 hours. No results for input(s): AMMONIA in the last 168 hours. CBC:  Recent Labs Lab 06/19/14 2245 06/21/14 0526  WBC 15.9* 15.4*  NEUTROABS 13.6*  --   HGB 12.6 10.3*  HCT 39.0 32.4*  MCV 96.8 96.1  PLT 270 244   Cardiac Enzymes:  Recent Labs Lab 06/20/14 0205 06/20/14 0700  TROPONINI 0.07*  0.06*   BNP: BNP (last 3 results)  Recent Labs  06/19/14 2245  BNP 696.5*    ProBNP (last 3 results) No results for input(s): PROBNP in the last 8760 hours.  CBG: No results for input(s): GLUCAP in the last 168 hours.     Signed:  Louellen Molder  [facility] 06/23/2014, 9:59 AM

## 2014-06-23 NOTE — Progress Notes (Signed)
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  2) locally advanced adenocarcinoma of the stomach status post resection in July of 2002 followed by concurrent chemoradiation under the care of Dr. Lisbeth Aguirre and Dr. Jamse Aguirre.  PRIOR THERAPY: 1) status post wedge resection of the right upper lobe under the care of Dr. Servando Snare on 09/16/2011 and the final pathology was consistent with a spindle cell carcinoma. 2) status post curative radiotherapy to the recurrent disease in the right lung under the care of Dr. Tammi Klippel completed on 01/10/2013. 3) status post stereotactic radiotherapy to the metastatic deposits in the lung completed 06/03/2013 under the care of Dr. Tammi Klippel. 4) Systemic chemotherapy with carboplatin for AUC of 5 and paclitaxel 175 mg/M2 every 3 weeks with Neulasta support. First dose 11/30/2013. Status post 3 cycles. Discontinued secondary to disease progression. 5) Systemic chemotherapy with gemcitabine 1000 mg/M2 on days 1 and 8 every 3 weeks. Status post 3 cycles discontinued secondary to disease progression.  CURRENT THERAPY: Immunotherapy with Nivolumab 3 MG/M2 every 2 weeks status post 3 cycles.   Subjective: The patient is seen and examined today. She continues to have shortness of breath with exertion as well as right-sided chest pain. She is a very pleasant 81 years old African-American female with metastatic sarcomatoid carcinoma of the lung status post several chemotherapy regimens as well as radiation and recently treated with 4 cycles of immunotherapy with Nivolumab but unfortunately the patient continues to have evidence for disease progression. She was recently admitted to [facility] with shortness of breath. Recent CT scan of the chest showed evidence for disease progression in addition to enlarged bilateral pleural effusion. She underwent right sided  thoracentesis with drainage of 380 ML of pleural fluid. She denied having any significant fever or chills. She has no nausea or vomiting.  Objective: Vital signs in last 24 hours: Temp:  [97.4 F (36.3 C)-98.8 F (37.1 C)] 98.2 F (36.8 C) (02/19 0537) Pulse Rate:  [80-85] 82 (02/19 0537) Resp:  [16-20] 20 (02/19 0537) BP: (97-119)/(58-70) 119/70 mmHg (02/19 0537) SpO2:  [95 %-100 %] 95 % (02/19 0537)  Intake/Output from previous day: 02/18 0701 - 02/19 0700 In: 360 [P.O.:360] Out: 150 [Urine:150] Intake/Output this shift:    General appearance: alert, cooperative, fatigued and no distress Resp: diminished breath sounds bilaterally and dullness to percussion bilaterally Cardio: regular rate and rhythm, S1, S2 normal, no murmur, click, rub or gallop GI: soft, non-tender; bowel sounds normal; no masses,  no organomegaly Extremities: extremities normal, atraumatic, no cyanosis or edema  Lab Results:   Recent Labs  06/21/14 0526  WBC 15.4*  HGB 10.3*  HCT 32.4*  PLT 244   BMET  Recent Labs  06/21/14 0526  NA 141  K 3.5  CL 105  CO2 29  GLUCOSE 100*  BUN 12  CREATININE 0.85  CALCIUM 9.9    Studies/Results: Dg Chest 1 View  06/21/2014   CLINICAL DATA:  Post thoracentesis  EXAM: CHEST  1 VIEW  COMPARISON:  06/19/2014  FINDINGS: Right greater than left pleural effusions, similar to prior. There is opacification of the underlying right lung, which is known to contain a large mass that erodes into the right chest wall. There is stable aortic and hilar contours post thoracentesis. No evidence of air leak or re-expansion edema. There is chronic asymmetric pleural thickening at the right apex, neighboring lung sutures.  IMPRESSION: No  indication of thoracentesis complication.   Electronically Signed   By: Monte Fantasia M.D.   On: 06/21/2014 16:13   US Thoracentesis Asp Pleural Space W/img Guide  06/21/2014   INDICATION: Spindle cell cancer of the lung, dyspnea, pleural  effusions. Request is made for diagnostic and therapeutic thoracentesis.  EXAM: ULTRASOUND GUIDED DIAGNOSTIC AND THERAPEUTIC LEFT THORACENTESIS  COMPARISON:  None.  MEDICATIONS: None  COMPLICATIONS: None immediate  TECHNIQUE: Informed written consent was obtained from the patient after a discussion of the risks, benefits and alternatives to treatment. A timeout was performed prior to the initiation of the procedure.  Initial ultrasound scanning demonstrates a small left pleural effusion. The lower chest was prepped and draped in the usual sterile fashion. 1% lidocaine was used for local anesthesia.  An ultrasound image was saved for documentation purposes. An 6 Fr Safe-T-Centesis catheter was introduced. The thoracentesis was performed. The catheter was removed and a dressing was applied. The patient tolerated the procedure well without immediate post procedural complication. The patient was escorted to have an upright chest radiograph.  FINDINGS: A total of approximately 380 cc's of slightly turbid, light yellow fluid was removed. Requested samples were sent to the laboratory. Limited ultrasound of posterior right chest today reveals only a trace amount of free pleural fluid. Right lower lobe space is occupied primarily by mass.  IMPRESSION: Successful ultrasound-guided diagnostic and therapeutic left sided thoracentesis yielding 380 cc's of pleural fluid.  Read by: Rowe Robert, PA-C   Electronically Signed   By: Corrie Mckusick D.O.   On: 06/21/2014 17:01    Medications: I have reviewed the patient's current medications.  CODE STATUS: No CODE BLUE  Assessment/Plan: 1) metastatic sarcomatoid carcinoma of the lung status post several chemotherapy regimens as well as palliative radiation to the right chest wall mass and most recently completed 4 cycles of immunotherapy with Nivolumab. Unfortunately her recent CT scan of the chest showed evidence for disease progression in addition to bilateral pleural  effusion. I had a lengthy discussion with the patient today about her condition and I strongly recommended for her to consider palliative care and hospice at this point. The patient agreed to the current plan. 2) bilateral pleural effusion, status post right thoracentesis. We'll continue to monitor for now. 3) shortness of breath secondary to progression of the lung cancer as well as bilateral pleural effusion. Palliative care at this point. 4) disposition: Home with hospice. Thank you so much for taking good care of Mrs. [name], I will continue to follow up the patient with you and assist in her management an as-needed basis.   LOS: 3 days    Jamere Stidham K. 06/23/2014

## 2014-06-23 NOTE — Progress Notes (Signed)
INITIAL NUTRITION ASSESSMENT  DOCUMENTATION CODES Per approved criteria  -Severe malnutrition in the context of chronic illness   Pt meets criteria for severe MALNUTRITION in the context of chronic illness as evidenced by severe depletion of fat and muscle mass.   INTERVENTION: -Educated pt on increasing PO intake and consumption of nutritional supplements  NUTRITION DIAGNOSIS: Inadequate oral intake related to increased needs as evidenced by estimated needs.   Goal: Pt to meet >/= 90% of estimated needs  Monitor:  -Pt going home today  Reason for Assessment: Consult per MD  81 y.o. female  Admitting Dx: SOB (shortness of breath)  ASSESSMENT: Pt with spindle cell cancer of lung. Hx of tobacco abuse, COPD, gastric cancer, anemia, dyslipidemia.    Pt reports usual body weight of 117 lbs and last weight 117 lbs about a year to a year and half ago.  Pt reports very little appetite over the past year, only able to eat a few bites of food at each meal.  Drinks one Ensure a day.  Was concerned about drinking more than one Ensure because of her diabetes.  Pt consumed 50-100% of meals during hospital stay.   Pt discharged and waiting on ambulance to pick her up.  Encouraged pt to eat a few bites of food at at time in order to eat her whole portions.  Encouraged more frequent intake of Ensure or Glucerna supplements.   Nutrition Focused Physical Exam:  Subcutaneous Fat:  Orbital Region: severe depletion Upper Arm Region: severe depletion Thoracic and Lumbar Region: n/a  Muscle:  Temple Region: severe depletion Clavicle Bone Region: severe depletion Clavicle and Acromion Bone Region: severe depletion Scapular Bone Region: n/a Dorsal Hand: severe depletion Patellar Region: severe depletion Anterior Thigh Region: severe depletion Posterior Calf Region: severe depletion  Edema: not present    Height: Ht Readings from Last 1 Encounters:  06/20/14 4\' 11"  (1.499 m)     Weight: Wt Readings from Last 1 Encounters:  06/23/14 101 lb 9.6 oz (46.085 kg)    Ideal Body Weight: 95 lbs  % Ideal Body Weight: 106%  Wt Readings from Last 10 Encounters:  06/23/14 101 lb 9.6 oz (46.085 kg)  06/14/14 93 lb 4.8 oz (42.321 kg)  06/01/14 91 lb 9.6 oz (41.549 kg)  05/17/14 80 lb 4.8 oz (36.424 kg)  05/03/14 75 lb 6.4 oz (34.201 kg)  04/17/14 76 lb 12.8 oz (34.836 kg)  04/05/14 77 lb 8 oz (35.154 kg)  03/15/14 84 lb (38.102 kg)  02/22/14 85 lb 6.4 oz (38.737 kg)  02/22/14 85 lb 12.8 oz (38.919 kg)    Usual Body Weight: 117 lbs  % Usual Body Weight: 86%  BMI:  Body mass index is 20.51 kg/(m^2).  Estimated Nutritional Needs: Kcal: 1400-1600 kcals Protein: 65-80 g protein Fluid: >/= 1.4 L/day  Skin: Stage II PU   Diet Order: Diet Heart  EDUCATION NEEDS: -No education needs identified at this time   Intake/Output Summary (Last 24 hours) at 06/23/14 1024 Last data filed at 06/23/14 0745  Gross per 24 hour  Intake    360 ml  Output    150 ml  Net    210 ml    Last BM: none charted   Labs:   Recent Labs Lab 06/19/14 2245 06/21/14 0526  NA 138 141  K 4.1 3.5  CL 106 105  CO2 25 29  BUN 13 12  CREATININE 0.83 0.85  CALCIUM 10.1 9.9  GLUCOSE 66* 100*  CBG (last 3)  No results for input(s): GLUCAP in the last 72 hours.  Scheduled Meds: . calcium-vitamin D  1 tablet Oral QODAY  . cholecalciferol  1,000 Units Oral Daily  . DULoxetine  30 mg Oral Daily  . enoxaparin (LOVENOX) injection  30 mg Subcutaneous QHS  . ferrous sulfate  325 mg Oral Daily  . levothyroxine  50 mcg Oral QAC breakfast  . omega-3 acid ethyl esters  1 g Oral Daily  . sodium chloride  3 mL Intravenous Q12H  . sodium chloride  3 mL Intravenous Q12H  . vitamin B-12  100 mcg Oral Daily    Continuous Infusions:   Past Medical History  Diagnosis Date  . Dyslipidemia   . Arthritis     Hx of osteoarthritis  . Shortness of breath     WITH EXERTION   .  COPD (chronic obstructive pulmonary disease)   . Anemia   . History of chemotherapy     carboplatin/taxol  . Lung mass   . 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 History of radiation therapy 01/13/11-02/20/11    abdomen    Past Surgical History  Procedure Laterality Date  . Esophagogastrectomy, j tube 11/08/2010    . Cholecystectomy    . Abdominal hysterectomy    . Cholecystectomy    . Cataract extraction      RIGHT EYE   . Port-a-cath removal  05/13/2011    Procedure: REMOVAL PORT-A-CATH;  Surgeon: Stark Klein, MD;  Location: [place];  Service: General;  Laterality: N/A;  Removal port-a-cath.  . Portacath placement    . Eye surgery    . Bronchoscopy  08/28/11    rt upper lung mass bx  . Cardia resected  11/2010  . Wedge resection  09/18/11    right vats Dr. Tiajuana Amass MS Dietetic Intern Pager Number [phone]

## 2014-06-23 NOTE — Progress Notes (Addendum)
Follow up:  Writer spoke with pt and niece Hinton Dyer in room; Pt alert, oriented x4 stated she felt good; Dr Clementeen Graham also present -per pt she has spoken with Dr Julien Nordmann this morning; plan is to go home today by PTAR; Dr Clementeen Graham indicated he should have things in place for discharge around noon; Probation officer discussed with staff RN Earvin Hansen, hope is for transport to be arranged for approximately 1 pm  Hinton Dyer will be speaking with [facility] for delivery of O2 package and 4 prong cane- writer spoke with [facility] representative Lecretia to request portable travel tank be delivered to room for Hinton Dyer to take home to assure pt will have O2 in the home when she arrives; travel tank should last pt 3-4 hours on [facility] continuous O2 and this will give [facility] time to arrive with delivery to the home  [facility] equipment manager to contact [employer] to request confirmation on delivery time to [name] [phone]- [phone] Please notify [facility] when PT[facility] on unit & pt ready to leave call [phone] Please send GOLD DNR Form and MOST Form on shadow chart home with pt  *Pt will need prescriptions for comfort medications as per PMT NP recommendations  Danton Sewer, RN MSN [facility] Liaison [phone]

## 2014-06-23 NOTE — Progress Notes (Signed)
CSW consulted for transportation needs. Patient will need non-emergency ambulance transport home. CSW confirmed home address with patient's niece at bedside, Hinton Dyer. PTAR called for transport. No other CSW needs identified - CSW signing off.   Raynaldo Opitz, [facility] Clinical Social Worker cell #: [phone]

## 2014-06-25 LAB — BODY FLUID CULTURE: Culture: NO GROWTH

## 2014-06-28 ENCOUNTER — Ambulatory Visit: Payer: Medicare Other

## 2014-06-28 ENCOUNTER — Ambulatory Visit: Payer: Medicare Other | Admitting: Internal Medicine

## 2014-06-28 ENCOUNTER — Other Ambulatory Visit: Payer: Medicare Other

## 2014-06-29 ENCOUNTER — Other Ambulatory Visit: Payer: Self-pay | Admitting: Medical Oncology

## 2014-06-29 ENCOUNTER — Telehealth: Payer: Self-pay | Admitting: *Deleted

## 2014-06-29 NOTE — Telephone Encounter (Signed)
Georgina Snell with [facility] called reporting "patient's niece Parks Ranger has called request Hospice provider change.  Would like order from Dr. Julien Nordmann and need H&P with face sheet and Hospice provider can be changed."  Will notify Dr. Julien Nordmann of this request.  Call back number for [facility] [phone].

## 2014-06-29 NOTE — Telephone Encounter (Signed)
I called and left Hinton Dyer a message to call me back regarding this change

## 2014-06-29 NOTE — Telephone Encounter (Signed)
I will take ownership of this issue.

## 2014-06-30 ENCOUNTER — Telehealth: Payer: Self-pay | Admitting: *Deleted

## 2014-06-30 NOTE — Telephone Encounter (Signed)
Spoke to [place] . I confirmed that she knows pt is being transferred to [place]./She said she understands.

## 2014-06-30 NOTE — Telephone Encounter (Signed)
I spoke to [facility] to proceed with transfer. I faxed hx and p and med list to [facility]. There physician to do symptom management inlcuding pain management.I told Georgina Snell that pt is having pain and needs to be seen today. He will f/u.with pt

## 2014-06-30 NOTE — Telephone Encounter (Signed)
Patient's daughter called to request a change from [place] [facility] to [facility].  She said [facility] "has not been doing what they should do," specifically she was disappointed that it took a week to have services started.

## 2014-07-03 ENCOUNTER — Telehealth: Payer: Self-pay | Admitting: *Deleted

## 2014-07-03 NOTE — Telephone Encounter (Signed)
OK 

## 2014-07-03 NOTE — Telephone Encounter (Signed)
[facility] calling for a 2 week refill on ativan 0.5 mg,  1 every 6 hours. ( 15 day supply ) note to dr [name] and his nurse diane

## 2014-07-04 ENCOUNTER — Telehealth: Payer: Self-pay | Admitting: Medical Oncology

## 2014-07-04 DIAGNOSIS — C801 Malignant (primary) neoplasm, unspecified: Secondary | ICD-10-CM

## 2014-07-04 MED ORDER — LORAZEPAM 0.5 MG PO TABS: 0.5000 mg | ORAL_TABLET | Freq: Four times a day (QID) | ORAL | Status: AC | PRN

## 2014-07-04 NOTE — Telephone Encounter (Signed)
Refill on ativan sent

## 2014-07-07 ENCOUNTER — Telehealth: Payer: Self-pay | Admitting: *Deleted

## 2014-07-07 NOTE — Telephone Encounter (Signed)
Parks Ranger called back. [phone].   Let her know that Dr. Julien Nordmann has left for today.  She said she had talked with the Hospice nurse about the feeding tube and the Hospice nurse directed her to discuss with Dr. Julien Nordmann.  Let her know he will be back in the office on Monday 07/10/14 and she is fine with waiting to talk with him until then.

## 2014-07-07 NOTE — Telephone Encounter (Signed)
Ignacia Felling, RN at 07/07/2014 2:40 PM     Status: Signed       Expand All Collapse All   Niece of Lidwina Kaner - Parks Ranger called and requested that Dr. Julien Nordmann call her back. She has some questions to discuss, specifically questions about a feeding tube. Her call back is [phone]. Parks Ranger is on the patient's ROI document. Will route to Dr. Julien Nordmann and his nurse today, Armanda Magic RN       Returned call to Parks Ranger at number given above. Female answered phone and advised she Hinton Dyer nor A[place]le )was not available. Requested he give Hinton Dyer message RN was returning her call about concerns with feeding tube. He acknowledged the request. No further questions.

## 2014-07-07 NOTE — Telephone Encounter (Signed)
Niece of Asuka Dusseau - Parks Ranger called and requested that Dr. Julien Nordmann call her back.  She has some questions to discuss, specifically questions about a feeding tube.  Her call back is [phone].  Parks Ranger is on the patient's ROI document. Will route to Dr. Julien Nordmann and his nurse today, Armanda Magic RN

## 2014-07-08 NOTE — Telephone Encounter (Signed)
I returned the call to her niece. They are requesting a feeding tube because the patient is unable to drink or eat. We will make referral on Monday to gastroenterology or interventional radiology for PEG tube placement

## 2014-07-11 ENCOUNTER — Other Ambulatory Visit: Payer: Self-pay | Admitting: Medical Oncology

## 2014-07-12 ENCOUNTER — Ambulatory Visit: Payer: Medicare Other | Admitting: Physician Assistant

## 2014-07-12 ENCOUNTER — Other Ambulatory Visit: Payer: Medicare Other

## 2014-07-12 ENCOUNTER — Ambulatory Visit: Payer: Medicare Other

## 2014-07-14 ENCOUNTER — Telehealth: Payer: Self-pay | Admitting: *Deleted

## 2014-07-14 ENCOUNTER — Other Ambulatory Visit: Payer: Self-pay | Admitting: *Deleted

## 2014-07-14 MED ORDER — MORPHINE SULFATE (CONCENTRATE) 10 MG/0.5ML PO SOLN: 5.0000 mg | ORAL | Status: AC | PRN

## 2014-07-14 NOTE — Telephone Encounter (Signed)
Email from [place] from hospice called with request for refill on Morphine.  Called placed to [facility] for additional information. Pt mottled, rattles, declining and family is concerned she will run our prior to Monday.  Hospice Rn requesting Roxinol  0.25-0.51ml 3hrs PRN Reviewed with Burnetta Sabin, PA. Refill authorized fax sent to [facility] on [address] [phone]  pt request.

## 2014-07-14 NOTE — Telephone Encounter (Signed)
Hospice nurse called stating that patient will need a refill of morphine 10mg /71ml. Prescription should be called in to [facility] on [address]. RN sent message to MD and Nurse.

## 2014-07-20 ENCOUNTER — Telehealth: Payer: Self-pay | Admitting: Internal Medicine

## 2014-07-20 NOTE — Telephone Encounter (Signed)
Received death certificate 7/35/67

## 2014-07-24 ENCOUNTER — Telehealth: Payer: Self-pay | Admitting: Internal Medicine

## 2014-07-24 NOTE — Telephone Encounter (Signed)
Received an email 07/24/14 from managed care - lead and HIM reporting patient is deceased. Chart marked accordingly.

## 2014-08-04 DEATH — deceased

## 2015-10-23 ENCOUNTER — Other Ambulatory Visit: Payer: Self-pay | Admitting: Nurse Practitioner
